# Patient Record
Sex: Male | Born: 1960
Health system: Southern US, Community
[De-identification: ages and names within clinical notes are randomized; demographics above are authoritative.]

## PROBLEM LIST (undated history)

## (undated) DIAGNOSIS — K449 Diaphragmatic hernia without obstruction or gangrene: Secondary | ICD-10-CM

## (undated) DIAGNOSIS — I2510 Atherosclerotic heart disease of native coronary artery without angina pectoris: Secondary | ICD-10-CM

## (undated) DIAGNOSIS — T18128A Food in esophagus causing other injury, initial encounter: Secondary | ICD-10-CM

## (undated) DIAGNOSIS — J189 Pneumonia, unspecified organism: Secondary | ICD-10-CM

## (undated) DIAGNOSIS — Z9889 Other specified postprocedural states: Secondary | ICD-10-CM

## (undated) DIAGNOSIS — I350 Nonrheumatic aortic (valve) stenosis: Secondary | ICD-10-CM

## (undated) DIAGNOSIS — I712 Thoracic aortic aneurysm, without rupture, unspecified: Secondary | ICD-10-CM

## (undated) DIAGNOSIS — K219 Gastro-esophageal reflux disease without esophagitis: Secondary | ICD-10-CM

## (undated) DIAGNOSIS — R112 Nausea with vomiting, unspecified: Secondary | ICD-10-CM

## (undated) HISTORY — PX: MULTIPLE TOOTH EXTRACTIONS: SHX2053

## (undated) HISTORY — PX: CHOLECYSTECTOMY: SHX55

---

## 2016-02-24 DIAGNOSIS — R21 Rash and other nonspecific skin eruption: Secondary | ICD-10-CM | POA: Diagnosis not present

## 2016-02-29 DIAGNOSIS — E669 Obesity, unspecified: Secondary | ICD-10-CM | POA: Diagnosis not present

## 2016-02-29 DIAGNOSIS — Z23 Encounter for immunization: Secondary | ICD-10-CM | POA: Diagnosis not present

## 2016-02-29 DIAGNOSIS — S81801S Unspecified open wound, right lower leg, sequela: Secondary | ICD-10-CM | POA: Diagnosis not present

## 2016-02-29 DIAGNOSIS — I358 Other nonrheumatic aortic valve disorders: Secondary | ICD-10-CM | POA: Diagnosis not present

## 2016-02-29 DIAGNOSIS — I8393 Asymptomatic varicose veins of bilateral lower extremities: Secondary | ICD-10-CM | POA: Diagnosis not present

## 2016-02-29 DIAGNOSIS — Z1389 Encounter for screening for other disorder: Secondary | ICD-10-CM | POA: Diagnosis not present

## 2016-03-03 DIAGNOSIS — I10 Essential (primary) hypertension: Secondary | ICD-10-CM | POA: Diagnosis not present

## 2016-03-10 DIAGNOSIS — R011 Cardiac murmur, unspecified: Secondary | ICD-10-CM | POA: Diagnosis not present

## 2016-03-10 DIAGNOSIS — I517 Cardiomegaly: Secondary | ICD-10-CM | POA: Diagnosis not present

## 2016-03-10 DIAGNOSIS — I082 Rheumatic disorders of both aortic and tricuspid valves: Secondary | ICD-10-CM | POA: Diagnosis not present

## 2016-05-15 DIAGNOSIS — R822 Biliuria: Secondary | ICD-10-CM | POA: Diagnosis not present

## 2016-05-15 DIAGNOSIS — I350 Nonrheumatic aortic (valve) stenosis: Secondary | ICD-10-CM | POA: Diagnosis not present

## 2016-05-15 DIAGNOSIS — R1011 Right upper quadrant pain: Secondary | ICD-10-CM | POA: Diagnosis not present

## 2016-05-15 DIAGNOSIS — Z6834 Body mass index (BMI) 34.0-34.9, adult: Secondary | ICD-10-CM | POA: Diagnosis not present

## 2016-05-22 DIAGNOSIS — K8020 Calculus of gallbladder without cholecystitis without obstruction: Secondary | ICD-10-CM | POA: Diagnosis not present

## 2016-05-22 DIAGNOSIS — K8080 Other cholelithiasis without obstruction: Secondary | ICD-10-CM | POA: Diagnosis not present

## 2016-05-22 DIAGNOSIS — N200 Calculus of kidney: Secondary | ICD-10-CM | POA: Diagnosis not present

## 2016-05-30 DIAGNOSIS — K8020 Calculus of gallbladder without cholecystitis without obstruction: Secondary | ICD-10-CM | POA: Diagnosis not present

## 2016-06-15 DIAGNOSIS — Z87891 Personal history of nicotine dependence: Secondary | ICD-10-CM

## 2016-06-15 DIAGNOSIS — F17210 Nicotine dependence, cigarettes, uncomplicated: Secondary | ICD-10-CM | POA: Diagnosis not present

## 2016-06-15 DIAGNOSIS — I350 Nonrheumatic aortic (valve) stenosis: Secondary | ICD-10-CM | POA: Diagnosis not present

## 2016-06-15 HISTORY — DX: Personal history of nicotine dependence: Z87.891

## 2016-06-16 DIAGNOSIS — Z01818 Encounter for other preprocedural examination: Secondary | ICD-10-CM | POA: Diagnosis not present

## 2016-06-16 DIAGNOSIS — F17210 Nicotine dependence, cigarettes, uncomplicated: Secondary | ICD-10-CM | POA: Diagnosis not present

## 2016-06-16 DIAGNOSIS — R079 Chest pain, unspecified: Secondary | ICD-10-CM | POA: Diagnosis not present

## 2016-06-16 DIAGNOSIS — I350 Nonrheumatic aortic (valve) stenosis: Secondary | ICD-10-CM | POA: Diagnosis not present

## 2016-06-19 DIAGNOSIS — R918 Other nonspecific abnormal finding of lung field: Secondary | ICD-10-CM | POA: Diagnosis not present

## 2016-06-19 DIAGNOSIS — I5020 Unspecified systolic (congestive) heart failure: Secondary | ICD-10-CM | POA: Diagnosis not present

## 2016-06-19 DIAGNOSIS — I350 Nonrheumatic aortic (valve) stenosis: Secondary | ICD-10-CM | POA: Diagnosis not present

## 2016-06-19 DIAGNOSIS — I517 Cardiomegaly: Secondary | ICD-10-CM | POA: Diagnosis not present

## 2016-06-19 DIAGNOSIS — Z720 Tobacco use: Secondary | ICD-10-CM | POA: Diagnosis not present

## 2016-06-19 DIAGNOSIS — K8060 Calculus of gallbladder and bile duct with cholecystitis, unspecified, without obstruction: Secondary | ICD-10-CM | POA: Diagnosis not present

## 2016-06-19 DIAGNOSIS — I2510 Atherosclerotic heart disease of native coronary artery without angina pectoris: Secondary | ICD-10-CM | POA: Diagnosis not present

## 2016-06-19 DIAGNOSIS — F17210 Nicotine dependence, cigarettes, uncomplicated: Secondary | ICD-10-CM | POA: Diagnosis not present

## 2016-06-19 DIAGNOSIS — Z79899 Other long term (current) drug therapy: Secondary | ICD-10-CM | POA: Diagnosis not present

## 2016-06-20 DIAGNOSIS — I2510 Atherosclerotic heart disease of native coronary artery without angina pectoris: Secondary | ICD-10-CM | POA: Diagnosis not present

## 2016-06-20 DIAGNOSIS — K810 Acute cholecystitis: Secondary | ICD-10-CM | POA: Diagnosis not present

## 2016-06-20 DIAGNOSIS — F17210 Nicotine dependence, cigarettes, uncomplicated: Secondary | ICD-10-CM | POA: Diagnosis not present

## 2016-06-20 DIAGNOSIS — I350 Nonrheumatic aortic (valve) stenosis: Secondary | ICD-10-CM | POA: Diagnosis not present

## 2016-06-22 DIAGNOSIS — R1013 Epigastric pain: Secondary | ICD-10-CM | POA: Diagnosis not present

## 2016-06-22 DIAGNOSIS — R1011 Right upper quadrant pain: Secondary | ICD-10-CM | POA: Diagnosis not present

## 2016-06-22 DIAGNOSIS — K8010 Calculus of gallbladder with chronic cholecystitis without obstruction: Secondary | ICD-10-CM | POA: Diagnosis not present

## 2016-06-22 DIAGNOSIS — I350 Nonrheumatic aortic (valve) stenosis: Secondary | ICD-10-CM | POA: Diagnosis not present

## 2016-06-23 DIAGNOSIS — I350 Nonrheumatic aortic (valve) stenosis: Secondary | ICD-10-CM | POA: Diagnosis not present

## 2016-06-23 DIAGNOSIS — K810 Acute cholecystitis: Secondary | ICD-10-CM | POA: Diagnosis not present

## 2016-06-23 DIAGNOSIS — K8010 Calculus of gallbladder with chronic cholecystitis without obstruction: Secondary | ICD-10-CM | POA: Diagnosis not present

## 2016-06-23 DIAGNOSIS — K219 Gastro-esophageal reflux disease without esophagitis: Secondary | ICD-10-CM | POA: Diagnosis not present

## 2016-06-23 DIAGNOSIS — K811 Chronic cholecystitis: Secondary | ICD-10-CM | POA: Diagnosis not present

## 2016-06-23 DIAGNOSIS — F17210 Nicotine dependence, cigarettes, uncomplicated: Secondary | ICD-10-CM | POA: Diagnosis not present

## 2016-06-23 DIAGNOSIS — I2510 Atherosclerotic heart disease of native coronary artery without angina pectoris: Secondary | ICD-10-CM | POA: Diagnosis not present

## 2016-07-19 DIAGNOSIS — Z6833 Body mass index (BMI) 33.0-33.9, adult: Secondary | ICD-10-CM | POA: Diagnosis not present

## 2016-07-19 DIAGNOSIS — F17210 Nicotine dependence, cigarettes, uncomplicated: Secondary | ICD-10-CM | POA: Diagnosis not present

## 2016-07-19 DIAGNOSIS — I2510 Atherosclerotic heart disease of native coronary artery without angina pectoris: Secondary | ICD-10-CM | POA: Diagnosis not present

## 2016-07-19 DIAGNOSIS — I350 Nonrheumatic aortic (valve) stenosis: Secondary | ICD-10-CM | POA: Diagnosis not present

## 2016-07-26 DIAGNOSIS — I350 Nonrheumatic aortic (valve) stenosis: Secondary | ICD-10-CM | POA: Diagnosis not present

## 2016-08-09 DIAGNOSIS — I25118 Atherosclerotic heart disease of native coronary artery with other forms of angina pectoris: Secondary | ICD-10-CM | POA: Diagnosis not present

## 2016-08-09 DIAGNOSIS — I350 Nonrheumatic aortic (valve) stenosis: Secondary | ICD-10-CM | POA: Diagnosis not present

## 2016-08-09 DIAGNOSIS — F17210 Nicotine dependence, cigarettes, uncomplicated: Secondary | ICD-10-CM | POA: Diagnosis not present

## 2016-08-09 DIAGNOSIS — Z434 Encounter for attention to other artificial openings of digestive tract: Secondary | ICD-10-CM | POA: Diagnosis not present

## 2016-08-09 DIAGNOSIS — Z6834 Body mass index (BMI) 34.0-34.9, adult: Secondary | ICD-10-CM | POA: Diagnosis not present

## 2016-08-09 DIAGNOSIS — Z7289 Other problems related to lifestyle: Secondary | ICD-10-CM | POA: Diagnosis not present

## 2016-08-09 DIAGNOSIS — K810 Acute cholecystitis: Secondary | ICD-10-CM | POA: Diagnosis not present

## 2016-08-15 DIAGNOSIS — Z01818 Encounter for other preprocedural examination: Secondary | ICD-10-CM | POA: Diagnosis not present

## 2016-08-15 DIAGNOSIS — I25118 Atherosclerotic heart disease of native coronary artery with other forms of angina pectoris: Secondary | ICD-10-CM | POA: Diagnosis not present

## 2016-08-17 DIAGNOSIS — F17210 Nicotine dependence, cigarettes, uncomplicated: Secondary | ICD-10-CM | POA: Diagnosis not present

## 2016-08-17 DIAGNOSIS — I25118 Atherosclerotic heart disease of native coronary artery with other forms of angina pectoris: Secondary | ICD-10-CM | POA: Diagnosis not present

## 2016-08-17 DIAGNOSIS — K219 Gastro-esophageal reflux disease without esophagitis: Secondary | ICD-10-CM | POA: Diagnosis not present

## 2016-08-17 DIAGNOSIS — I350 Nonrheumatic aortic (valve) stenosis: Secondary | ICD-10-CM | POA: Diagnosis not present

## 2016-08-17 DIAGNOSIS — K810 Acute cholecystitis: Secondary | ICD-10-CM | POA: Diagnosis not present

## 2016-08-17 DIAGNOSIS — I444 Left anterior fascicular block: Secondary | ICD-10-CM | POA: Diagnosis not present

## 2016-08-18 DIAGNOSIS — K219 Gastro-esophageal reflux disease without esophagitis: Secondary | ICD-10-CM | POA: Diagnosis not present

## 2016-08-18 DIAGNOSIS — K810 Acute cholecystitis: Secondary | ICD-10-CM | POA: Diagnosis not present

## 2016-08-18 DIAGNOSIS — I25118 Atherosclerotic heart disease of native coronary artery with other forms of angina pectoris: Secondary | ICD-10-CM | POA: Diagnosis not present

## 2016-08-18 DIAGNOSIS — F17210 Nicotine dependence, cigarettes, uncomplicated: Secondary | ICD-10-CM | POA: Diagnosis not present

## 2016-08-18 DIAGNOSIS — I350 Nonrheumatic aortic (valve) stenosis: Secondary | ICD-10-CM | POA: Diagnosis not present

## 2016-09-05 DIAGNOSIS — K8010 Calculus of gallbladder with chronic cholecystitis without obstruction: Secondary | ICD-10-CM | POA: Diagnosis not present

## 2016-09-05 DIAGNOSIS — K811 Chronic cholecystitis: Secondary | ICD-10-CM | POA: Diagnosis not present

## 2016-09-05 DIAGNOSIS — R1011 Right upper quadrant pain: Secondary | ICD-10-CM | POA: Diagnosis not present

## 2016-09-05 DIAGNOSIS — R1013 Epigastric pain: Secondary | ICD-10-CM | POA: Diagnosis not present

## 2016-09-06 DIAGNOSIS — K819 Cholecystitis, unspecified: Secondary | ICD-10-CM | POA: Diagnosis not present

## 2016-09-06 DIAGNOSIS — F17210 Nicotine dependence, cigarettes, uncomplicated: Secondary | ICD-10-CM | POA: Diagnosis not present

## 2016-09-06 DIAGNOSIS — I2510 Atherosclerotic heart disease of native coronary artery without angina pectoris: Secondary | ICD-10-CM | POA: Diagnosis not present

## 2016-09-06 DIAGNOSIS — I350 Nonrheumatic aortic (valve) stenosis: Secondary | ICD-10-CM | POA: Diagnosis not present

## 2016-09-08 DIAGNOSIS — T85590A Other mechanical complication of bile duct prosthesis, initial encounter: Secondary | ICD-10-CM | POA: Diagnosis not present

## 2016-09-08 DIAGNOSIS — K811 Chronic cholecystitis: Secondary | ICD-10-CM | POA: Diagnosis not present

## 2016-09-11 DIAGNOSIS — F17200 Nicotine dependence, unspecified, uncomplicated: Secondary | ICD-10-CM | POA: Diagnosis not present

## 2016-09-11 DIAGNOSIS — I358 Other nonrheumatic aortic valve disorders: Secondary | ICD-10-CM | POA: Diagnosis not present

## 2016-09-11 DIAGNOSIS — R112 Nausea with vomiting, unspecified: Secondary | ICD-10-CM | POA: Diagnosis not present

## 2016-09-11 DIAGNOSIS — R109 Unspecified abdominal pain: Secondary | ICD-10-CM | POA: Diagnosis not present

## 2016-09-11 DIAGNOSIS — I2510 Atherosclerotic heart disease of native coronary artery without angina pectoris: Secondary | ICD-10-CM | POA: Diagnosis not present

## 2016-09-11 DIAGNOSIS — J9811 Atelectasis: Secondary | ICD-10-CM | POA: Diagnosis not present

## 2016-09-11 DIAGNOSIS — Z7982 Long term (current) use of aspirin: Secondary | ICD-10-CM | POA: Diagnosis not present

## 2016-09-11 DIAGNOSIS — K219 Gastro-esophageal reflux disease without esophagitis: Secondary | ICD-10-CM | POA: Diagnosis not present

## 2016-09-11 DIAGNOSIS — K449 Diaphragmatic hernia without obstruction or gangrene: Secondary | ICD-10-CM | POA: Diagnosis not present

## 2016-09-11 DIAGNOSIS — J90 Pleural effusion, not elsewhere classified: Secondary | ICD-10-CM | POA: Diagnosis not present

## 2016-09-11 DIAGNOSIS — R1011 Right upper quadrant pain: Secondary | ICD-10-CM | POA: Diagnosis not present

## 2016-09-11 DIAGNOSIS — R0602 Shortness of breath: Secondary | ICD-10-CM | POA: Diagnosis not present

## 2016-09-11 DIAGNOSIS — Z955 Presence of coronary angioplasty implant and graft: Secondary | ICD-10-CM | POA: Diagnosis not present

## 2016-09-11 DIAGNOSIS — D649 Anemia, unspecified: Secondary | ICD-10-CM | POA: Diagnosis not present

## 2016-09-11 DIAGNOSIS — I700 Atherosclerosis of aorta: Secondary | ICD-10-CM | POA: Diagnosis not present

## 2016-09-14 DIAGNOSIS — K811 Chronic cholecystitis: Secondary | ICD-10-CM | POA: Diagnosis not present

## 2016-09-14 DIAGNOSIS — I350 Nonrheumatic aortic (valve) stenosis: Secondary | ICD-10-CM | POA: Diagnosis not present

## 2016-09-14 DIAGNOSIS — K429 Umbilical hernia without obstruction or gangrene: Secondary | ICD-10-CM | POA: Diagnosis not present

## 2016-09-14 DIAGNOSIS — R1013 Epigastric pain: Secondary | ICD-10-CM | POA: Diagnosis not present

## 2016-10-02 DIAGNOSIS — K819 Cholecystitis, unspecified: Secondary | ICD-10-CM | POA: Diagnosis not present

## 2016-10-02 DIAGNOSIS — K8050 Calculus of bile duct without cholangitis or cholecystitis without obstruction: Secondary | ICD-10-CM | POA: Diagnosis not present

## 2016-10-02 DIAGNOSIS — K8066 Calculus of gallbladder and bile duct with acute and chronic cholecystitis without obstruction: Secondary | ICD-10-CM | POA: Diagnosis not present

## 2016-10-02 DIAGNOSIS — Z951 Presence of aortocoronary bypass graft: Secondary | ICD-10-CM | POA: Diagnosis not present

## 2016-10-02 DIAGNOSIS — Z955 Presence of coronary angioplasty implant and graft: Secondary | ICD-10-CM | POA: Diagnosis not present

## 2016-10-02 DIAGNOSIS — K810 Acute cholecystitis: Secondary | ICD-10-CM | POA: Diagnosis not present

## 2016-10-02 DIAGNOSIS — K8010 Calculus of gallbladder with chronic cholecystitis without obstruction: Secondary | ICD-10-CM | POA: Diagnosis not present

## 2016-10-02 DIAGNOSIS — Z7902 Long term (current) use of antithrombotics/antiplatelets: Secondary | ICD-10-CM | POA: Diagnosis not present

## 2016-10-02 DIAGNOSIS — I2510 Atherosclerotic heart disease of native coronary artery without angina pectoris: Secondary | ICD-10-CM | POA: Diagnosis not present

## 2016-10-02 DIAGNOSIS — R9431 Abnormal electrocardiogram [ECG] [EKG]: Secondary | ICD-10-CM | POA: Diagnosis not present

## 2016-10-02 DIAGNOSIS — K8060 Calculus of gallbladder and bile duct with cholecystitis, unspecified, without obstruction: Secondary | ICD-10-CM | POA: Diagnosis not present

## 2016-10-02 DIAGNOSIS — I350 Nonrheumatic aortic (valve) stenosis: Secondary | ICD-10-CM | POA: Diagnosis not present

## 2016-10-02 DIAGNOSIS — Z716 Tobacco abuse counseling: Secondary | ICD-10-CM | POA: Diagnosis not present

## 2016-10-02 DIAGNOSIS — K219 Gastro-esophageal reflux disease without esophagitis: Secondary | ICD-10-CM | POA: Diagnosis not present

## 2016-10-02 DIAGNOSIS — F17210 Nicotine dependence, cigarettes, uncomplicated: Secondary | ICD-10-CM | POA: Diagnosis not present

## 2016-10-02 DIAGNOSIS — Z7982 Long term (current) use of aspirin: Secondary | ICD-10-CM | POA: Diagnosis not present

## 2016-10-03 DIAGNOSIS — Z955 Presence of coronary angioplasty implant and graft: Secondary | ICD-10-CM | POA: Diagnosis not present

## 2016-10-03 DIAGNOSIS — I350 Nonrheumatic aortic (valve) stenosis: Secondary | ICD-10-CM | POA: Diagnosis not present

## 2016-10-03 DIAGNOSIS — K819 Cholecystitis, unspecified: Secondary | ICD-10-CM | POA: Diagnosis not present

## 2016-10-03 DIAGNOSIS — F17210 Nicotine dependence, cigarettes, uncomplicated: Secondary | ICD-10-CM | POA: Diagnosis not present

## 2016-10-03 DIAGNOSIS — I2510 Atherosclerotic heart disease of native coronary artery without angina pectoris: Secondary | ICD-10-CM | POA: Diagnosis not present

## 2016-10-03 DIAGNOSIS — K8066 Calculus of gallbladder and bile duct with acute and chronic cholecystitis without obstruction: Secondary | ICD-10-CM | POA: Diagnosis not present

## 2016-10-04 DIAGNOSIS — I350 Nonrheumatic aortic (valve) stenosis: Secondary | ICD-10-CM | POA: Diagnosis not present

## 2016-10-04 DIAGNOSIS — I2510 Atherosclerotic heart disease of native coronary artery without angina pectoris: Secondary | ICD-10-CM | POA: Diagnosis not present

## 2016-10-04 DIAGNOSIS — F17210 Nicotine dependence, cigarettes, uncomplicated: Secondary | ICD-10-CM | POA: Diagnosis not present

## 2016-10-04 DIAGNOSIS — K819 Cholecystitis, unspecified: Secondary | ICD-10-CM | POA: Diagnosis not present

## 2016-10-24 DIAGNOSIS — K810 Acute cholecystitis: Secondary | ICD-10-CM | POA: Diagnosis not present

## 2016-10-27 DIAGNOSIS — K8040 Calculus of bile duct with cholecystitis, unspecified, without obstruction: Secondary | ICD-10-CM | POA: Diagnosis not present

## 2016-11-01 DIAGNOSIS — J90 Pleural effusion, not elsewhere classified: Secondary | ICD-10-CM | POA: Diagnosis not present

## 2016-11-01 DIAGNOSIS — K8050 Calculus of bile duct without cholangitis or cholecystitis without obstruction: Secondary | ICD-10-CM | POA: Diagnosis not present

## 2016-11-01 DIAGNOSIS — K8040 Calculus of bile duct with cholecystitis, unspecified, without obstruction: Secondary | ICD-10-CM | POA: Diagnosis not present

## 2016-11-01 DIAGNOSIS — M4185 Other forms of scoliosis, thoracolumbar region: Secondary | ICD-10-CM | POA: Diagnosis not present

## 2016-11-13 DIAGNOSIS — I350 Nonrheumatic aortic (valve) stenosis: Secondary | ICD-10-CM | POA: Diagnosis not present

## 2016-11-13 DIAGNOSIS — J189 Pneumonia, unspecified organism: Secondary | ICD-10-CM | POA: Diagnosis not present

## 2016-11-13 DIAGNOSIS — Z6832 Body mass index (BMI) 32.0-32.9, adult: Secondary | ICD-10-CM | POA: Diagnosis not present

## 2017-09-26 ENCOUNTER — Telehealth: Payer: Self-pay | Admitting: Cardiology

## 2017-09-26 ENCOUNTER — Other Ambulatory Visit: Payer: Self-pay

## 2017-09-26 MED ORDER — CLOPIDOGREL BISULFATE 75 MG PO TABS: 75.0000 mg | ORAL_TABLET | Freq: Every day | ORAL | 0 refills | Status: DC

## 2017-09-26 MED ORDER — CLOPIDOGREL BISULFATE 75 MG PO TABS: 75.0000 mg | ORAL_TABLET | Freq: Every day | ORAL | 1 refills | Status: DC

## 2017-09-26 NOTE — Telephone Encounter (Signed)
Patient was last seen over a year ago but needs refills. He needs his metoprolol 25mg  and clopidrogel 75mg  filled at [facility] in [place]. He has an appt set up for 2/20

## 2017-09-26 NOTE — Telephone Encounter (Signed)
Med refill sent

## 2017-09-27 ENCOUNTER — Other Ambulatory Visit: Payer: Self-pay

## 2017-09-27 MED ORDER — METOPROLOL TARTRATE 25 MG PO TABS: 25.0000 mg | ORAL_TABLET | Freq: Two times a day (BID) | ORAL | 1 refills | Status: DC

## 2017-10-03 ENCOUNTER — Other Ambulatory Visit: Payer: Self-pay

## 2017-10-03 ENCOUNTER — Ambulatory Visit (INDEPENDENT_AMBULATORY_CARE_PROVIDER_SITE_OTHER): Payer: BLUE CROSS/BLUE SHIELD | Admitting: Cardiology

## 2017-10-03 ENCOUNTER — Encounter: Payer: Self-pay | Admitting: Cardiology

## 2017-10-03 VITALS — BP 124/82 | HR 76 | Ht 70.0 in | Wt 232.0 lb

## 2017-10-03 DIAGNOSIS — I712 Thoracic aortic aneurysm, without rupture, unspecified: Secondary | ICD-10-CM

## 2017-10-03 DIAGNOSIS — I2510 Atherosclerotic heart disease of native coronary artery without angina pectoris: Secondary | ICD-10-CM

## 2017-10-03 DIAGNOSIS — I350 Nonrheumatic aortic (valve) stenosis: Secondary | ICD-10-CM

## 2017-10-03 DIAGNOSIS — K219 Gastro-esophageal reflux disease without esophagitis: Secondary | ICD-10-CM | POA: Insufficient documentation

## 2017-10-03 DIAGNOSIS — F17210 Nicotine dependence, cigarettes, uncomplicated: Secondary | ICD-10-CM | POA: Diagnosis not present

## 2017-10-03 DIAGNOSIS — K819 Cholecystitis, unspecified: Secondary | ICD-10-CM | POA: Insufficient documentation

## 2017-10-03 MED ORDER — OMEPRAZOLE 40 MG PO CPDR: 40.0000 mg | DELAYED_RELEASE_CAPSULE | Freq: Every day | ORAL | 0 refills | Status: DC

## 2017-10-03 NOTE — Patient Instructions (Signed)
Medication Instructions:  Your physician recommends that you continue on your current medications as directed. Please refer to the Current Medication list given to you today.  Labwork: Your physician recommends that you return for lab work in: same day as your echo. Come fasting. BMP, CBC, TSH, LFT, Lipid  Testing/Procedures: You had an EKG today.  Your physician has requested that you have an echocardiogram. Echocardiography is a painless test that uses sound waves to create images of your heart. It provides your doctor with information about the size and shape of your heart and how well your heart's chambers and valves are working. This procedure takes approximately one hour. There are no restrictions for this procedure.  Non-Cardiac CT scanning, (CAT scanning), is a noninvasive, special x-ray that produces cross-sectional images of the body using x-rays and a computer. CT scans help physicians diagnose and treat medical conditions. For some CT exams, a contrast material is used to enhance visibility in the area of the body being studied. CT scans provide greater clarity and reveal more details than regular x-ray exams.  Follow-Up: Your physician recommends that you schedule a follow-up appointment in: 1 month.  Any Other Special Instructions Will Be Listed Below (If Applicable).     If you need a refill on your cardiac medications before your next appointment, please call your pharmacy.

## 2017-10-03 NOTE — Progress Notes (Signed)
Cardiology Office Note:    Date:  10/03/2017   ID:  Tyler RichterDavid Kemp, DOB 03/06/1961, MRN [id number]  PCP:  P[facility]  Cardiologist:  Garwin Brothersajan R Tryston Gilliam, MD   Referring MD: [facility], [facility]    ASSESSMENT:    1. Thoracic aortic aneurysm without rupture (HCC)   2. Coronary artery disease involving native coronary artery of native heart without angina pectoris   3. Cigarette smoker   4. Severe aortic stenosis    PLAN:    In order of problems listed above:  1. Secondary prevention stressed with the patient.  Importance of compliance with diet and medications stressed and he vocalized understanding.  Compliance with follow-up visits first. 2. It is unclear to me as to what his lipids are fine we will check him when he comes back in the next few days fasting. 3. Clinically it appears his aortic stenosis is asymptomatic however I am not sure how much to rely on his history and therefore we will obtain an echocardiogram and get another assessment of the aortic valve. 4. I would also do a CT scan 12 to again redefine aortic pathology of the aneurysm and its issues so we can manage that appropriately. 5. It is unclear to me at this time but is not on statin therapy and he needs it from aneurysm and CAD standpoint. 6. He will be seen in follow-up appointment in a month or earlier if he has any concerns.  I told him to establish with primary care physician and he is agreeable.   Medication Adjustments/Labs and Tests Ordered: Current medicines are reviewed at length with the patient today.  Concerns regarding medicines are outlined above.  Orders Placed This Encounter  Procedures  . Basic metabolic panel  . CBC  . TSH  . Hepatic function panel  . Lipid Profile  . ECHOCARDIOGRAM COMPLETE   Meds ordered this encounter  Medications  . omeprazole (PRILOSEC) 40 MG capsule    Sig: Take 1 capsule (40 mg total) by mouth daily.    Dispense:  30 capsule    Refill:  0       History of Present Illness:    Tyler Kemp is a 57 y.o. male who is being seen today for the evaluation of aortic stenosis.  The patient has significant aortic stenosis and coronary artery disease.  In December 27 he underwent coronary stenting.  Subsequently he is done fine.  He leads a sedentary lifestyle.  No chest pain orthopnea or PND.  No chest pain or syncope or any shortness of breath on exertion.  He denies any chest pain.  No anginal-like symptoms.  At the time of my evaluation, the patient is alert awake oriented and in no distress.  He has not seen a doctor for greater than 14 months.  History reviewed. No pertinent past medical history.  History reviewed. No pertinent surgical history.  Current Medications: Current Meds  Medication Sig  . aspirin 81 MG chewable tablet   . clopidogrel (PLAVIX) 75 MG tablet Take 1 tablet (75 mg total) by mouth daily.  . metoprolol succinate (TOPROL-XL) 25 MG 24 hr tablet   . metoprolol tartrate (LOPRESSOR) 25 MG tablet Take 1 tablet (25 mg total) by mouth 2 (two) times daily.  Marland Kitchen. omeprazole (PRILOSEC) 40 MG capsule Take 1 capsule (40 mg total) by mouth daily.  . [DISCONTINUED] omeprazole (PRILOSEC) 40 MG capsule Take 40 mg by mouth.     Allergies:   Patient has no  known allergies.   Social History   Socioeconomic History  . Marital status: Unknown    Spouse name: None  . Number of children: None  . Years of education: None  . Highest education level: None  Social Needs  . Financial resource strain: None  . Food insecurity - worry: None  . Food insecurity - inability: None  . Transportation needs - medical: None  . Transportation needs - non-medical: None  Occupational History  . None  Tobacco Use  . Smoking status: Current Some Day Smoker  . Smokeless tobacco: Current User  Substance and Sexual Activity  . Alcohol use: None  . Drug use: None  . Sexual activity: None  Other Topics Concern  . None  Social History  Narrative  . None     Family History: The patient's family history is not on file.  ROS:   Please see the history of present illness.    All other systems reviewed and are negative.  EKGs/Labs/Other Studies Reviewed:    The following studies were reviewed today: I discussed my findings with the patient at extensive length.  EKG reveals sinus rhythm with ST changes   Recent Labs: No results found for requested labs within last 8760 hours.  Recent Lipid Panel No results found for: CHOL, TRIG, HDL, CHOLHDL, VLDL, LDLCALC, LDLDIRECT  Physical Exam:    VS:  BP 124/82 (BP Location: Right Arm, Patient Position: Sitting, Cuff Size: Normal)   Pulse 76   Ht 5\' 10"  (1.778 m)   Wt 232 lb (105.2 kg)   SpO2 99%   BMI 33.29 kg/m     Wt Readings from Last 3 Encounters:  10/03/17 232 lb (105.2 kg)     GEN: Patient is in no acute distress HEENT: Normal NECK: No JVD; No carotid bruits LYMPHATICS: No lymphadenopathy CARDIAC: S1 S2 regular, 2/6 systolic murmur at the apex and 3/6 systolic murmur at the aortic area. RESPIRATORY:  Clear to auscultation without rales, wheezing or rhonchi  ABDOMEN: Soft, non-tender, non-distended MUSCULOSKELETAL:  No edema; No deformity  SKIN: Warm and dry NEUROLOGIC:  Alert and oriented x 3 PSYCHIATRIC:  Normal affect    Signed, Garwin Brothers, MD  10/03/2017 3:39 PM    [facility]

## 2017-10-05 ENCOUNTER — Telehealth: Payer: Self-pay

## 2017-10-05 NOTE — Telephone Encounter (Signed)
Informed patient to have his blood work completed 3-4 days before chest CT to ensure proper kidney function. Patient was agreeable.

## 2017-10-30 ENCOUNTER — Ambulatory Visit (HOSPITAL_BASED_OUTPATIENT_CLINIC_OR_DEPARTMENT_OTHER): Admission: RE | Admit: 2017-10-30 | Payer: BLUE CROSS/BLUE SHIELD | Source: Ambulatory Visit

## 2017-10-30 ENCOUNTER — Ambulatory Visit (HOSPITAL_BASED_OUTPATIENT_CLINIC_OR_DEPARTMENT_OTHER): Payer: BLUE CROSS/BLUE SHIELD

## 2017-11-20 DIAGNOSIS — I350 Nonrheumatic aortic (valve) stenosis: Secondary | ICD-10-CM | POA: Diagnosis not present

## 2017-11-20 DIAGNOSIS — I712 Thoracic aortic aneurysm, without rupture: Secondary | ICD-10-CM | POA: Diagnosis not present

## 2017-11-20 DIAGNOSIS — I2510 Atherosclerotic heart disease of native coronary artery without angina pectoris: Secondary | ICD-10-CM | POA: Diagnosis not present

## 2017-11-21 LAB — BASIC METABOLIC PANEL
BUN / CREAT RATIO: 18 (ref 9–20)
BUN: 18 mg/dL (ref 6–24)
CO2: 23 mmol/L (ref 20–29)
CREATININE: 1.01 mg/dL (ref 0.76–1.27)
Calcium: 9.7 mg/dL (ref 8.7–10.2)
Chloride: 99 mmol/L (ref 96–106)
GFR calc Af Amer: 94 mL/min/{1.73_m2} (ref >59)
GFR, EST NON AFRICAN AMERICAN: 82 mL/min/{1.73_m2} (ref >59)
GLUCOSE: 76 mg/dL (ref 65–99)
POTASSIUM: 4.9 mmol/L (ref 3.5–5.2)
SODIUM: 137 mmol/L (ref 134–144)

## 2017-11-21 LAB — CBC
Hematocrit: 41.3 % (ref 37.5–51.0)
Hemoglobin: 13.6 g/dL (ref 13.0–17.7)
MCH: 28.5 pg (ref 26.6–33.0)
MCHC: 32.9 g/dL (ref 31.5–35.7)
MCV: 86 fL (ref 79–97)
PLATELETS: 203 10*3/uL (ref 150–379)
RBC: 4.78 x10E6/uL (ref 4.14–5.80)
RDW: 14.1 % (ref 12.3–15.4)
WBC: 8.3 10*3/uL (ref 3.4–10.8)

## 2017-11-21 LAB — HEPATIC FUNCTION PANEL
ALK PHOS: 66 IU/L (ref 39–117)
ALT: 21 IU/L (ref 0–44)
AST: 25 IU/L (ref 0–40)
Albumin: 4.3 g/dL (ref 3.5–5.5)
BILIRUBIN TOTAL: 0.3 mg/dL (ref 0.0–1.2)
BILIRUBIN, DIRECT: 0.1 mg/dL (ref 0.00–0.40)
TOTAL PROTEIN: 6.9 g/dL (ref 6.0–8.5)

## 2017-11-21 LAB — LIPID PANEL
CHOLESTEROL TOTAL: 223 mg/dL — AB (ref 100–199)
Chol/HDL Ratio: 4.2 ratio (ref 0.0–5.0)
HDL: 53 mg/dL (ref >39)
LDL CALC: 118 mg/dL — AB (ref 0–99)
Triglycerides: 260 mg/dL — ABNORMAL HIGH (ref 0–149)
VLDL Cholesterol Cal: 52 mg/dL — ABNORMAL HIGH (ref 5–40)

## 2017-11-21 LAB — TSH: TSH: 1.89 u[IU]/mL (ref 0.450–4.500)

## 2017-11-22 ENCOUNTER — Ambulatory Visit (HOSPITAL_BASED_OUTPATIENT_CLINIC_OR_DEPARTMENT_OTHER)
Admission: RE | Admit: 2017-11-22 | Discharge: 2017-11-22 | Disposition: A | Discharge status: Home / Self Care | Payer: BLUE CROSS/BLUE SHIELD | Source: Ambulatory Visit | Attending: Cardiology | Admitting: Cardiology

## 2017-11-22 ENCOUNTER — Encounter (HOSPITAL_BASED_OUTPATIENT_CLINIC_OR_DEPARTMENT_OTHER): Payer: Self-pay

## 2017-11-22 DIAGNOSIS — I350 Nonrheumatic aortic (valve) stenosis: Secondary | ICD-10-CM | POA: Diagnosis not present

## 2017-11-22 DIAGNOSIS — I700 Atherosclerosis of aorta: Secondary | ICD-10-CM | POA: Insufficient documentation

## 2017-11-22 DIAGNOSIS — K449 Diaphragmatic hernia without obstruction or gangrene: Secondary | ICD-10-CM | POA: Diagnosis not present

## 2017-11-22 DIAGNOSIS — I2510 Atherosclerotic heart disease of native coronary artery without angina pectoris: Secondary | ICD-10-CM

## 2017-11-22 DIAGNOSIS — I712 Thoracic aortic aneurysm, without rupture, unspecified: Secondary | ICD-10-CM

## 2017-11-22 DIAGNOSIS — Z720 Tobacco use: Secondary | ICD-10-CM | POA: Diagnosis not present

## 2017-11-22 MED ORDER — IOPAMIDOL (ISOVUE-370) INJECTION 76%
100.0000 mL | Freq: Once | INTRAVENOUS | Status: AC | PRN
  Administered 2017-11-22: 100 mL via INTRAVENOUS

## 2017-11-22 NOTE — Progress Notes (Signed)
  Echocardiogram 2D Echocardiogram has been performed.  Dorothey BasemanReel, Enora Trillo M 11/22/2017, 3:57 PM

## 2017-11-23 ENCOUNTER — Other Ambulatory Visit: Payer: Self-pay

## 2017-11-23 DIAGNOSIS — I2510 Atherosclerotic heart disease of native coronary artery without angina pectoris: Secondary | ICD-10-CM

## 2017-11-23 MED ORDER — ATORVASTATIN CALCIUM 10 MG PO TABS: 10.0000 mg | ORAL_TABLET | Freq: Every day | ORAL | 2 refills | Status: DC

## 2017-11-26 ENCOUNTER — Encounter: Payer: Self-pay | Admitting: Cardiology

## 2017-11-26 ENCOUNTER — Ambulatory Visit (INDEPENDENT_AMBULATORY_CARE_PROVIDER_SITE_OTHER): Payer: BLUE CROSS/BLUE SHIELD | Admitting: Cardiology

## 2017-11-26 VITALS — BP 122/76 | HR 93 | Ht 70.0 in | Wt 230.8 lb

## 2017-11-26 DIAGNOSIS — I2510 Atherosclerotic heart disease of native coronary artery without angina pectoris: Secondary | ICD-10-CM | POA: Diagnosis not present

## 2017-11-26 DIAGNOSIS — I350 Nonrheumatic aortic (valve) stenosis: Secondary | ICD-10-CM

## 2017-11-26 DIAGNOSIS — I712 Thoracic aortic aneurysm, without rupture, unspecified: Secondary | ICD-10-CM

## 2017-11-26 DIAGNOSIS — Z01810 Encounter for preprocedural cardiovascular examination: Secondary | ICD-10-CM | POA: Diagnosis not present

## 2017-11-26 DIAGNOSIS — Z87891 Personal history of nicotine dependence: Secondary | ICD-10-CM | POA: Diagnosis not present

## 2017-11-26 NOTE — Patient Instructions (Signed)
Medication Instructions:  Your physician recommends that you continue on your current medications as directed. Please refer to the Current Medication list given to you today.  Labwork: Your physician recommends that you have the following labs drawn: BMP, CBC, pt/inr  Testing/Procedures: You had an EKG today.  Your physician has requested that you have a cardiac catheterization. Cardiac catheterization is used to diagnose and/or treat various heart conditions. Doctors may recommend this procedure for a number of different reasons. The most common reason is to evaluate chest pain. Chest pain can be a symptom of coronary artery disease (CAD), and cardiac catheterization can show whether plaque is narrowing or blocking your heart's arteries. This procedure is also used to evaluate the valves, as well as measure the blood flow and oxygen levels in different parts of your heart. For further information please visit [email or web address] Please follow instruction sheet, as given.    [facility] [address], [address] [place] [address] Dept: [phone] Loc: [phone]  Remonia RichterDavid Lubben  11/26/2017  You are scheduled for a Cardiac Catheterization on Tuesday, April 23 with Dr. Verdis PrimeHenry Smith.  1. Please arrive at the [facility] A[facility]: [address] [place], [place] [address] at 8:30 AM (two hours before your procedure to ensure your preparation). Free valet parking service is available.   Special note: Every effort is made to have your procedure done on time. Please understand that emergencies sometimes delay scheduled procedures.  2. Diet: Do not eat or drink anything after midnight prior to your procedure except sips of water to take medications.  3. Labs: None needed.  4. Medication instructions in preparation for your procedure:  On the morning of your procedure, take  your Aspirin and Plavix and any morning medicines NOT listed above.  You may use sips of water.  5. Plan for one night stay--bring personal belongings. 6. Bring a current list of your medications and current insurance cards. 7. You MUST have a responsible person to drive you home. 8. Someone MUST be with you the first 24 hours after you arrive home or your discharge will be delayed. 9. Please wear clothes that are easy to get on and off and wear slip-on shoes.  Thank you for allowing us to care for you!   --  Invasive Cardiovascular services   Follow-Up: Your physician recommends that you schedule a follow-up appointment in: 4 weeks.  Any Other Special Instructions Will Be Listed Below (If Applicable).     If you need a refill on your cardiac medications before your next appointment, please call your pharmacy.

## 2017-11-26 NOTE — Progress Notes (Signed)
Cardiology Office Note:    Date:  11/26/2017   ID:  Tyler Richteravid Filsinger, DOB 12/21/1960, MRN [id number]  PCP:  [facility]  Cardiologist:  Garwin Brothersajan R Grove Defina, MD   Referring MD: [facility]    ASSESSMENT:    1. Severe aortic stenosis   2. Thoracic aortic aneurysm without rupture (HCC)   3. Coronary artery disease involving native coronary artery of native heart without angina pectoris   4. Ex-smoker    PLAN:    In order of problems listed above:  1. Secondary prevention stressed with the patient.  Importance of compliance with diet and medications stressed and he vocalized understanding. 2. The patient has symptoms which are very significant and also has severe aortic stenosis.  He also has aortic aneurysm and he will need to be evaluated by coronary angiography and aortography.  This will be in preparation for aortic valve replacement.I discussed coronary angiography and left heart catheterization with the patient at extensive length. Procedure, benefits and potential risks were explained. Patient had multiple questions which were answered to the patient's satisfaction. Patient agreed and consented for the procedure. Further recommendations will be made based on the findings of the coronary angiography. In the interim. The patient has any significant symptoms he knows to go to the nearest emergency room. 3. The above were discussed with the patient at extensive length and questions were answered to his satisfaction.  Further recommendations will be made by our interventional colleague after the aforementioned tests are complete.   Medication Adjustments/Labs and Tests Ordered: Current medicines are reviewed at length with the patient today.  Concerns regarding medicines are outlined above.  No orders of the defined types were placed in this encounter.  No orders of the defined types were placed in this encounter.    Chief Complaint  Patient presents with   . Abnormal Echo     History of Present Illness:    Tyler Kemp is a 57 y.o. male.  The patient has been evaluated by me in the past.  He has coronary artery disease post stenting more than a year ago, the patient has aortic aneurysm and also severe aortic stenosis by recent echocardiogram.  He leads a sedentary lifestyle.  He denies any history of chest pain or shortness of breath on exertion or syncope.  Again he tries to not do much.  He however states that if he tries to do any more than usual he has some tightness in the chest.  His daughter-in-law is with him today.  At the time of my evaluation, the patient is alert awake oriented and in no distress.  History reviewed. No pertinent past medical history.  History reviewed. No pertinent surgical history.  Current Medications: Current Meds  Medication Sig  . aspirin 81 MG chewable tablet   . atorvastatin (LIPITOR) 10 MG tablet Take 1 tablet (10 mg total) by mouth daily at 6 PM.  . clopidogrel (PLAVIX) 75 MG tablet Take 1 tablet (75 mg total) by mouth daily.  . metoprolol succinate (TOPROL-XL) 25 MG 24 hr tablet   . metoprolol tartrate (LOPRESSOR) 25 MG tablet Take 1 tablet (25 mg total) by mouth 2 (two) times daily.  Marland Kitchen. omeprazole (PRILOSEC) 40 MG capsule Take 1 capsule (40 mg total) by mouth daily.     Allergies:   Patient has no known allergies.   Social History   Socioeconomic History  . Marital status: Single    Spouse name: Not on file  .  Number of children: Not on file  . Years of education: Not on file  . Highest education level: Not on file  Occupational History  . Not on file  Social Needs  . Financial resource strain: Not on file  . Food insecurity:    Worry: Not on file    Inability: Not on file  . Transportation needs:    Medical: Not on file    Non-medical: Not on file  Tobacco Use  . Smoking status: Current Some Day Smoker  . Smokeless tobacco: Current User  Substance and Sexual Activity  . Alcohol  use: Not on file  . Drug use: Not on file  . Sexual activity: Not on file  Lifestyle  . Physical activity:    Days per week: Not on file    Minutes per session: Not on file  . Stress: Not on file  Relationships  . Social connections:    Talks on phone: Not on file    Gets together: Not on file    Attends religious service: Not on file    Active member of club or organization: Not on file    Attends meetings of clubs or organizations: Not on file    Relationship status: Not on file  Other Topics Concern  . Not on file  Social History Narrative  . Not on file     Family History: The patient's family history is not on file.  ROS:   Please see the history of present illness.    All other systems reviewed and are negative.  EKGs/Labs/Other Studies Reviewed:    The following studies were reviewed today: I discussed the findings of the echo with the patient at extensive length.  EKG reveals sinus rhythm with nonspecific ST-T changes.   Recent Labs: 11/20/2017: ALT 21; BUN 18; Creatinine, Ser 1.01; Hemoglobin 13.6; Platelets 203; Potassium 4.9; Sodium 137; TSH 1.890  Recent Lipid Panel    Component Value Date/Time   CHOL 223 (H) 11/20/2017 1507   TRIG 260 (H) 11/20/2017 1507   HDL 53 11/20/2017 1507   CHOLHDL 4.2 11/20/2017 1507   LDLCALC 118 (H) 11/20/2017 1507    Physical Exam:    VS:  BP 122/76 (BP Location: Left Arm, Patient Position: Sitting, Cuff Size: Normal)   Pulse 93   Ht 5\' 10"  (1.778 m)   Wt 230 lb 12.8 oz (104.7 kg)   SpO2 94%   BMI 33.12 kg/m     Wt Readings from Last 3 Encounters:  11/26/17 230 lb 12.8 oz (104.7 kg)  10/03/17 232 lb (105.2 kg)     GEN: Patient is in no acute distress HEENT: Normal NECK: No JVD; No carotid bruits LYMPHATICS: No lymphadenopathy CARDIAC: Hear sounds regular, 2/6 systolic murmur at the apex. RESPIRATORY:  Clear to auscultation without rales, wheezing or rhonchi  ABDOMEN: Soft, non-tender,  non-distended MUSCULOSKELETAL:  No edema; No deformity  SKIN: Warm and dry NEUROLOGIC:  Alert and oriented x 3 PSYCHIATRIC:  Normal affect   Signed, Garwin Brothers, MD  11/26/2017 4:26 PM    [facility]

## 2017-11-27 LAB — BASIC METABOLIC PANEL
BUN/Creatinine Ratio: 13 (ref 9–20)
BUN: 14 mg/dL (ref 6–24)
CALCIUM: 9.4 mg/dL (ref 8.7–10.2)
CHLORIDE: 102 mmol/L (ref 96–106)
CO2: 24 mmol/L (ref 20–29)
Creatinine, Ser: 1.09 mg/dL (ref 0.76–1.27)
GFR calc non Af Amer: 74 mL/min/{1.73_m2} (ref >59)
GFR, EST AFRICAN AMERICAN: 86 mL/min/{1.73_m2} (ref >59)
Glucose: 87 mg/dL (ref 65–99)
POTASSIUM: 4.9 mmol/L (ref 3.5–5.2)
Sodium: 138 mmol/L (ref 134–144)

## 2017-11-27 LAB — CBC
HEMATOCRIT: 39.4 % (ref 37.5–51.0)
HEMOGLOBIN: 12.9 g/dL — AB (ref 13.0–17.7)
MCH: 27.8 pg (ref 26.6–33.0)
MCHC: 32.7 g/dL (ref 31.5–35.7)
MCV: 85 fL (ref 79–97)
Platelets: 210 10*3/uL (ref 150–379)
RBC: 4.64 x10E6/uL (ref 4.14–5.80)
RDW: 13.8 % (ref 12.3–15.4)
WBC: 8.1 10*3/uL (ref 3.4–10.8)

## 2017-11-27 LAB — PROTIME-INR
INR: 0.9 (ref 0.8–1.2)
PROTHROMBIN TIME: 9.6 s (ref 9.1–12.0)

## 2017-11-29 ENCOUNTER — Telehealth: Payer: Self-pay

## 2017-11-29 NOTE — Telephone Encounter (Signed)
Attempted to reach the patient; have attempted daily since the result was received. Will try again later and if no answer will leave a voicemail.

## 2017-11-29 NOTE — Telephone Encounter (Signed)
-----   Message from Garwin Brothersajan R Revankar, MD sent at 11/27/2017  8:49 AM EDT ----- The results of the study is unremarkable. Please inform patient. I will discuss in detail at next appointment. Cc  primary care/referring physician Garwin Brothersajan R Revankar, MD 11/27/2017 8:49 AM

## 2017-12-03 ENCOUNTER — Telehealth: Payer: Self-pay | Admitting: *Deleted

## 2017-12-03 NOTE — Telephone Encounter (Addendum)
[facility] for: Tuesday April 23,2019 10:30 AM Verify arrival time and place: [facility] Main Entrance A/North Tower at: 8 AM Nothing to eat or drink after midnight.  AM meds can be  taken pre-cath with sip of water including: ASA 81 mg  Plavix 75 mg  Patient has responsible person to drive home post procedure and observe patient for 24 hours  No answer at phone number listed. Voice mail not set up, do not see alternate number.

## 2017-12-04 ENCOUNTER — Ambulatory Visit (HOSPITAL_COMMUNITY)
Admission: RE | Admit: 2017-12-04 | Discharge: 2017-12-04 | Disposition: A | Discharge status: Home / Self Care | Payer: BLUE CROSS/BLUE SHIELD | Source: Ambulatory Visit | Attending: Interventional Cardiology | Admitting: Interventional Cardiology

## 2017-12-04 ENCOUNTER — Encounter (HOSPITAL_COMMUNITY): Payer: Self-pay | Admitting: Interventional Cardiology

## 2017-12-04 ENCOUNTER — Encounter (HOSPITAL_COMMUNITY)
Admission: RE | Disposition: A | Discharge status: Home / Self Care | Payer: Self-pay | Source: Ambulatory Visit | Attending: Interventional Cardiology

## 2017-12-04 DIAGNOSIS — Z955 Presence of coronary angioplasty implant and graft: Secondary | ICD-10-CM | POA: Insufficient documentation

## 2017-12-04 DIAGNOSIS — I712 Thoracic aortic aneurysm, without rupture, unspecified: Secondary | ICD-10-CM

## 2017-12-04 DIAGNOSIS — Z9889 Other specified postprocedural states: Secondary | ICD-10-CM | POA: Diagnosis not present

## 2017-12-04 DIAGNOSIS — I350 Nonrheumatic aortic (valve) stenosis: Secondary | ICD-10-CM

## 2017-12-04 DIAGNOSIS — I2510 Atherosclerotic heart disease of native coronary artery without angina pectoris: Secondary | ICD-10-CM | POA: Diagnosis present

## 2017-12-04 DIAGNOSIS — Z87891 Personal history of nicotine dependence: Secondary | ICD-10-CM

## 2017-12-04 DIAGNOSIS — F17200 Nicotine dependence, unspecified, uncomplicated: Secondary | ICD-10-CM | POA: Insufficient documentation

## 2017-12-04 HISTORY — PX: RIGHT/LEFT HEART CATH AND CORONARY ANGIOGRAPHY: CATH118266

## 2017-12-04 LAB — POCT I-STAT 3, ART BLOOD GAS (G3+)
ACID-BASE DEFICIT: 2 mmol/L (ref 0.0–2.0)
BICARBONATE: 23.2 mmol/L (ref 20.0–28.0)
O2 SAT: 99 %
PO2 ART: 135 mmHg — AB (ref 83.0–108.0)
TCO2: 24 mmol/L (ref 22–32)
pCO2 arterial: 39.3 mmHg (ref 32.0–48.0)
pH, Arterial: 7.379 (ref 7.350–7.450)

## 2017-12-04 LAB — POCT I-STAT 3, VENOUS BLOOD GAS (G3P V)
Acid-base deficit: 2 mmol/L (ref 0.0–2.0)
BICARBONATE: 24.1 mmol/L (ref 20.0–28.0)
O2 SAT: 72 %
PH VEN: 7.344 (ref 7.250–7.430)
TCO2: 25 mmol/L (ref 22–32)
pCO2, Ven: 44.3 mmHg (ref 44.0–60.0)
pO2, Ven: 40 mmHg (ref 32.0–45.0)

## 2017-12-04 SURGERY — RIGHT/LEFT HEART CATH AND CORONARY ANGIOGRAPHY
Anesthesia: LOCAL

## 2017-12-04 MED ORDER — MIDAZOLAM HCL 2 MG/2ML IJ SOLN
INTRAMUSCULAR | Status: AC
  Filled 2017-12-04: qty 2

## 2017-12-04 MED ORDER — SODIUM CHLORIDE 0.9 % WEIGHT BASED INFUSION
3.0000 mL/kg/h | INTRAVENOUS | Status: AC
  Administered 2017-12-04: 3 mL/kg/h via INTRAVENOUS

## 2017-12-04 MED ORDER — IOHEXOL 350 MG/ML SOLN
INTRAVENOUS | Status: DC | PRN
  Administered 2017-12-04: 140 mL via INTRA_ARTERIAL

## 2017-12-04 MED ORDER — MIDAZOLAM HCL 2 MG/2ML IJ SOLN
INTRAMUSCULAR | Status: DC | PRN
  Administered 2017-12-04: 1 mg via INTRAVENOUS

## 2017-12-04 MED ORDER — ASPIRIN 81 MG PO CHEW: 81.0000 mg | CHEWABLE_TABLET | ORAL | Status: DC

## 2017-12-04 MED ORDER — OXYCODONE HCL 5 MG PO TABS: 5.0000 mg | ORAL_TABLET | ORAL | Status: DC | PRN

## 2017-12-04 MED ORDER — ACETAMINOPHEN 325 MG PO TABS: 650.0000 mg | ORAL_TABLET | ORAL | Status: DC | PRN

## 2017-12-04 MED ORDER — SODIUM CHLORIDE 0.9 % IV SOLN: INTRAVENOUS | Status: DC

## 2017-12-04 MED ORDER — SODIUM CHLORIDE 0.9 % IV SOLN: 250.0000 mL | INTRAVENOUS | Status: DC | PRN

## 2017-12-04 MED ORDER — HEPARIN (PORCINE) IN NACL 1000-0.9 UT/500ML-% IV SOLN
INTRAVENOUS | Status: AC
  Filled 2017-12-04: qty 1000

## 2017-12-04 MED ORDER — FENTANYL CITRATE (PF) 100 MCG/2ML IJ SOLN
INTRAMUSCULAR | Status: DC | PRN
  Administered 2017-12-04: 25 ug via INTRAVENOUS
  Administered 2017-12-04: 50 ug via INTRAVENOUS

## 2017-12-04 MED ORDER — HEPARIN SODIUM (PORCINE) 1000 UNIT/ML IJ SOLN
INTRAMUSCULAR | Status: DC | PRN
  Administered 2017-12-04: 5000 [IU] via INTRAVENOUS

## 2017-12-04 MED ORDER — ONDANSETRON HCL 4 MG/2ML IJ SOLN: 4.0000 mg | Freq: Four times a day (QID) | INTRAMUSCULAR | Status: DC | PRN

## 2017-12-04 MED ORDER — SODIUM CHLORIDE 0.9% FLUSH: 3.0000 mL | INTRAVENOUS | Status: DC | PRN

## 2017-12-04 MED ORDER — HEPARIN (PORCINE) IN NACL 2-0.9 UNIT/ML-% IJ SOLN
INTRAMUSCULAR | Status: DC | PRN
  Administered 2017-12-04: 10 mL via INTRA_ARTERIAL

## 2017-12-04 MED ORDER — FENTANYL CITRATE (PF) 100 MCG/2ML IJ SOLN
INTRAMUSCULAR | Status: AC
  Filled 2017-12-04: qty 2

## 2017-12-04 MED ORDER — SODIUM CHLORIDE 0.9% FLUSH: 3.0000 mL | Freq: Two times a day (BID) | INTRAVENOUS | Status: DC

## 2017-12-04 MED ORDER — LIDOCAINE HCL (PF) 1 % IJ SOLN
INTRAMUSCULAR | Status: AC
  Filled 2017-12-04: qty 30

## 2017-12-04 MED ORDER — HEPARIN (PORCINE) IN NACL 2-0.9 UNITS/ML
INTRAMUSCULAR | Status: AC | PRN
  Administered 2017-12-04 (×2): 500 mL via INTRA_ARTERIAL

## 2017-12-04 MED ORDER — HEPARIN SODIUM (PORCINE) 1000 UNIT/ML IJ SOLN
INTRAMUSCULAR | Status: AC
  Filled 2017-12-04: qty 1

## 2017-12-04 MED ORDER — VERAPAMIL HCL 2.5 MG/ML IV SOLN
INTRAVENOUS | Status: AC
  Filled 2017-12-04: qty 2

## 2017-12-04 MED ORDER — LIDOCAINE HCL (PF) 1 % IJ SOLN
INTRAMUSCULAR | Status: DC | PRN
  Administered 2017-12-04: 5 mL

## 2017-12-04 MED ORDER — SODIUM CHLORIDE 0.9 % WEIGHT BASED INFUSION: 1.0000 mL/kg/h | INTRAVENOUS | Status: DC

## 2017-12-04 SURGICAL SUPPLY — 25 items
BAND ZEPHYR COMPRESS 30 LONG (HEMOSTASIS) ×2 IMPLANT
CATH BALLN WEDGE 5F 110CM (CATHETERS) ×2 IMPLANT
CATH INFINITI 5FR AL1 (CATHETERS) ×2 IMPLANT
CATH INFINITI 5FR JL5 (CATHETERS) ×2 IMPLANT
CATH INFINITI JR4 5F (CATHETERS) ×2 IMPLANT
CATH LAUNCHER 5F NOTO (CATHETERS) ×1 IMPLANT
CATH LAUNCHER 5F RADR (CATHETERS) ×1 IMPLANT
CATH LAUNCHER 6FR JR4 (CATHETERS) ×2 IMPLANT
CATH OPTITORQUE TIG 4.0 5F (CATHETERS) ×2 IMPLANT
CATHETER LAUNCHER 5F NOTO (CATHETERS) ×2
CATHETER LAUNCHER 5F RADR (CATHETERS) ×2
COVER PRB 48X5XTLSCP FOLD TPE (BAG) ×1 IMPLANT
COVER PROBE 5X48 (BAG) ×1
GLIDESHEATH SLEND A-KIT 6F 22G (SHEATH) ×2 IMPLANT
GUIDEWIRE .025 260CM (WIRE) ×2 IMPLANT
GUIDEWIRE INQWIRE 1.5J.035X260 (WIRE) ×2 IMPLANT
INQWIRE 1.5J .035X260CM (WIRE) ×4
KIT HEART LEFT (KITS) ×2 IMPLANT
NEEDLE PERC 21GX4CM (NEEDLE) ×2 IMPLANT
PACK CARDIAC CATHETERIZATION (CUSTOM PROCEDURE TRAY) ×2 IMPLANT
SHEATH GLIDE SLENDER 4/5FR (SHEATH) ×2 IMPLANT
SHEATH RAIN 4/5FR (SHEATH) ×2 IMPLANT
TRANSDUCER W/STOPCOCK (MISCELLANEOUS) ×2 IMPLANT
TUBING CIL FLEX 10 FLL-RA (TUBING) ×2 IMPLANT
WIRE EMERALD ST .035X260CM (WIRE) ×2 IMPLANT

## 2017-12-04 NOTE — Research (Signed)
CADFEM Informed Consent   Subject Name: Tyler Kemp  Subject met inclusion and exclusion criteria.  The informed consent form, study requirements and expectations were reviewed with the subject and questions and concerns were addressed prior to the signing of the consent form.  The subject verbalized understanding of the trail requirements.  The subject agreed to participate in the CADFEM trial and signed the informed consent.  The informed consent was obtained prior to performance of any protocol-specific procedures for the subject.  A copy of the signed informed consent was given to the subject and a copy was placed in the subject's medical record.  [name] 12/04/2017, 09:17 AM

## 2017-12-04 NOTE — Discharge Instructions (Signed)
Drink plenty of fluids over next 48 hours and keep right wrist heart level for 24 hours ° °Radial Site Care °Refer to this sheet in the next few weeks. These instructions provide you with information about caring for yourself after your procedure. Your health care provider may also give you more specific instructions. Your treatment has been planned according to current medical practices, but problems sometimes occur. Call your health care provider if you have any problems or questions after your procedure. °What can I expect after the procedure? °After your procedure, it is typical to have the following: °· Bruising at the radial site that usually fades within 1-2 weeks. °· Blood collecting in the tissue (hematoma) that may be painful to the touch. It should usually decrease in size and tenderness within 1-2 weeks. ° °Follow these instructions at home: °· Take medicines only as directed by your health care provider. °· You may shower 24-48 hours after the procedure or as directed by your health care provider. Remove the bandage (dressing) and gently wash the site with plain soap and water. Pat the area dry with a clean towel. Do not rub the site, because this may cause bleeding. °· Do not take baths, swim, or use a hot tub until your health care provider approves. °· Check your insertion site every day for redness, swelling, or drainage. °· Do not apply powder or lotion to the site. °· Do not flex or bend the affected arm for 24 hours or as directed by your health care provider. °· Do not push or pull heavy objects with the affected arm for 24 hours or as directed by your health care provider. °· Do not lift over 10 lb (4.5 kg) for 5 days after your procedure or as directed by your health care provider. °· Ask your health care provider when it is okay to: °? Return to work or school. °? Resume usual physical activities or sports. °? Resume sexual activity. °· Do not drive home if you are discharged the same day as  the procedure. Have someone else drive you. °· You may drive 24 hours after the procedure unless otherwise instructed by your health care provider. °· Do not operate machinery or power tools for 24 hours after the procedure. °· If your procedure was done as an outpatient procedure, which means that you went home the same day as your procedure, a responsible adult should be with you for the first 24 hours after you arrive home. °· Keep all follow-up visits as directed by your health care provider. This is important. °Contact a health care provider if: °· You have a fever. °· You have chills. °· You have increased bleeding from the radial site. Hold pressure on the site. °Get help right away if: °· You have unusual pain at the radial site. °· You have redness, warmth, or swelling at the radial site. °· You have drainage (other than a small amount of blood on the dressing) from the radial site. °· The radial site is bleeding, and the bleeding does not stop after 30 minutes of holding steady pressure on the site. °· Your arm or hand becomes pale, cool, tingly, or numb. °This information is not intended to replace advice given to you by your health care provider. Make sure you discuss any questions you have with your health care provider. °Document Released: 09/02/2010 Document Revised: 01/06/2016 Document Reviewed: 02/16/2014 °Elsevier Interactive Patient Education © 2018 [employer] Inc[employer] ° °

## 2017-12-05 MED FILL — Heparin Sod (Porcine)-NaCl IV Soln 1000 Unit/500ML-0.9%: INTRAVENOUS | Qty: 1000 | Status: AC

## 2017-12-05 NOTE — H&P (Signed)
Cath Lab Visit (complete for each Cath Lab visit)  Clinical Evaluation Leading to the Procedure:   ACS: No.  Non-ACS:    Anginal Classification: CCS III  Anti-ischemic medical therapy: Minimal Therapy (1 class of medications)  Non-Invasive Test Results: No non-invasive testing performed  Prior CABG: No previous CABG     The patient was recently seen by Dr. Normajean Baxteravankar.  He is highly symptomatic.  He has had no significant change in symptoms since the examination on 11/26/17.  Exam is compatible with aortic stenosis revealing a 3/6 crescendo decrescendo systolic murmur.  Pulses are 2+ and symmetric in the upper and lower extremities.  The procedure and risks were reviewed with the patient in detail.  We discussion of stroke, death, myocardial infarction, kidney injury, limb ischemia, bleeding, transfusion, were all discussed in detail and accepted by the patient.

## 2017-12-10 ENCOUNTER — Telehealth: Payer: Self-pay | Admitting: Cardiology

## 2017-12-10 DIAGNOSIS — Z8679 Personal history of other diseases of the circulatory system: Secondary | ICD-10-CM | POA: Diagnosis not present

## 2017-12-10 DIAGNOSIS — K449 Diaphragmatic hernia without obstruction or gangrene: Secondary | ICD-10-CM | POA: Diagnosis not present

## 2017-12-10 DIAGNOSIS — R011 Cardiac murmur, unspecified: Secondary | ICD-10-CM | POA: Diagnosis not present

## 2017-12-10 DIAGNOSIS — T18108A Unspecified foreign body in esophagus causing other injury, initial encounter: Secondary | ICD-10-CM | POA: Diagnosis not present

## 2017-12-10 DIAGNOSIS — I2510 Atherosclerotic heart disease of native coronary artery without angina pectoris: Secondary | ICD-10-CM | POA: Diagnosis not present

## 2017-12-10 DIAGNOSIS — Z9049 Acquired absence of other specified parts of digestive tract: Secondary | ICD-10-CM | POA: Diagnosis not present

## 2017-12-10 DIAGNOSIS — Z823 Family history of stroke: Secondary | ICD-10-CM | POA: Diagnosis not present

## 2017-12-10 DIAGNOSIS — Z6836 Body mass index (BMI) 36.0-36.9, adult: Secondary | ICD-10-CM | POA: Diagnosis not present

## 2017-12-10 DIAGNOSIS — Z7902 Long term (current) use of antithrombotics/antiplatelets: Secondary | ICD-10-CM | POA: Diagnosis not present

## 2017-12-10 DIAGNOSIS — I719 Aortic aneurysm of unspecified site, without rupture: Secondary | ICD-10-CM | POA: Diagnosis not present

## 2017-12-10 DIAGNOSIS — R1110 Vomiting, unspecified: Secondary | ICD-10-CM | POA: Diagnosis not present

## 2017-12-10 DIAGNOSIS — I4949 Other premature depolarization: Secondary | ICD-10-CM | POA: Diagnosis not present

## 2017-12-10 DIAGNOSIS — Z955 Presence of coronary angioplasty implant and graft: Secondary | ICD-10-CM | POA: Diagnosis not present

## 2017-12-10 DIAGNOSIS — I350 Nonrheumatic aortic (valve) stenosis: Secondary | ICD-10-CM | POA: Diagnosis not present

## 2017-12-10 DIAGNOSIS — Z87891 Personal history of nicotine dependence: Secondary | ICD-10-CM | POA: Diagnosis not present

## 2017-12-10 NOTE — Telephone Encounter (Signed)
Unable to leave voicemail will try again later

## 2017-12-10 NOTE — Telephone Encounter (Signed)
Patient's daughter in law called and has several questions post ath that patient had.  She is stating that she has not heard from anyone since cath and Dr Tomie China had mentioned that he may have to stay in the hospital for future surgery. Please call [place].

## 2017-12-10 NOTE — Telephone Encounter (Signed)
Informed [place] of the appointment date and time with Dr. Laneta Simmers. Requested that she call the office to confirm.

## 2017-12-11 ENCOUNTER — Emergency Department (HOSPITAL_COMMUNITY): Payer: BLUE CROSS/BLUE SHIELD | Admitting: Anesthesiology

## 2017-12-11 ENCOUNTER — Ambulatory Visit (HOSPITAL_COMMUNITY)
Admission: EM | Admit: 2017-12-11 | Discharge: 2017-12-11 | Disposition: A | Discharge status: Home / Self Care | Payer: BLUE CROSS/BLUE SHIELD | Attending: Emergency Medicine | Admitting: Emergency Medicine

## 2017-12-11 ENCOUNTER — Encounter (HOSPITAL_COMMUNITY)
Admission: EM | Disposition: A | Discharge status: Home / Self Care | Payer: Self-pay | Source: Home / Self Care | Attending: Emergency Medicine

## 2017-12-11 ENCOUNTER — Encounter (HOSPITAL_COMMUNITY): Payer: Self-pay | Admitting: Emergency Medicine

## 2017-12-11 ENCOUNTER — Other Ambulatory Visit: Payer: Self-pay

## 2017-12-11 DIAGNOSIS — K449 Diaphragmatic hernia without obstruction or gangrene: Secondary | ICD-10-CM | POA: Insufficient documentation

## 2017-12-11 DIAGNOSIS — Z79899 Other long term (current) drug therapy: Secondary | ICD-10-CM | POA: Insufficient documentation

## 2017-12-11 DIAGNOSIS — K228 Other specified diseases of esophagus: Secondary | ICD-10-CM | POA: Diagnosis not present

## 2017-12-11 DIAGNOSIS — K222 Esophageal obstruction: Secondary | ICD-10-CM | POA: Insufficient documentation

## 2017-12-11 DIAGNOSIS — I350 Nonrheumatic aortic (valve) stenosis: Secondary | ICD-10-CM | POA: Diagnosis not present

## 2017-12-11 DIAGNOSIS — W44F3XA Food entering into or through a natural orifice, initial encounter: Secondary | ICD-10-CM | POA: Diagnosis present

## 2017-12-11 DIAGNOSIS — I2510 Atherosclerotic heart disease of native coronary artery without angina pectoris: Secondary | ICD-10-CM | POA: Diagnosis not present

## 2017-12-11 DIAGNOSIS — Z7982 Long term (current) use of aspirin: Secondary | ICD-10-CM | POA: Diagnosis not present

## 2017-12-11 DIAGNOSIS — X58XXXA Exposure to other specified factors, initial encounter: Secondary | ICD-10-CM | POA: Insufficient documentation

## 2017-12-11 DIAGNOSIS — K219 Gastro-esophageal reflux disease without esophagitis: Secondary | ICD-10-CM | POA: Insufficient documentation

## 2017-12-11 DIAGNOSIS — Z7902 Long term (current) use of antithrombotics/antiplatelets: Secondary | ICD-10-CM | POA: Diagnosis not present

## 2017-12-11 DIAGNOSIS — F17210 Nicotine dependence, cigarettes, uncomplicated: Secondary | ICD-10-CM | POA: Diagnosis not present

## 2017-12-11 DIAGNOSIS — T18128A Food in esophagus causing other injury, initial encounter: Secondary | ICD-10-CM | POA: Insufficient documentation

## 2017-12-11 HISTORY — DX: Atherosclerotic heart disease of native coronary artery without angina pectoris: I25.10

## 2017-12-11 HISTORY — PX: FOREIGN BODY REMOVAL: SHX962

## 2017-12-11 HISTORY — DX: Nonrheumatic aortic (valve) stenosis: I35.0

## 2017-12-11 HISTORY — DX: Food entering into or through a natural orifice, initial encounter: W44.F3XA

## 2017-12-11 HISTORY — PX: ESOPHAGOGASTRODUODENOSCOPY (EGD) WITH PROPOFOL: SHX5813

## 2017-12-11 HISTORY — DX: Thoracic aortic aneurysm, without rupture, unspecified: I71.20

## 2017-12-11 HISTORY — DX: Gastro-esophageal reflux disease without esophagitis: K21.9

## 2017-12-11 HISTORY — DX: Diaphragmatic hernia without obstruction or gangrene: K44.9

## 2017-12-11 HISTORY — DX: Food in esophagus causing other injury, initial encounter: T18.128A

## 2017-12-11 HISTORY — DX: Thoracic aortic aneurysm, without rupture: I71.2

## 2017-12-11 SURGERY — ESOPHAGOGASTRODUODENOSCOPY (EGD) WITH PROPOFOL
Anesthesia: General

## 2017-12-11 MED ORDER — PHENYLEPHRINE 40 MCG/ML (10ML) SYRINGE FOR IV PUSH (FOR BLOOD PRESSURE SUPPORT)
PREFILLED_SYRINGE | INTRAVENOUS | Status: DC | PRN
  Administered 2017-12-11 (×2): 40 ug via INTRAVENOUS

## 2017-12-11 MED ORDER — FENTANYL CITRATE (PF) 250 MCG/5ML IJ SOLN
INTRAMUSCULAR | Status: DC | PRN
  Administered 2017-12-11 (×2): 50 ug via INTRAVENOUS

## 2017-12-11 MED ORDER — PHENYLEPHRINE HCL 10 MG/ML IJ SOLN
INTRAMUSCULAR | Status: DC | PRN
  Administered 2017-12-11: 50 ug/min via INTRAVENOUS

## 2017-12-11 MED ORDER — FAMOTIDINE IN NACL 20-0.9 MG/50ML-% IV SOLN
20.0000 mg | INTRAVENOUS | Status: AC
  Administered 2017-12-11: 20 mg via INTRAVENOUS
  Filled 2017-12-11: qty 50

## 2017-12-11 MED ORDER — PROPOFOL 10 MG/ML IV BOLUS
INTRAVENOUS | Status: DC | PRN
  Administered 2017-12-11: 20 mg via INTRAVENOUS
  Administered 2017-12-11: 100 mg via INTRAVENOUS
  Administered 2017-12-11: 20 mg via INTRAVENOUS
  Administered 2017-12-11: 50 mg via INTRAVENOUS

## 2017-12-11 MED ORDER — METOCLOPRAMIDE HCL 5 MG/ML IJ SOLN
10.0000 mg | INTRAMUSCULAR | Status: AC
  Administered 2017-12-11: 10 mg via INTRAVENOUS
  Filled 2017-12-11: qty 2

## 2017-12-11 MED ORDER — SUCCINYLCHOLINE CHLORIDE 200 MG/10ML IV SOSY
PREFILLED_SYRINGE | INTRAVENOUS | Status: DC | PRN
  Administered 2017-12-11: 120 mg via INTRAVENOUS

## 2017-12-11 MED ORDER — LIDOCAINE 2% (20 MG/ML) 5 ML SYRINGE
INTRAMUSCULAR | Status: DC | PRN
  Administered 2017-12-11: 100 mg via INTRAVENOUS

## 2017-12-11 MED ORDER — ONDANSETRON HCL 4 MG/2ML IJ SOLN
INTRAMUSCULAR | Status: DC | PRN
Start: 2017-12-11 — End: 2017-12-11
  Administered 2017-12-11: 4 mg via INTRAVENOUS

## 2017-12-11 MED ORDER — LACTATED RINGERS IV SOLN
INTRAVENOUS | Status: AC | PRN
  Administered 2017-12-11: 1000 mL via INTRAVENOUS

## 2017-12-11 MED ORDER — ONDANSETRON HCL 4 MG/2ML IJ SOLN
4.0000 mg | Freq: Once | INTRAMUSCULAR | Status: AC
  Administered 2017-12-11: 4 mg via INTRAVENOUS
  Filled 2017-12-11: qty 2

## 2017-12-11 SURGICAL SUPPLY — 14 items

## 2017-12-11 NOTE — Discharge Instructions (Addendum)
° °  I removed the food - it looks like the esophagus is narrowed some (stricture). Could not dilate because of the Plavix blood thinner. I believe lack of upper dentures contributing also.  I will contact you with biopsy results - taken to look for a condition called eosinophilic esophagitis - and plans.  For now please follow a Dysphagia 3 diet (attached)  I appreciate the opportunity to care for you. Iva Boop, MD, FACG  YOU HAD AN ENDOSCOPIC PROCEDURE TODAY: Refer to the procedure report and other information in the discharge instructions given to you for any specific questions about what was found during the examination. If this information does not answer your questions, please call Dr. Marvell Fuller office at [phone] to clarify.   YOU SHOULD EXPECT: Some feelings of bloating in the abdomen. Passage of more gas than usual. Walking can help get rid of the air that was put into your GI tract during the procedure and reduce the bloating. If you had a lower endoscopy (such as a colonoscopy or flexible sigmoidoscopy) you may notice spotting of blood in your stool or on the toilet paper. Some abdominal soreness may be present for a day or two, also.  DIET:  Try liquids then use Dysphagia 3 diet   ACTIVITY: Your care partner should take you home directly after the procedure. You should plan to take it easy, moving slowly for the rest of the day. You can resume normal activity the day after the procedure however YOU SHOULD NOT DRIVE, use power tools, machinery or perform tasks that involve climbing or major physical exertion for 24 hours (because of the sedation medicines used during the test).   SYMPTOMS TO REPORT IMMEDIATELY: A gastroenterologist can be reached at any hour. Please call [phone]  for any of the following symptoms:   Following upper endoscopy (EGD, EUS, ERCP, esophageal dilation) Vomiting of blood or coffee ground material  New, significant abdominal pain  New,  significant chest pain or pain under the shoulder blades  Painful or persistently difficult swallowing  New shortness of breath  Black, tarry-looking or red, bloody stools  FOLLOW UP:  If any biopsies were taken you will be contacted by phone or by letter within the next 1-3 weeks. Call [phone]  if you have not heard about the biopsies in 3 weeks.  Please also call with any specific questions about appointments or follow up tests.

## 2017-12-11 NOTE — ED Provider Notes (Signed)
[facility] EMERGENCY DEPARTMENT Provider Note   CSN: [id number] Arrival date & time: 12/11/17  4098     History   Chief Complaint Chief Complaint  Patient presents with  . food impaction    HPI Tyler Kemp is a 57 y.o. male.  57 year old male with a history of coronary artery disease, hiatal hernia, AAA, reflux presents to the emergency department in transfer from [facility].  Patient presenting for management of suspected food impaction which began 3 days ago after eating barbecue on Saturday.  He has been unable to tolerate solid foods without vomiting since this time.  He has been able to tolerate liquids intermittently.  No issues tolerating secretions.  When he eats solids, he states that it feels as though it is stuck near his xiphoid process.  Last episode of vomiting was at home, for presenting to the [facility] ED.  He has had associated increased belching.  No fevers.  He was given fluids in the emergency department prior to transfer.  No other medications taken prior to arrival.  Accepting GI - Dr. Milbert Coulter     Past Medical History:  Diagnosis Date  . AAA (abdominal aortic aneurysm) (HCC)   . Coronary artery disease   . GERD (gastroesophageal reflux disease)   . Hiatal hernia   . Severe aortic stenosis     Patient Active Problem List   Diagnosis Date Noted  . Ex-smoker 11/26/2017  . Coronary artery disease 10/03/2017  . GERD (gastroesophageal reflux disease) 10/03/2017  . Aortic aneurysm without rupture (HCC) 09/06/2016  . Severe aortic stenosis 06/15/2016    Past Surgical History:  Procedure Laterality Date  . CHOLECYSTECTOMY    . RIGHT/LEFT HEART CATH AND CORONARY ANGIOGRAPHY N/A 12/04/2017   Procedure: RIGHT/LEFT HEART CATH AND CORONARY ANGIOGRAPHY;  Surgeon: Lyn Records, MD;  Location: [facility] INVASIVE CV LAB;  Service: Cardiovascular;  Laterality: N/A;        Home Medications    Prior to Admission medications     Medication Sig Start Date End Date Taking? Authorizing Provider  aspirin 81 MG chewable tablet  06/22/17   [provider]  atorvastatin (LIPITOR) 10 MG tablet Take 1 tablet (10 mg total) by mouth daily at 6 PM. 11/23/17 02/21/18  Revankar, Aundra Dubin, MD  clopidogrel (PLAVIX) 75 MG tablet Take 1 tablet (75 mg total) by mouth daily. 09/26/17   Revankar, Aundra Dubin, MD  metoprolol succinate (TOPROL-XL) 25 MG 24 hr tablet  07/20/17   [provider]  metoprolol tartrate (LOPRESSOR) 25 MG tablet Take 1 tablet (25 mg total) by mouth 2 (two) times daily. 09/27/17 12/26/17  Revankar, Aundra Dubin, MD  omeprazole (PRILOSEC) 40 MG capsule Take 1 capsule (40 mg total) by mouth daily. 10/03/17   Revankar, Aundra Dubin, MD    Family History No family history on file.  Social History Social History   Tobacco Use  . Smoking status: Current Some Day Smoker  . Smokeless tobacco: Current User  Substance Use Topics  . Alcohol use: Yes    Comment: occasional  . Drug use: Never     Allergies   Patient has no known allergies.   Review of Systems Review of Systems Ten systems reviewed and are negative for acute change, except as noted in the HPI.    Physical Exam Updated Vital Signs BP 122/88 (BP Location: Right Arm)   Pulse 73   Temp 97.8 F (36.6 C) (Oral)   Resp 18   Ht   (1.753 m)   Wt 104.3 kg (230 lb)   SpO2 100%   BMI 33.97 kg/m   Physical Exam  Constitutional: He is oriented to person, place, and time. He appears well-developed and well-nourished. No distress.  Nontoxic appearing and in NAD  HENT:  Head: Normocephalic and atraumatic.  Tolerating secretions  Eyes: Conjunctivae and EOM are normal. No scleral icterus.  Neck: Normal range of motion.  Cardiovascular: Normal rate, regular rhythm and intact distal pulses.  Pulmonary/Chest: Effort normal. No respiratory distress.  Respirations even and unlabored  Musculoskeletal: Normal range of motion.  Neurological: He is  alert and oriented to person, place, and time. He exhibits normal muscle tone. Coordination normal.  GCS 15. Speech is goal oriented.  Skin: Skin is warm and dry. No rash noted. He is not diaphoretic. No erythema. No pallor.  Psychiatric: He has a normal mood and affect. His behavior is normal.  Nursing note and vitals reviewed.    ED Treatments / Results  Labs (all labs ordered are listed, but only abnormal results are displayed) Labs Reviewed - No data to display  EKG None  Radiology No results found.   CTA Chest W/Contrast - 12/11/2017 Result Impression  1. Negative for acute pulmonary embolus. 2. Interim finding of a 2.6 cm low-density lesion with surrounding peripheral increased density within the distal esophagus, suspicious for foreign body or possible impacted food bolus. The surrounding esophagus is slightly thickened consistent with esophagitis. There is a moderate hiatal hernia. 3. 6 mm right lower lobe pulmonary nodule. Non-contrast chest CT at 6-12 months is recommended. If the nodule is stable at time of repeat CT, then future CT at 18-24 months (from today's scan) is considered optional for low-risk patients, but is recommended for high-risk patients. This recommendation follows the consensus statement: Guidelines for Management of Incidental Pulmonary Nodules Detected on CT Images: From the Fleischner Society 2017; Radiology 2017; 284:228-243. 4. Stable 4.8 cm ascending aortic aneurysm. Ascending thoracic aortic aneurysm. Recommend semi-annual imaging followup by CTA or MRA and referral to cardiothoracic surgery if not already obtained. This recommendation follows 2010 ACCF/AHA/AATS/ACR/ASA/SCA/SCAI/SIR/STS/SVM Guidelines for the Diagnosis and Management of Patients With Thoracic Aortic Disease. Circulation. 2010; 121: Z610-R604     Procedures Procedures (including critical care time)  Medications Ordered in ED Medications  metoCLOPramide (REGLAN)  injection 10 mg (has no administration in time range)  famotidine (PEPCID) IVPB 20 mg premix (has no administration in time range)     Initial Impression / Assessment and Plan / ED Course  I have reviewed the triage vital signs and the nursing notes.  Pertinent labs & imaging results that were available during my care of the patient were reviewed by me and considered in my medical decision making (see chart for details).     4:08 AM Patient arriving in transfer from outside hospital.  Concern for distal food impaction in the esophagus.  Patient tolerating secretions at this time.  Vitals reassuring.  Patient made n.p.o. in anticipation of likely endoscopy under general anesthesia.  Will contact GI to notify of patient arrival.  4:13 AM Dr. Myrtie Neither aware of patient arrival.  Plan to mobilize general anesthesia team this AM, however anticipates this to take several hours.  Patient will be seen by morning GI team for management.  5:24 AM Patient experiencing nausea and dry heaves; mostly phlegm. Zofran ordered.  6:16 AM Patient signed out to Frederik Pear, PA-C at change of shift pending GI intervention.   Vitals:  12/11/17 0430 12/11/17 0530 12/11/17 0545 12/11/17 0600  BP: 122/75 (!) 141/85 (!) 147/95 132/89  Pulse: 63 62 67 67  Resp:  Temp:      TempSrc:      SpO2: 99% 98% 99% 98%  Weight:      Height:        Final Clinical Impressions(s) / ED Diagnoses   Final diagnoses:  Food impaction of esophagus, initial encounter    ED Discharge Orders    None       Antony Madura, PA-C 12/11/17 4098    Ward, Layla Maw, DO 12/11/17 [phone]

## 2017-12-11 NOTE — Anesthesia Procedure Notes (Signed)
Procedure Name: Intubation Date/Time: 12/11/2017 9:31 AM Performed by: Wilburn Cornelia, CRNA Pre-anesthesia Checklist: Patient identified, Emergency Drugs available, Suction available, Patient being monitored and Timeout performed Patient Re-evaluated:Patient Re-evaluated prior to induction Oxygen Delivery Method: Circle system utilized Preoxygenation: Pre-oxygenation with 100% oxygen Induction Type: IV induction, Rapid sequence and Cricoid Pressure applied Laryngoscope Size: Mac and 4 Grade View: Grade I Tube type: Oral Tube size: 7.5 mm Number of attempts: 1 Airway Equipment and Method: Stylet Placement Confirmation: ETT inserted through vocal cords under direct vision,  positive ETCO2,  breath sounds checked- equal and bilateral and CO2 detector Secured at: 23 cm Tube secured with: Tape Dental Injury: Teeth and Oropharynx as per pre-operative assessment

## 2017-12-11 NOTE — Anesthesia Preprocedure Evaluation (Addendum)
Anesthesia Evaluation  Patient identified by MRN, date of birth, ID band Patient awake    Reviewed: Allergy & Precautions, NPO status , Patient's Chart, lab work & pertinent test results  Airway Mallampati: II  TM Distance: >3 FB Neck ROM: Full    Dental no notable dental hx. (+) Edentulous Upper, Poor Dentition   Pulmonary neg pulmonary ROS, Current Smoker,    Pulmonary exam normal breath sounds clear to auscultation       Cardiovascular + CAD  negative cardio ROS Normal cardiovascular exam Rhythm:Regular Rate:Normal + Systolic murmurs    Neuro/Psych negative neurological ROS  negative psych ROS   GI/Hepatic negative GI ROS, Neg liver ROS, GERD  ,  Endo/Other  negative endocrine ROS  Renal/GU negative Renal ROS  negative genitourinary   Musculoskeletal negative musculoskeletal ROS (+)   Abdominal   Peds negative pediatric ROS (+)  Hematology negative hematology ROS (+)   Anesthesia Other Findings   Reproductive/Obstetrics negative OB ROS                            Anesthesia Physical Anesthesia Plan  ASA: III  Anesthesia Plan: General   Post-op Pain Management:    Induction: Intravenous, Rapid sequence and Cricoid pressure planned  PONV Risk Score and Plan: 1 and Ondansetron and Treatment may vary due to age or medical condition  Airway Management Planned: Oral ETT  Additional Equipment:   Intra-op Plan:   Post-operative Plan: Extubation in OR  Informed Consent: I have reviewed the patients History and Physical, chart, labs and discussed the procedure including the risks, benefits and alternatives for the proposed anesthesia with the patient or authorized representative who has indicated his/her understanding and acceptance.   Dental advisory given  Plan Discussed with: CRNA  Anesthesia Plan Comments:         Anesthesia Quick Evaluation

## 2017-12-11 NOTE — ED Notes (Signed)
Discussed plan of care with patient. Advised we are waiting for GI MD

## 2017-12-11 NOTE — H&P (Addendum)
[facility] History and Physical   Primary Care Physician:  [facility]   Reason for Procedure:   Remove food impaction  Plan:    EGD and food impaction removal  The risks and benefits as well as alternatives of endoscopic procedure(s) have been discussed and reviewed. All questions answered. The patient agrees to proceed.  HPI: Tyler Kemp is a 57 y.o. male with multiple medical problems who has had a food impaction of BBQ x 3 days or more. Went to hospital in [place] and due to sxs of some chest pain CT angio done - no changes c/w earlier this month except suspected esophageal food impaction.  He has vomited up some food here but could not swallow water when tested again.  Otherwise no dyspnea, chest pain at this time.  He is here with his son  Is on Plavix  Past Medical History:  Diagnosis Date  . Coronary artery disease   . Food impaction of esophagus 12/11/2017  . GERD (gastroesophageal reflux disease)   . Hiatal hernia   . Severe aortic stenosis   . Thoracic aortic aneurysm [facility])     Past Surgical History:  Procedure Laterality Date  . CHOLECYSTECTOMY    . RIGHT/LEFT HEART CATH AND CORONARY ANGIOGRAPHY N/A 12/04/2017   Procedure: RIGHT/LEFT HEART CATH AND CORONARY ANGIOGRAPHY;  Surgeon: Lyn Records, MD;  Location: [facility] INV[facility]IVE CV LAB;  Service: Cardiovascular;  Laterality: N/A;    Prior to Admission medications   Medication Sig Start Date End Date Taking? Authorizing Provider  aspirin 81 MG chewable tablet Chew 81 mg by mouth daily.  06/22/17  Yes [provider]  atorvastatin (LIPITOR) 10 MG tablet Take 1 tablet (10 mg total) by mouth daily at 6 PM. 11/23/17 02/21/18 Yes Revankar, Aundra Dubin, MD  clopidogrel (PLAVIX) 75 MG tablet Take 1 tablet (75 mg total) by mouth daily. 09/26/17  Yes Revankar, Aundra Dubin, MD  metoprolol tartrate (LOPRESSOR) 25 MG tablet Take 1 tablet (25 mg total) by mouth 2 (two) times daily. 09/27/17 12/26/17  Yes Revankar, Aundra Dubin, MD  omeprazole (PRILOSEC) 40 MG capsule Take 1 capsule (40 mg total) by mouth daily. 10/03/17  Yes Revankar, Aundra Dubin, MD    Current Facility-Administered Medications  Medication Dose Route Frequency Provider Last Rate Last Dose  . lactated ringers infusion    Continuous PRN Iva Boop, MD   1,000 mL at 12/11/17 0981    Allergies as of 12/11/2017  . (No Known Allergies)    History reviewed. No pertinent family history.  Social History   Socioeconomic History  . Marital status: Single    Spouse name: Not on file  . Number of children: Not on file  . Years of education: Not on file  . Highest education level: Not on file  Occupational History  . Not on file  Social Needs  . Financial resource strain: Not on file  . Food insecurity:    Worry: Not on file    Inability: Not on file  . Transportation needs:    Medical: Not on file    Non-medical: Not on file  Tobacco Use  . Smoking status: Current Some Day Smoker  . Smokeless tobacco: Current User  Substance and Sexual Activity  . Alcohol use: Yes    Comment: occasional  . Drug use: Never  . Sexual activity: Not on file  Lifestyle  . Physical activity:    Days per week: Not on file    Minutes  per session: Not on file  . Stress: Not on file  Relationships  . Social connections:    Talks on phone: Not on file    Gets together: Not on file    Attends religious service: Not on file    Active member of club or organization: Not on file    Attends meetings of clubs or organizations: Not on file    Relationship status: Not on file  . Intimate partner violence:    Fear of current or ex partner: Not on file    Emotionally abused: Not on file    Physically abused: Not on file    Forced sexual activity: Not on file  Other Topics Concern  . Not on file  Social History Narrative  . Not on file    Review of Systems: Positive for what is in HPI All other review of systems negative except as  mentioned in the HPI.  Physical Exam: Vital signs in last 24 hours: Temp:  [97.8 F (36.6 C)] 97.8 F (36.6 C) (04/30 0902) Pulse Rate:  [57-73] 64 (04/30 0830) Resp:  [12-20] 12 (04/30 0902) BP: (114-147)/(70-95) 135/85 (04/30 0902) SpO2:  [98 %-100 %] 100 % (04/30 0902) Weight:  [230 lb (104.3 kg)] 230 lb (104.3 kg) (04/30 0902)   General:   Alert,  Well-developed, well-nourished, pleasant and cooperative in NAD Lungs:  Clear throughout to auscultation.   Heart:  Regular rate and rhythm; + 3/6 SEM murmur Abdomen:  Soft, nontender and nondistended. Normal bowel sounds.   Neuro/Psych:  Alert and cooperative. Normal mood and affect. A and O x 3    Sena Slate, MD, [facility] [phone] (pager) 12/11/2017 9:05 AM@

## 2017-12-11 NOTE — ED Triage Notes (Addendum)
Patient sent from [facility] for further evaluation of food impaction. Pt states since eating jBBQ Saturday he has been unable to get food to pass into stomach. Pt states liquids feel as if they seem slowly through. Pt has known hiatal hernia.  Excessive belching noted.

## 2017-12-11 NOTE — Transfer of Care (Signed)
Immediate Anesthesia Transfer of Care Note  Patient: Tyler Kemp  Procedure(s) Performed: ESOPHAGOGASTRODUODENOSCOPY (EGD) WITH PROPOFOL (N/A ) FOREIGN BODY REMOVAL (N/A )  Patient Location: Endoscopy Unit  Anesthesia Type:General  Level of Consciousness: awake, alert  and oriented  Airway & Oxygen Therapy: Patient Spontanous Breathing and Patient connected to nasal cannula oxygen  Post-op Assessment: Report given to RN and Post -op Vital signs reviewed and stable  Post vital signs: Reviewed and stable  Last Vitals:  Vitals Value Taken Time  BP 123/76 12/11/2017 10:04 AM  Temp    Pulse 66 12/11/2017 10:05 AM  Resp 16 12/11/2017 10:05 AM  SpO2 100 % 12/11/2017 10:05 AM  Vitals shown include unvalidated device data.  Last Pain:  Vitals:   12/11/17 1004  TempSrc:   PainSc: 0-No pain         Complications: No apparent anesthesia complications

## 2017-12-11 NOTE — Anesthesia Postprocedure Evaluation (Signed)
Anesthesia Post Note  Patient: Tyler Kemp  Procedure(s) Performed: ESOPHAGOGASTRODUODENOSCOPY (EGD) WITH PROPOFOL (N/A ) FOREIGN BODY REMOVAL (N/A )     Patient location during evaluation: PACU Anesthesia Type: General Level of consciousness: awake and alert Pain management: pain level controlled Vital Signs Assessment: post-procedure vital signs reviewed and stable Respiratory status: spontaneous breathing, nonlabored ventilation and respiratory function stable Cardiovascular status: blood pressure returned to baseline and stable Postop Assessment: no apparent nausea or vomiting Anesthetic complications: no    Last Vitals:  Vitals:   12/11/17 1020 12/11/17 1027  BP:  (!) 150/74  Pulse: 65 64  Resp: 17 15  Temp:    SpO2: 100% 99%    Last Pain:  Vitals:   12/11/17 1027  TempSrc:   PainSc: 0-No pain                 Lowella Curb

## 2017-12-11 NOTE — ED Notes (Signed)
Pt ambulatory to [facility]. Reports he threw up and little bit ago, and thinks he might have passed some of the impacted BBQ. He does feel some esophageal relief. Is a x 4. Family member at bedside.

## 2017-12-11 NOTE — ED Notes (Signed)
Pt going to endo, report given, will not be returned after procedure.

## 2017-12-11 NOTE — ED Provider Notes (Signed)
57 year old male received at sign out from [facility] pending evaluation and likely EGD with GI. Per her history:  "57 year old male with a history of coronary artery disease, hiatal hernia, AAA, reflux presents to the emergency department in transfer from [facility].  Patient presenting for management of suspected food impaction which began 3 days ago after eating barbecue on Saturday.  He has been unable to tolerate solid foods without vomiting since this time.  He has been able to tolerate liquids intermittently.  No issues tolerating secretions.  When he eats solids, he states that it feels as though it is stuck near his xiphoid process.  Last episode of vomiting was at home, for presenting to the [facility] ED.  He has had associated increased belching.  No fevers.  He was given fluids in the emergency department prior to transfer.  No other medications taken prior to arrival.  Accepting GI - Dr. Myrtie Neither, [place]"  Physical Exam  BP (!) 150/74   Pulse 64   Temp 97.7 F (36.5 C) (Oral)   Resp 15   Ht  (1.753 m)   Wt 104.3 kg (230 lb)   SpO2 99%   BMI 33.97 kg/m   Physical Exam  NAD.   ED Course/Procedures     Procedures  MDM   57 year old male received signout from [facility] pending GI evaluation.  Patient was seen by PA [place] and will have EGD with Dr. Leone Payor today.  Please see their notes for further working and interventions.        Frederik Pear A, PA-C 12/11/17 1033    Ward, Layla Maw, DO 12/11/17 2309

## 2017-12-11 NOTE — Op Note (Addendum)
[facility] Patient Name: Tyler Kemp Procedure Date : 12/11/2017 MRN: [id number] Attending MD: Iva Boop , MD Date of Birth: 11/30/1960 CSN: [id number] Age: 57 Admit Type: Outpatient Procedure:                Upper GI endoscopy Indications:              Foreign body in the esophagus Providers:                Iva Boop, MD, Norman Clay, RN, Arlee Muslim                            Tech., Technician, Glo Herring, CRNA Referring MD:              Medicines:                General Anesthesia Complications:            No immediate complications. Estimated Blood Loss:     Estimated blood loss was minimal. Procedure:                Pre-Anesthesia Assessment:                           - Prior to the procedure, a History and Physical                            was performed, and patient medications and                            allergies were reviewed. The patient's tolerance of                            previous anesthesia was also reviewed. The risks                            and benefits of the procedure and the sedation                            options and risks were discussed with the patient.                            All questions were answered, and informed consent                            was obtained. Prior Anticoagulants: The patient                            last took Plavix (clopidogrel) 1 day prior to the                            procedure. ASA Grade Assessment: III - A patient                            with severe systemic disease. After reviewing the  risks and benefits, the patient was deemed in                            satisfactory condition to undergo the procedure.                           After obtaining informed consent, the endoscope was                            passed under direct vision. Throughout the                            procedure, the patient's blood pressure, pulse, and         oxygen saturations were monitored continuously. The                            EG-[id number] ([id number]) scope was introduced through the                            mouth, and advanced to the second part of duodenum.                            The upper GI endoscopy was accomplished without                            difficulty. The patient tolerated the procedure                            well. Scope In: Scope Out: Findings:      Food was found in the lower third of the esophagus. Removal of food was       accomplished. Some advanmced and most suctined with banding cap       Estimated blood loss: none.      One benign-appearing, intrinsic mild stenosis was found in the distal       esophagus. The stenosis was traversed.      Biopsies were taken with a cold forceps in the entire esophagus for       histology. Verification of patient identification for the specimen was       done. Estimated blood loss was minimal.      The Z-line was irregular and was found at the gastroesophageal junction.      A 1-2 cm sliding hiatal hernia was present.      The exam was otherwise without abnormality.      The cardia and gastric fundus were normal on retroflexion. Impression:               - Food in the lower third of the esophagus. Removal                            was successful.                           - Benign-appearing esophageal stenosis.                           -  Z-line irregular, at the gastroesophageal                            junction.                           - 1-2 cm sliding hiatal hernia.                           - The examination was otherwise normal.                           - Biopsies were taken with a cold forceps for                            histology in the entire esophagus. Recommendation:           - Patient has a contact number available for                            emergencies. The signs and symptoms of potential                            delayed complications  were discussed with the                            patient. Return to normal activities tomorrow.                            Written discharge instructions were provided to the                            patient.                           - Dysphagia 3.                           - Continue present medications.                           - Resume Plavix (clopidogrel) at prior dose today.                           - Await pathology results.                           - Once path reviewed will decide next step(s)                           Seems to have a benign eccentric distal esophageal                            stricture                           Son reports he had dysphagia last year but much  better after cholecystectomy (? if that is when reg                            PPI started)                           On omeprazole - may be better to be on pantoprazole                            with clopidogrel(will query cardiology)                           May need a dilation but upper dentures would help -                            he needs aortic valve and thoracic aortic aneurysm                            surgery - will need TEE for that so will at least                            do a ba swallow once path reviewed and may need                            pre-surgery dilation also Procedure Code(s):        --- Professional ---                           [phone], Esophagogastroduodenoscopy, flexible,                            transoral; with removal of foreign body(s)                           43239, Esophagogastroduodenoscopy, flexible,                            transoral; with biopsy, single or multiple Diagnosis Code(s):        --- Professional ---                           X91.478G, Food in esophagus causing other injury,                            initial encounter                           K22.2, Esophageal obstruction                           K22.8, Other  specified diseases of esophagus                           K44.9, Diaphragmatic hernia without obstruction or  gangrene                           T18.108A, Unspecified foreign body in esophagus                            causing other injury, initial encounter CPT copyright 2017 [employer]. All rights reserved. The codes documented in this report are preliminary and upon coder review may  be revised to meet current compliance requirements. Iva Boop, MD 12/11/2017 10:16:41 AM This report has been signed electronically. Number of Addenda: 0

## 2017-12-12 ENCOUNTER — Encounter (HOSPITAL_COMMUNITY): Payer: Self-pay | Admitting: Internal Medicine

## 2017-12-13 ENCOUNTER — Other Ambulatory Visit: Payer: Self-pay

## 2017-12-13 DIAGNOSIS — R1310 Dysphagia, unspecified: Secondary | ICD-10-CM

## 2017-12-13 NOTE — Progress Notes (Signed)
Esophageal biopsies ok - no abnormalities  He needs:  1) Barium swallow with tablet re: dysphagia 2) stop omeprazole 40 mg and start pantoprazole 40 mg qd before breakfast # 90 3 RF - let him know his cardiologist prefers that he take this instead of omeprazole due to possible interaction with clopidogrel (Plavix)

## 2017-12-14 ENCOUNTER — Other Ambulatory Visit: Payer: Self-pay

## 2017-12-14 MED ORDER — PANTOPRAZOLE SODIUM 40 MG PO TBEC: 40.0000 mg | DELAYED_RELEASE_TABLET | Freq: Every day | ORAL | 3 refills | Status: DC

## 2017-12-18 ENCOUNTER — Ambulatory Visit (HOSPITAL_COMMUNITY): Admission: RE | Admit: 2017-12-18 | Payer: BLUE CROSS/BLUE SHIELD | Source: Ambulatory Visit

## 2017-12-19 ENCOUNTER — Encounter: Payer: BLUE CROSS/BLUE SHIELD | Admitting: Surgery

## 2017-12-31 ENCOUNTER — Ambulatory Visit: Payer: BLUE CROSS/BLUE SHIELD | Admitting: Cardiology

## 2018-01-03 ENCOUNTER — Other Ambulatory Visit: Payer: Self-pay

## 2018-01-03 ENCOUNTER — Encounter: Payer: Self-pay | Admitting: Surgery

## 2018-01-03 ENCOUNTER — Ambulatory Visit (INDEPENDENT_AMBULATORY_CARE_PROVIDER_SITE_OTHER): Payer: BLUE CROSS/BLUE SHIELD | Admitting: Surgery

## 2018-01-03 ENCOUNTER — Ambulatory Visit: Payer: BLUE CROSS/BLUE SHIELD | Admitting: Cardiology

## 2018-01-03 VITALS — BP 126/76 | HR 87 | Resp 20 | Ht 70.0 in | Wt 230.0 lb

## 2018-01-03 DIAGNOSIS — I350 Nonrheumatic aortic (valve) stenosis: Secondary | ICD-10-CM

## 2018-01-03 DIAGNOSIS — I2510 Atherosclerotic heart disease of native coronary artery without angina pectoris: Secondary | ICD-10-CM

## 2018-01-03 DIAGNOSIS — I712 Thoracic aortic aneurysm, without rupture, unspecified: Secondary | ICD-10-CM

## 2018-01-04 ENCOUNTER — Encounter: Payer: Self-pay | Admitting: Surgery

## 2018-01-04 NOTE — Progress Notes (Signed)
Patient ID: Tyler Kemp, male   DOB: 12-05-1960, 57 y.o.   MRN: [id number]  [facility]  MULTIDISCIPLINARY HEART VALVE CLINIC  CARDIOTHORACIC SURGERY CONSULTATION REPORT  Referring Provider is Lyn Records, MD PCP is Practice, [facility]  Chief Complaint  Patient presents with  . Aortic Stenosis    Surgical eval, Cardiac Cath 12/04/17, CTA Chest 11/23/17, ECHO 11/22/17  . Coronary Artery Disease  . Thoracic Aortic Aneurysm    HPI:  The patient is a 57 year old gentleman with a history of coronary artery disease status post PCI and stenting in the past at [facility], aortic stenosis, and ascending aortic aneurysm who has been followed in the past by Dr. Tomie China.  He had a recent follow-up echocardiogram on 11/22/2017 which showed progression of aortic stenosis to severe with a mean gradient of 51 mmHg and a peak gradient of 80 mmHg.  The dimensionless index was 0.19.  Left ventricular ejection fraction was 55 to 60%.  His aortic root was noted to be dilated at 46 mm.  Cardiac catheterization on 12/04/2017 showed severe diffuse coronary disease with 50% ostial and 40% distal left main stenoses.  The LAD had an eccentric 75% stenosis distal to the previously placed proximal to mid LAD stent.  There is high-grade obstruction in the second marginal branch.  There is a high-grade stenosis in the second posterior lateral branch of the right coronary.  Right heart pressures were normal.  He underwent a CT angiogram of the chest which showed the aortic root and ascending aorta to be 4.7 cm.  The patient is here with his daughter-in-law today.  He is a somewhat difficult historian but reports some shortness of breath and chest tightness with exertion.  He has started to have some dizzy spells.  He continues to work doing Youth worker.  He is a widower and lives alone in [place].  He has 2 children both of whom work full-time.  He reports quitting smoking about 4 months  ago.  He reports drinking alcohol every weekend which may include a pint or more of liquor.  He had an episode of esophageal food impaction on 12/11/2017 which occurred after eating barbecue.  He waited several days before seeking medical attention.  He underwent upper endoscopy by Dr. Leone Payor and was found to have some food in the distal esophagus which was removed.  There was a mild stricture in the distal esophagus and a hiatal hernia.  The patient reports that when he was evaluated in the past at [facility] he was told that he would likely require aortic valve replacement in the future and he was noted to have bad teeth at that time and told that he needed to have multiple teeth extracted.  He had this done and only has a few remaining teeth in the lower jaw.  Past Medical History:  Diagnosis Date  . Coronary artery disease   . Food impaction of esophagus 12/11/2017  . GERD (gastroesophageal reflux disease)   . Hiatal hernia   . Severe aortic stenosis   . Thoracic aortic aneurysm [facility])     Past Surgical History:  Procedure Laterality Date  . CHOLECYSTECTOMY    . ESOPHAGOGASTRODUODENOSCOPY (EGD) WITH PROPOFOL N/A 12/11/2017   Procedure: ESOPHAGOGASTRODUODENOSCOPY (EGD) WITH PROPOFOL;  Surgeon: Iva Boop, MD;  Location: [facility] ENDOSCOPY;  Service: Endoscopy;  Laterality: N/A;  . FOREIGN BODY REMOVAL N/A 12/11/2017   Procedure: FOREIGN BODY REMOVAL;  Surgeon: Iva Boop,  MD;  Location: [facility] ENDOSCOPY;  Service: Endoscopy;  Laterality: N/A;  . RIGHT/LEFT HEART CATH AND CORONARY ANGIOGRAPHY N/A 12/04/2017   Procedure: RIGHT/LEFT HEART CATH AND CORONARY ANGIOGRAPHY;  Surgeon: Lyn Records, MD;  Location: [facility] INVASIVE CV LAB;  Service: Cardiovascular;  Laterality: N/A;    History reviewed. No pertinent family history.  Social History   Socioeconomic History  . Marital status: Single    Spouse name: Not on file  . Number of children: Not on file  . Years of education: Not on  file  . Highest education level: Not on file  Occupational History  . Not on file  Social Needs  . Financial resource strain: Not on file  . Food insecurity:    Worry: Not on file    Inability: Not on file  . Transportation needs:    Medical: Not on file    Non-medical: Not on file  Tobacco Use  . Smoking status: Current Some Day Smoker  . Smokeless tobacco: Current User  Substance and Sexual Activity  . Alcohol use: Yes    Comment: occasional  . Drug use: Never  . Sexual activity: Not on file  Lifestyle  . Physical activity:    Days per week: Not on file    Minutes per session: Not on file  . Stress: Not on file  Relationships  . Social connections:    Talks on phone: Not on file    Gets together: Not on file    Attends religious service: Not on file    Active member of club or organization: Not on file    Attends meetings of clubs or organizations: Not on file    Relationship status: Not on file  . Intimate partner violence:    Fear of current or ex partner: Not on file    Emotionally abused: Not on file    Physically abused: Not on file    Forced sexual activity: Not on file  Other Topics Concern  . Not on file  Social History Narrative  . Not on file    Current Outpatient Medications  Medication Sig Dispense Refill  . aspirin 81 MG chewable tablet Chew 81 mg by mouth daily.   3  . atorvastatin (LIPITOR) 10 MG tablet Take 1 tablet (10 mg total) by mouth daily at 6 PM. 90 tablet 2  . clopidogrel (PLAVIX) 75 MG tablet Take 1 tablet (75 mg total) by mouth daily. 30 tablet 0  . metoprolol tartrate (LOPRESSOR) 25 MG tablet Take 1 tablet (25 mg total) by mouth 2 (two) times daily. 180 tablet 1  . pantoprazole (PROTONIX) 40 MG tablet Take 1 tablet (40 mg total) by mouth daily. 90 tablet 3   No current facility-administered medications for this visit.     No Known Allergies    Review of Systems:   General:  normal appetite, normal energy, no weight gain, no  weight loss, no fever  Cardiac:  + chest pain with exertion, no chest pain at rest, +SOB with  exertion, no resting SOB, no PND, no orthopnea, no palpitations, no arrhythmia, no atrial fibrillation, + LE edema, + dizzy spells, no syncope  Respiratory:  exertional shortness of breath, no home oxygen, no productive cough, no dry cough, no bronchitis, no wheezing, no hemoptysis, no asthma, no pain with inspiration or cough, no sleep apnea, no CPAP at night  GI:   some difficulty swallowing, + reflux, no frequent heartburn, + hiatal hernia, no abdominal pain, + constipation,  no diarrhea, no hematochezia, no hematemesis, no melena  GU:   no dysuria,  + frequency, no urinary tract infection, no hematuria, no enlarged prostate, no kidney stones, no kidney disease  Vascular:  no pain suggestive of claudication, no pain in feet, no leg cramps, + varicose veins, no DVT, hx of  non-healing  right leg ulcers  Neuro:   no stroke, no TIA's, no seizures, no headaches, no temporary blindness one eye,  no slurred speech, no peripheral neuropathy, no chronic pain, no instability of gait, no memory/cognitive dysfunction  Musculoskeletal: no arthritis, no joint swelling, + myalgias, no difficulty walking, no mobility   Skin:   no rash, no itching, no skin infections, no pressure sores or ulcerations  Psych:   no anxiety, no depression, no nervousness, no unusual recent stress  Eyes:   + blurry vision, no floaters, no recent vision changes, does not wear glasses or contacts  ENT:   no hearing loss, no loose or painful teeth, no dentures, last saw dentist end of 2018  Hematologic:  + easy bruising, no abnormal bleeding, no clotting disorder, no frequent epistaxis  Endocrine:  no diabetes, does not check CBG's at home     Physical Exam:   BP 126/76   Pulse 87   Resp 20   Ht 5\' 10"  (1.778 m)   Wt 230 lb (104.3 kg)   SpO2 98% Comment: RA  BMI 33.00 kg/m   General:  Well-appearing  HEENT:  Unremarkable, NCAT,  PERLA, EOMI, oropharynx clear. Few remaining teeth in mandible anteriorly that are in fair condition.  Neck:   no JVD, no bruits, no adenopathy or thyromegaly  Chest:   clear to auscultation, symmetrical breath sounds, no wheezes, no rhonchi   CV:   RRR, grade III/VI crescendo/decrescendo murmur heard best at RSB,  no diastolic murmur  Abdomen:  soft, non-tender, no masses or organomegaly  Extremities:  warm, well-perfused, pulses palpable in feet, moderate RLE edema and large varicose veins. Trace edema in LLE and no significant varicose veins.  Rectal/GU  Deferred  Neuro:   Grossly non-focal and symmetrical throughout  Skin:   Clean and dry, no rashes, no breakdown   Diagnostic Tests:  Result status: Final result                         [facility]*                       [address]                        [place], [place] [address]                            [phone]  ------------------------------------------------------------------- Transthoracic Echocardiography  Patient:    Fue, Cervenka MR #:       [id number] Study Date: 11/22/2017 Gender:     M Age:        56 Height:     177.8 cm Weight:     105.2 kg BSA:        2.31 m^2 Pt. Status: Room:   ATTENDING    Belva Crome, MD  ORDERING     Belva Crome, MD  REFERRING    Belva Crome, MD  PERFORMING   [facility]  SONOGRAPHER  Jimmy Reel, RDCS  cc:  -------------------------------------------------------------------  LV EF: 55% -   60%  ------------------------------------------------------------------- Indications:      Aortic stenosis 424.1.  ------------------------------------------------------------------- History:   PMH:   Coronary artery disease.  Risk factors:  Current tobacco use.  ------------------------------------------------------------------- Study Conclusions  - Left ventricle: The cavity size was normal. Wall thickness was   increased in a  pattern of mild LVH. Systolic function was normal.   The estimated ejection fraction was in the range of 55% to 60%.   Wall motion was normal; there were no regional wall motion   abnormalities. Features are consistent with a pseudonormal left   ventricular filling pattern, with concomitant abnormal relaxation   and increased filling pressure (grade 2 diastolic dysfunction). - Aortic valve: There was trivial regurgitation. Valve area (VTI):   0.82 cm^2. Valve area (Vmax): 0.8 cm^2. Valve area (Vmean): 0.78   cm^2. - Mitral valve: Calcified annulus. Valve area by pressure   half-time: 2.24 cm^2. Valve area by continuity equation (using   LVOT flow): 3.22 cm^2. - Left atrium: The atrium was moderately dilated.  Impressions:  - Normal LVEF.   Mild LVH.   Aortic root aneurysm - 46 mm   Severe AS = mean 51 mmHg, AVA 0.8 cm2. Pseudonormal transmitral   pattern.   Mild MR.  ------------------------------------------------------------------- Study data:  No prior study was available for comparison.  Study status:  Routine.  Procedure:  The patient reported no pain pre or post test. Transthoracic echocardiography. Image quality was adequate.          Transthoracic echocardiography.  M-mode, complete 2D, spectral Doppler, and color Doppler.  Birthdate: Patient birthdate: 05/11/1961.  Age:  Patient is 57 yr old.  Sex: Gender: male.    BMI: 33.3 kg/m^2.  Blood pressure:     124/82 Patient status:  Outpatient.  Study date:  Study date: 11/22/2017. Study time: 02:48 PM.  Location:  Echo laboratory.  -------------------------------------------------------------------  ------------------------------------------------------------------- Left ventricle:  The cavity size was normal. Wall thickness was increased in a pattern of mild LVH. Systolic function was normal. The estimated ejection fraction was in the range of 55% to 60%. Wall motion was normal; there were no regional wall  motion abnormalities. Features are consistent with a pseudonormal left ventricular filling pattern, with concomitant abnormal relaxation and increased filling pressure (grade 2 diastolic dysfunction).  ------------------------------------------------------------------- Aortic valve:   Trileaflet; normal thickness leaflets. Mobility was not restricted.  Doppler:  Transvalvular velocity was within the normal range. There was no stenosis. There was trivial regurgitation.    VTI ratio of LVOT to aortic valve: 0.2. Valve area (VTI): 0.82 cm^2. Indexed valve area (VTI): 0.35 cm^2/m^2. Peak velocity ratio of LVOT to aortic valve: 0.19. Valve area (Vmax): 0.8 cm^2. Indexed valve area (Vmax): 0.35 cm^2/m^2. Mean velocity ratio of LVOT to aortic valve: 0.19. Valve area (Vmean): 0.78 cm^2. Indexed valve area (Vmean): 0.34 cm^2/m^2.    Mean gradient (S): 51 mm Hg. Peak gradient (S): 80 mm Hg.  ------------------------------------------------------------------- Aorta:  There was an aneurysm. Aortic root: The aortic root was moderately dilated.  ------------------------------------------------------------------- Mitral valve:   Calcified annulus. Mobility was not restricted. Doppler:  Transvalvular velocity was within the normal range. There was no evidence for stenosis. There was trivial regurgitation. Valve area by pressure half-time: 2.24 cm^2. Indexed valve area by pressure half-time: 0.97 cm^2/m^2. Valve area by continuity equation (using LVOT flow): 3.22 cm^2. Indexed valve area by continuity equation (using LVOT flow): 1.39 cm^2/m^2.    Mean gradient (D): 2 mm Hg. Peak gradient (  D): 4 mm Hg.  ------------------------------------------------------------------- Left atrium:  The atrium was moderately dilated.  ------------------------------------------------------------------- Right ventricle:  The cavity size was normal. Wall thickness was normal. Systolic function was  normal.  ------------------------------------------------------------------- Pulmonic valve:    Doppler:  Transvalvular velocity was within the normal range. There was no evidence for stenosis.  ------------------------------------------------------------------- Tricuspid valve:   Structurally normal valve.    Doppler: Transvalvular velocity was within the normal range. There was no regurgitation.  ------------------------------------------------------------------- Pulmonary artery:   The main pulmonary artery was normal-sized. Systolic pressure was within the normal range.  ------------------------------------------------------------------- Right atrium:  The atrium was normal in size.  ------------------------------------------------------------------- Pericardium:  There was no pericardial effusion.  ------------------------------------------------------------------- Systemic veins: Inferior vena cava: The vessel was normal in size.  ------------------------------------------------------------------- Measurements   Left ventricle                            Value          Reference  LV ID, ED, PLAX chordal                   44.9  mm       43 - 52  LV ID, ES, PLAX chordal                   29.3  mm       23 - 38  LV fx shortening, PLAX chordal            35    %        >=29  LV PW thickness, ED                       18.5  mm       ---------  IVS/LV PW ratio, ED                       0.94           <=1.3  Stroke volume, 2D                         98    ml       ---------  Stroke volume/bsa, 2D                     42    ml/m^2   ---------  LV e&', lateral                            7.62  cm/s     ---------  LV E/e&', lateral                          13.52          ---------  LV e&', medial                             6.53  cm/s     ---------  LV E/e&', medial                           15.77          ---------  LV e&', average  7.08  cm/s      ---------  LV E/e&', average                          14.56          ---------  Longitudinal strain, TDI                  14    %        ---------    Ventricular septum                        Value          Reference  IVS thickness, ED                         17.3  mm       ---------    LVOT                                      Value          Reference  LVOT ID, S                                23    mm       ---------  LVOT area                                 4.15  cm^2     ---------  LVOT peak velocity, S                     85.7  cm/s     ---------  LVOT mean velocity, S                     62.3  cm/s     ---------  LVOT VTI, S                               23.6  cm       ---------    Aortic valve                              Value          Reference  Aortic valve peak velocity, S             447   cm/s     ---------  Aortic valve mean velocity, S             332   cm/s     ---------  Aortic valve VTI, S                       119   cm       ---------  Aortic mean gradient, S                   51    mm Hg    ---------  Aortic peak gradient, S  80    mm Hg    ---------  VTI ratio, LVOT/AV                        0.2            ---------  Aortic valve area, VTI                    0.82  cm^2     ---------  Aortic valve area/bsa, VTI                0.35  cm^2/m^2 ---------  Velocity ratio, peak, LVOT/AV             0.19           ---------  Aortic valve area, peak velocity          0.8   cm^2     ---------  Aortic valve area/bsa, peak               0.35  cm^2/m^2 ---------  velocity  Velocity ratio, mean, LVOT/AV             0.19           ---------  Aortic valve area, mean velocity          0.78  cm^2     ---------  Aortic valve area/bsa, mean               0.34  cm^2/m^2 ---------  velocity    Aorta                                     Value          Reference  Aortic root ID, ED                        43    mm       ---------  Ascending aorta ID, A-P, S                 46    mm       ---------    Left atrium                               Value          Reference  LA ID, A-P, ES                            41    mm       ---------  LA ID/bsa, A-P                            1.77  cm/m^2   <=2.2  LA volume, S                              102   ml       ---------  LA volume/bsa, S                          44.1  ml/m^2   ---------  LA volume, ES, 1-p A4C  74.1  ml       ---------  LA volume/bsa, ES, 1-p A4C                32    ml/m^2   ---------  LA volume, ES, 1-p A2C                    135   ml       ---------  LA volume/bsa, ES, 1-p A2C                58.3  ml/m^2   ---------    Mitral valve                              Value          Reference  Mitral E-wave peak velocity               103   cm/s     ---------  Mitral A-wave peak velocity               103   cm/s     ---------  Mitral mean velocity, D                   64.1  cm/s     ---------  Mitral deceleration time           (H)    264   ms       150 - 230  Mitral pressure half-time                 98    ms       ---------  Mitral mean gradient, D                   2     mm Hg    ---------  Mitral peak gradient, D                   4     mm Hg    ---------  Mitral E/A ratio, peak                    1              ---------  Mitral valve area, PHT, DP                2.24  cm^2     ---------  Mitral valve area/bsa, PHT, DP            0.97  cm^2/m^2 ---------  Mitral valve area, LVOT continuity        3.22  cm^2     ---------  Mitral valve area/bsa, LVOT               1.39  cm^2/m^2 ---------  continuity  Mitral annulus VTI, D                     30.4  cm       ---------    Systemic veins                            Value          Reference  Estimated CVP  3     mm Hg    ---------    Right ventricle                           Value          Reference  TAPSE                                     21.4  mm       ---------  RV s&', lateral, S                          6.53  cm/s     ---------  Legend: (L)  and  (H)  mark values outside specified reference range.  ------------------------------------------------------------------- Prepared and Electronically Authenticated by  Gypsy Balsam, MD 2019-04-11T17:01:59    Physicians   Panel Physicians Referring Physician Case Authorizing Physician  Lyn Records, MD (Primary)    Procedures   RIGHT/LEFT HEART CATH AND CORONARY ANGIOGRAPHY  Conclusion    Technically difficult procedure performed from the right radial and antecubital sites for left and right heart catheterization.  Severe aortic stenosis previously documented by echocardiography.  The valve was crossed twice using an Amplatz and 0.035 straight wire, however after removal of the wire the catheter was propelled back into the ascending aorta due to hydraulic forces.  Therefore, unable to measure LVEDP or gradient.  Normal right heart pressures with mean capillary wedge 8 mmHg.  Severe diffuse coronary disease with 50% ostial and 40% distal left main, eccentric 75% stenosis distal to the previously placed proximal to mid LAD stent, high-grade obstruction in the second obtuse marginal, and high-grade obstruction in the second left ventricular branch.   Recent CT scan demonstrates ascending aortic aneurysm measuring 4.7 cm.  RECOMMENDATIONS:   Referred to [facility] for aortic valve replacement, aortic root replacement, and coronary bypass grafting.  Indications   Aortic stenosis, severe [I35.0 (ICD-10-CM)]  CAD in native artery [I25.10 (ICD-10-CM)]  Procedural Details/Technique   Technical Details The right radial area was sterilely prepped and draped. Intravenous sedation with Versed and fentanyl was administered. 1% Xylocaine was infiltrated to achieve local analgesia. Using real-time vascular ultrasound, a double wall stick with an angiocath was utilized to obtain intra-arterial access. A VUS image was saved for the  permanent record.The modified Seldinger technique was used to place a 52F " Slender" sheath in the right radial artery. Weight based heparin was administered. Coronary angiography was done using 5 F catheters. Right coronary angiography was performed with a 25F JR4 guide catheter. Multiple other 5 French catheters were used before upgrading to 6 [place] without success. Right coronary was difficult to intubate related to angulation in the innominate aorta bifurcation and aortic stenosis jet which tended to displace the catheter due to hydraulic forces. Left ventricular hemodymic recordings and angiography was not done because on 2 occasions after crossing the valve, the hydraulic jet would propel the Amplatz catheter back into the ascending aorta. Left coronary angiography was not.  Right heart catheterization was performed by exchanging a previously placed antecubital IV angio-cath for a 5 French Slender sheath. 1% Xylocaine was used to locally nesthetize the area around the IV site. The IV catheter was wired using an .018 guidewire. The modified Seldinger technique was used to place the 5 [place] sheath. Double glove technique was  used to enhance sterility. After sheath insertion, right heart cath was performed using a 5 French balloon tipped catheter and fluoroscopic guidance. Pressures were recorded in each chamber and in the pulmonary capillary wedge position.. The main pulmonary artery O2 saturation was sampled.   Hemostasis was achieved using a pneumatic band.  During this procedure the patient is administered a total of Versed 1 mg and Fentanyl 75 mg to achieve and maintain moderate conscious sedation. The patient's heart rate, blood pressure, and oxygen saturation are monitored continuously during the procedure. The period of conscious sedation is 77 minutes, of which I was present face-to-face 100% of this time.   Estimated blood loss <50 mL.  During this procedure the patient was administered the  following to achieve and maintain moderate conscious sedation: Versed 1 mg, Fentanyl 75 mcg, while the patient's heart rate, blood pressure, and oxygen saturation were continuously monitored. The period of conscious sedation was 77 minutes, of which I was present face-to-face 100% of this time.  Coronary Findings   Diagnostic  Dominance: Right  Left Main  Ost LM lesion 50% stenosed  Ost LM lesion is 50% stenosed.  Ost LM to Dist LM lesion 40% stenosed  Ost LM to Dist LM lesion is 40% stenosed.  Left Anterior Descending  Ost LAD to Prox LAD lesion 10% stenosed  Ost LAD to Prox LAD lesion is 10% stenosed. The lesion was previously treated.  Prox LAD lesion 75% stenosed  Prox LAD lesion is 75% stenosed.  Left Circumflex  Ost Cx lesion 60% stenosed  Ost Cx lesion is 60% stenosed.  First Obtuse Marginal Branch  Ost 1st Mrg to 1st Mrg lesion 60% stenosed  Ost 1st Mrg to 1st Mrg lesion is 60% stenosed.  Second Obtuse Marginal Branch  Ost 2nd Mrg lesion 40% stenosed  Ost 2nd Mrg lesion is 40% stenosed.  Third Obtuse Marginal Branch  Ost 3rd Mrg to 3rd Mrg lesion 85% stenosed  Ost 3rd Mrg to 3rd Mrg lesion is 85% stenosed.  Right Coronary Artery  Mid RCA to Dist RCA lesion 45% stenosed  Mid RCA to Dist RCA lesion is 45% stenosed.  First Right Posterolateral  Vessel is small in size.  Second Right Posterolateral  2nd RPLB lesion 95% stenosed  2nd RPLB lesion is 95% stenosed.  Intervention   No interventions have been documented.  Right Heart   Right Heart Pressures Hemodynamic findings consistent with pulmonary hypertension.  Coronary Diagrams   Diagnostic Diagram       Implants    No implant documentation for this case.  MERGE Images   Show images for CARDIAC CATHETERIZATION   Link to Procedure Log   Procedure Log    Hemo Data    Most Recent Value  Fick Cardiac Output 6.15 L/min  Fick Cardiac Output Index 2.81 (L/min)/BSA  RA A Wave 7 mmHg  RA V Wave 7 mmHg  RA  Mean 4 mmHg  RV Systolic Pressure 27 mmHg  RV Diastolic Pressure 0 mmHg  RV EDP 5 mmHg  PA Systolic Pressure 28 mmHg  PA Diastolic Pressure 8 mmHg  PA Mean 16 mmHg  PW A Wave 11 mmHg  PW V Wave 10 mmHg  PW Mean 8 mmHg  AO Systolic Pressure 130 mmHg  AO Diastolic Pressure 73 mmHg  AO Mean 97 mmHg  QP/QS 1  TPVR Index 5.7 HRUI  TSVR Index 34.55 HRUI  PVR SVR Ratio 0.09  TPVR/TSVR Ratio 0.16    CLINICAL DATA:  Follow-up  thoracic aortic aneurysm  EXAM: CT ANGIOGRAPHY CHEST WITH CONTRAST  TECHNIQUE: Multidetector CT imaging of the chest was performed using the standard protocol during bolus administration of intravenous contrast. Multiplanar CT image reconstructions and MIPs were obtained to evaluate the vascular anatomy.  CONTRAST:  ISOVUE-370 IOPAMIDOL (ISOVUE-370) INJECTION 76%  COMPARISON:  None.  FINDINGS: Cardiovascular: Ascending thoracic aorta is aneurysmal measuring up to 4.7 cm. Diffuse aortic valve calcifications. Moderate to severe coronary artery calcifications within the left anterior descending, left circumflex and right coronary arteries. Left main coronary artery calcifications also noted. No evidence of aortic dissection.  Mediastinum/Nodes: Small hiatal hernia. No mediastinal, hilar, or axillary adenopathy.  Lungs/Pleura: Lungs are clear. No focal airspace opacities or suspicious nodules. No effusions.  Upper Abdomen: Prior cholecystectomy.  No acute findings.  Musculoskeletal: Chest wall soft tissues are unremarkable. No acute bony abnormality. Degenerative changes diffusely throughout the thoracic spine.  Review of the MIP images confirms the above findings.  IMPRESSION: 4.7 cm ascending thoracic aortic aneurysm. Recommend semi-annual imaging followup by CTA or MRA and referral to cardiothoracic surgery if not already obtained. This recommendation follows 2010 ACCF/AHA/AATS/ACR/ASA/SCA/SCAI/SIR/STS/SVM Guidelines for  the Diagnosis and Management of Patients With Thoracic Aortic Disease. Circulation. 2010; 121: Z610-R604  Small hiatal hernia.  Extensive coronary artery disease. Aortic valve calcifications diffusely. Aortic atherosclerosis.   Electronically Signed   By: Charlett Nose M.D.   On: 11/23/2017 08:00   Impression:  This 57 year old gentleman has stage D, severe, symptomatic aortic stenosis with New York Heart Association class II symptoms of exertional shortness of breath consistent with chronic diastolic congestive heart failure.  He also has exertional chest discomfort and is starting to have some episodes of dizziness.  I have personally reviewed his echocardiogram, cardiac catheterization, and CTA of the chest.  His echocardiogram shows a trileaflet aortic valve with severe calcification and a mean gradient of 51 mmHg consistent with severe aortic stenosis.  His cardiac catheterization shows moderate proximal and distal LAD stenosis as well as high-grade stenoses in the LAD, obtuse marginal, and posterior lateral branch of the right coronary artery.  The CTA of the chest shows dilation of the aortic root and ascending aorta to 4.7 cm.  The aorta does not return to a more normal size until after the innominate artery.  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  I think the best option for treating this patient is coronary bypass graft surgery as well as Bentall procedure to replace the aortic valve and aortic root with replacement of the ascending aorta and proximal aortic arch to a point between the innominate artery and left common carotid artery.  He would require a bypass from the ascending aortic graft up to the innominate artery.  This would require right axillary artery cannulation for circulatory arrest.  I reviewed the echo and cath findings with him as well as the CTA images.  I discussed the alternatives of mechanical and bioprosthetic valves.  Both he and his  daughter-in-law feel that a bioprosthetic valve would be the best option for him given his living situation and alcohol use.  I think that is a reasonable option for him and will have less risk over the long-term.  He understands that there is an increased risk of structural valve deterioration with a bioprosthetic valve and accepts that risk.  He has bad varicose veins in the right lower extremity so vein harvesting there would not be an option.  He does not have any varicose veins in the  left lower extremity and hopefully that vein will be suitable along with a left internal mammary graft. I discussed the operative procedure with the patient and his daughter-in-law including alternatives, benefits and risks; including but not limited to bleeding, blood transfusion, infection, stroke, myocardial infarction, graft failure, heart block requiring a permanent pacemaker, organ dysfunction, and death.  [name] understands and agrees to proceed.     Plan:  The patient will think about the timing of surgery and discussed with his family and will call back to schedule coronary bypass graft surgery, Bentall procedure with replacement of his ascending aorta and proximal aortic arch, aorto innominate bypass, right axillary artery cannulation.  I will plan to use a bioprosthetic valve.  He will require bilateral radial arterial lines for surgery.  He has been on Plavix for his previous stents and that would need to be discontinued at least 7 days prior to surgery.   I spent 80 minutes performing this consultation and > 50% of this time was spent face to face counseling and coordinating the care of this patient's severe symptomatic aortic stenosis and multivessel coronary disease and ascending aortic aneurysm.   Alleen Borne, MD 01/03/2018

## 2018-01-18 ENCOUNTER — Ambulatory Visit (INDEPENDENT_AMBULATORY_CARE_PROVIDER_SITE_OTHER): Payer: BLUE CROSS/BLUE SHIELD | Admitting: Cardiology

## 2018-01-18 ENCOUNTER — Encounter: Payer: Self-pay | Admitting: Cardiology

## 2018-01-18 VITALS — BP 126/68 | HR 80 | Ht 70.0 in | Wt 229.1 lb

## 2018-01-18 DIAGNOSIS — I350 Nonrheumatic aortic (valve) stenosis: Secondary | ICD-10-CM

## 2018-01-18 DIAGNOSIS — I712 Thoracic aortic aneurysm, without rupture, unspecified: Secondary | ICD-10-CM

## 2018-01-18 DIAGNOSIS — I2510 Atherosclerotic heart disease of native coronary artery without angina pectoris: Secondary | ICD-10-CM

## 2018-01-18 DIAGNOSIS — Z87891 Personal history of nicotine dependence: Secondary | ICD-10-CM

## 2018-01-18 NOTE — Progress Notes (Signed)
Cardiology Office Note:    Date:  01/18/2018   ID:  Tyler Kemp, DOB 04-22-1961, MRN [id number]  PCP:  Practice, [facility]  Cardiologist:  Garwin Brothers, MD   Referring MD: Practice, Du[facility] Limerick*    ASSESSMENT:    1. Coronary artery disease involving native coronary artery of native heart without angina pectoris   2. Severe aortic stenosis   3. Thoracic aortic aneurysm without rupture (HCC)   4. Ex-smoker    PLAN:    In order of problems listed above:  1. Secondary prevention stressed with the patient.  Importance of compliance with diet and medications stressed and he vocalized understanding.  I congratulated him for quitting smoking.  He tells me quit since the last time he saw me and is very happy about the way he feels. 2. I discussed with him that surgery would be a very strong option for him especially in view of his age and overall general good health and the fact that he is very symptomatic with activity.  This would be essential for improving his quality of life and reducing his morbidity mortality from the above significant issues that he has he and his daughter-in-law vocalized understanding and multiple questions were answered to their satisfaction.  He will have blood work in the next few days including lipid check done at the local lab and have them faxed to [place].  I told him to give [place] a call if they do not hear within a week of doing the lab work and they understood 3. He will be seen in follow-up appointment after the surgery.   Medication Adjustments/Labs and Tests Ordered: Current medicines are reviewed at length with the patient today.  Concerns regarding medicines are outlined above.  Orders Placed This Encounter  Procedures  . Basic metabolic panel  . CBC  . TSH  . Hepatic function panel  . Lipid Profile   No orders of the defined types were placed in this encounter.    No chief complaint on file.    History of Present Illness:     Tyler Kemp is a 57 y.o. male.  Patient has severe aortic stenosis, thoracic aortic aneurysm and coronary artery disease.  He was evaluated by her surgery colleague and thought to be appropriate for surgical intervention.  The patient is here for follow-up and had multiple questions.  He leads a sedentary lifestyle and with his lifestyle he is fairly asymptomatic.  He mentions to me when he tries to have any activity done including activities of daily living he gets short of breath and is very significant from clearly his aortic stenosis.  His daughter-in-law is very supportive and accompanies him for all his visits.  At the time of my evaluation, the patient is alert awake oriented and in no distress.  Past Medical History:  Diagnosis Date  . Coronary artery disease   . Food impaction of esophagus 12/11/2017  . GERD (gastroesophageal reflux disease)   . Hiatal hernia   . Severe aortic stenosis   . Thoracic aortic aneurysm [facility])     Past Surgical History:  Procedure Laterality Date  . CHOLECYSTECTOMY    . ESOPHAGOGASTRODUODENOSCOPY (EGD) WITH PROPOFOL N/A 12/11/2017   Procedure: ESOPHAGOGASTRODUODENOSCOPY (EGD) WITH PROPOFOL;  Surgeon: Iva Boop, MD;  Location: [facility] ENDOSCOPY;  Service: Endoscopy;  Laterality: N/A;  . FOREIGN BODY REMOVAL N/A 12/11/2017   Procedure: FOREIGN BODY REMOVAL;  Surgeon: Iva Boop, MD;  Location: [facility] ENDOSCOPY;  Service: Endoscopy;  Laterality: N/A;  . RIGHT/LEFT HEART CATH AND CORONARY ANGIOGRAPHY N/A 12/04/2017   Procedure: RIGHT/LEFT HEART CATH AND CORONARY ANGIOGRAPHY;  Surgeon: Lyn RecordsSmith, Henry W, MD;  Location: [facility] INVASIVE CV LAB;  Service: Cardiovascular;  Laterality: N/A;    Current Medications: Current Meds  Medication Sig  . aspirin 81 MG chewable tablet Chew 81 mg by mouth daily.   Marland Kitchen. atorvastatin (LIPITOR) 10 MG tablet Take 1 tablet (10 mg total) by mouth daily at 6 PM.  . clopidogrel (PLAVIX) 75 MG tablet Take 1 tablet (75 mg total) by mouth  daily.  . pantoprazole (PROTONIX) 40 MG tablet Take 1 tablet (40 mg total) by mouth daily.     Allergies:   Patient has no known allergies.   Social History   Socioeconomic History  . Marital status: Single    Spouse name: Not on file  . Number of children: Not on file  . Years of education: Not on file  . Highest education level: Not on file  Occupational History  . Not on file  Social Needs  . Financial resource strain: Not on file  . Food insecurity:    Worry: Not on file    Inability: Not on file  . Transportation needs:    Medical: Not on file    Non-medical: Not on file  Tobacco Use  . Smoking status: Current Some Day Smoker  . Smokeless tobacco: Current User  Substance and Sexual Activity  . Alcohol use: Yes    Comment: occasional  . Drug use: Never  . Sexual activity: Not on file  Lifestyle  . Physical activity:    Days per week: Not on file    Minutes per session: Not on file  . Stress: Not on file  Relationships  . Social connections:    Talks on phone: Not on file    Gets together: Not on file    Attends religious service: Not on file    Active member of club or organization: Not on file    Attends meetings of clubs or organizations: Not on file    Relationship status: Not on file  Other Topics Concern  . Not on file  Social History Narrative  . Not on file     Family History: The patient's family history is not on file.  ROS:   Please see the history of present illness.    All other systems reviewed and are negative.  EKGs/Labs/Other Studies Reviewed:    The following studies were reviewed today: I discussed my findings with the patient at extensive length including the coronary angiography evaluation in the surgical notes.   Recent Labs: 11/20/2017: ALT 21; TSH 1.890 11/26/2017: BUN 14; Creatinine, Ser 1.09; Hemoglobin 12.9; Platelets 210; Potassium 4.9; Sodium 138  Recent Lipid Panel    Component Value Date/Time   CHOL 223 (H)  11/20/2017 1507   TRIG 260 (H) 11/20/2017 1507   HDL 53 11/20/2017 1507   CHOLHDL 4.2 11/20/2017 1507   LDLCALC 118 (H) 11/20/2017 1507    Physical Exam:    VS:  BP 126/68 (BP Location: Left Arm, Patient Position: Sitting, Cuff Size: Normal)   Pulse 80   Ht 5\' 10"  (1.778 m)   Wt 229 lb 1.9 oz (103.9 kg)   SpO2 98%   BMI 32.88 kg/m     Wt Readings from Last 3 Encounters:  01/18/18 229 lb 1.9 oz (103.9 kg)  01/03/18 230 lb (104.3 kg)  12/11/17 230 lb (104.3 kg)  GEN: Patient is in no acute distress HEENT: Normal NECK: No JVD; No carotid bruits LYMPHATICS: No lymphadenopathy CARDIAC: Hear sounds regular, 2/6 systolic murmur at the apex. RESPIRATORY:  Clear to auscultation without rales, wheezing or rhonchi  ABDOMEN: Soft, non-tender, non-distended MUSCULOSKELETAL:  No edema; No deformity  SKIN: Warm and dry NEUROLOGIC:  Alert and oriented x 3 PSYCHIATRIC:  Normal affect   Signed, Garwin Brothers, MD  01/18/2018 11:44 AM     [facility]

## 2018-01-18 NOTE — Patient Instructions (Signed)
Medication Instructions:  Your physician recommends that you continue on your current medications as directed. Please refer to the Current Medication list given to you today.  Labwork: Your physician recommends that you return for lab work at the [facility] in [place]. You need to be fasting. BMP, CBC, TSH, LFT, lipids. Our fax # is [phone].  Testing/Procedures: None  Follow-Up: Your physician recommends that you schedule a follow-up appointment after your surgery.  Any Other Special Instructions Will Be Listed Below (If Applicable).     If you need a refill on your cardiac medications before your next appointment, please call your pharmacy.

## 2018-02-01 ENCOUNTER — Other Ambulatory Visit: Payer: Self-pay | Admitting: *Deleted

## 2018-02-01 DIAGNOSIS — I350 Nonrheumatic aortic (valve) stenosis: Secondary | ICD-10-CM

## 2018-02-01 DIAGNOSIS — I712 Thoracic aortic aneurysm, without rupture, unspecified: Secondary | ICD-10-CM

## 2018-02-01 DIAGNOSIS — I2510 Atherosclerotic heart disease of native coronary artery without angina pectoris: Secondary | ICD-10-CM

## 2018-02-05 NOTE — Anesthesia Preprocedure Evaluation (Addendum)
Anesthesia Evaluation  Patient identified by MRN, date of birth, ID band Patient awake    Reviewed: Allergy & Precautions, NPO status , Patient's Chart, lab work & pertinent test results  History of Anesthesia Complications (+) PONV and history of anesthetic complications  Airway        Dental   Pulmonary pneumonia, Current Smoker, former smoker,           Cardiovascular + CAD and + Peripheral Vascular Disease       Neuro/Psych negative neurological ROS  negative psych ROS   GI/Hepatic Neg liver ROS, hiatal hernia, GERD  ,  Endo/Other  negative endocrine ROS  Renal/GU negative Renal ROS  negative genitourinary   Musculoskeletal negative musculoskeletal ROS (+)   Abdominal   Peds negative pediatric ROS (+)  Hematology negative hematology ROS (+)   Anesthesia Other Findings   Reproductive/Obstetrics negative OB ROS                            Anesthesia Physical Anesthesia Plan  ASA: IV  Anesthesia Plan: General   Post-op Pain Management:    Induction: Intravenous  PONV Risk Score and Plan: Treatment may vary due to age or medical condition  Airway Management Planned: Oral ETT  Additional Equipment: Arterial line, CVP, PA Cath, TEE, 3D TEE and Ultrasound Guidance Line Placement  Intra-op Plan:   Post-operative Plan: Post-operative intubation/ventilation  Informed Consent: I have reviewed the patients History and Physical, chart, labs and discussed the procedure including the risks, benefits and alternatives for the proposed anesthesia with the patient or authorized representative who has indicated his/her understanding and acceptance.   Dental advisory given  Plan Discussed with: CRNA  Anesthesia Plan Comments: (Per Ryan RN at [facility], Dr. Laneta SimmersBartle requests right axillary cannulation and needs bilateral radial arterial lines. Shonna ChockAllison Zelenak, PA-C )       Anesthesia Quick  Evaluation                                  Anesthesia Evaluation  Patient identified by MRN, date of birth, ID band Patient awake    Reviewed: Allergy & Precautions, NPO status , Patient's Chart, lab work & pertinent test results  Airway Mallampati: II  TM Distance: >3 FB Neck ROM: Full    Dental no notable dental hx. (+) Edentulous Upper, Poor Dentition   Pulmonary neg pulmonary ROS, Current Smoker,    Pulmonary exam normal breath sounds clear to auscultation       Cardiovascular + CAD  negative cardio ROS Normal cardiovascular exam Rhythm:Regular Rate:Normal + Systolic murmurs    Neuro/Psych negative neurological ROS  negative psych ROS   GI/Hepatic negative GI ROS, Neg liver ROS, GERD  ,  Endo/Other  negative endocrine ROS  Renal/GU negative Renal ROS  negative genitourinary   Musculoskeletal negative musculoskeletal ROS (+)   Abdominal   Peds negative pediatric ROS (+)  Hematology negative hematology ROS (+)   Anesthesia Other Findings   Reproductive/Obstetrics negative OB ROS                            Anesthesia Physical Anesthesia Plan  ASA: III  Anesthesia Plan: General   Post-op Pain Management:    Induction: Intravenous, Rapid sequence and Cricoid pressure planned  PONV Risk Score and Plan: 1 and Ondansetron and Treatment may vary  due to age or medical condition  Airway Management Planned: Oral ETT  Additional Equipment:   Intra-op Plan:   Post-operative Plan: Extubation in OR  Informed Consent: I have reviewed the patients History and Physical, chart, labs and discussed the procedure including the risks, benefits and alternatives for the proposed anesthesia with the patient or authorized representative who has indicated his/her understanding and acceptance.   Dental advisory given  Plan Discussed with: CRNA  Anesthesia Plan Comments:         Anesthesia Quick Evaluation

## 2018-02-05 NOTE — Progress Notes (Addendum)
Anesthesia Chart Review:  Case:  [id number] Date/Time:  02/18/18 0715   Procedures:      CORONARY ARTERY BYPASS GRAFTING (CABG) (N/A Chest)     BENTALL PROCEDURE (N/A Chest) - RIGHT AXILLARY CANNULATION  CIRC ARREST  BILATERAL RADIAL ARTERIAL LINES     REPLACEMENT ASCENDING AORTA (N/A )     TRANSESOPHAGEAL ECHOCARDIOGRAM (TEE) (N/A )   Anesthesia type:  General   Pre-op diagnosis:      CAD     AS     TAA   [facility]:  [facility] RO[facility] / [facility] OR   Surgeon:  Alleen BorneBartle, Bryan K, MD      DISCUSSION: Patient is a 57 year old male scheduled for the above procedure.   History includes CAD (s/p PCI, [place]), severe AS, ascending TAA, esophageal food impaction (s/p EGD with removal of food barbecue) 12/11/17, hiatal hernia (1-2 cm sliding by 12/11/17 EGD), GERD, former smoker (quit 09/14/17). According to notes in Epic, he reported drinking alcohol every weekend which may include a pint or more of liquor. [place] lives alone in [place]. OSA screening score was 6.  According to PAT RN notes, patient reported decreased hearing and left face may be slightly swollen. Anesthesia APP was not consulted, but RN did notify [facility] RN Ryan. CBC WNL. I spoke with Alycia Rossettiyan 02/15/18. She has reviewed with Dr. Laneta SimmersBartle and is following up with patient to re-evaluate symptoms. Plans to proceed as scheduled will depend on any how he is doing. Last Plavix 02/10/18.    Per [facility]lycia [facility]tiyan [facility] at [facility], Dr. Laneta SimmersBartle "wants right axillary cannulation and needs bilateral radial arterial lines."    VS: BP (!) 145/81   Pulse 70   Temp 36.5 C (Oral)   Resp 20   Ht 5' 7.5" (1.715 m)   Wt 228 lb 1 oz (103.4 kg)   SpO2 100%   BMI 35.19 kg/m   PROVIDERS: Practice, [facility] listed as [facility] practice Revankar, Glean Hessajan, MD is cardiologist. Last visit 01/18/18. Stan HeadGessner, Carl, MD is GI  LABS: Preoperative labs reviewed and appear acceptable for surgery. (all labs ordered are listed, but only abnormal results are displayed)  Labs Reviewed   BLOOD GAS, ARTERIAL - Abnormal; Notable for the following components:      Result Value   pO2, Arterial 126 (*)    All other components within normal limits  COMPREHENSIVE METABOLIC PANEL - Abnormal; Notable for the following components:   CO2 20 (*)    All other components within normal limits  SURGICAL PCR SCREEN  APTT  CBC  HEMOGLOBIN A1C  PROTIME-INR  URINALYSIS, ROUTINE W REFLEX MICROSCOPIC  TYPE AND SCREEN  ABO/RH    OTHER:  PFTs 02/13/18: FVC 3.89 (87%), FEV1 2.99 (88%), DLCO unc 25.22 (85%).  IMAGES: CXR 02/13/18:  IMPRESSION: Cardiomegaly without evidence of active cardiopulmonary disease.  CTA chest 11/22/17: IMPRESSION: - 4.7 cm ascending thoracic aortic aneurysm. Recommend semi-annual imaging followup by CTA or MRA and referral to cardiothoracic surgery if not already obtained. This recommendation follows 2010 ACCF/AHA/AATS/ACR/ASA/SCA/SCAI/SIR/STS/SVM Guidelines for the Diagnosis and Management of Patients With Thoracic Aortic Disease. Circulation. 2010; 121: [id number] - Small hiatal hernia. - Extensive coronary artery disease. Aortic valve calcifications diffusely. Aortic atherosclerosis.   EKG: 02/06/18 [facility]): NSR, voltage criteria for LVH.    CV: Cardiac cath 12/04/17:  Technically difficult procedure performed from the right radial and antecubital sites for left and right heart catheterization.  Severe aortic stenosis previously documented by echocardiography.  The valve was crossed twice using an Amplatz and 0.035 straight wire, however after removal of the wire the catheter was propelled back into the ascending aorta due to hydraulic forces.  Therefore, unable to measure LVEDP or gradient.  Normal right heart pressures with mean capillary wedge 8 mmHg.  Severe diffuse coronary disease with 50% ostial and 40% distal left main, eccentric 75% stenosis distal to the previously placed proximal to mid LAD stent, high-grade obstruction in the second  obtuse marginal, and high-grade obstruction in the second left ventricular branch.   Recent CT scan demonstrates ascending aortic aneurysm measuring 4.7 cm. RECOMMENDATIONS:  Referred to [facility] for aortic valve replacement, aortic root replacement, and coronary bypass grafting.  Echo 11/22/17: Study Conclusions - Left ventricle: The cavity size was normal. Wall thickness was   increased in a pattern of mild LVH. Systolic function was normal.   The estimated ejection fraction was in the range of 55% to 60%.   Wall motion was normal; there were no regional wall motion   abnormalities. Features are consistent with a pseudonormal left   ventricular filling pattern, with concomitant abnormal relaxation   and increased filling pressure (grade 2 diastolic dysfunction). - Aortic valve: There was trivial regurgitation. Valve area (VTI):   0.82 cm^2. Valve area (Vmax): 0.8 cm^2. Valve area (Vmean): 0.78   cm^2. - Mitral valve: Calcified annulus. Valve area by pressure   half-time: 2.24 cm^2. Valve area by continuity equation (using   LVOT flow): 3.22 cm^2. - Left atrium: The atrium was moderately dilated. Impressions: - Normal LVEF.   Mild LVH.   Aortic root aneurysm - 46 mm   Severe AS = mean 51 mmHg, AVA 0.8 cm2. Pseudonormal transmitral   pattern.   Mild MR.  Carotid U/S 02/13/18:  Final Interpretation: - Right Carotid: Velocities in the right ICA are consistent with a 1-39% stenosis. - Left Carotid: Velocities in the left ICA are consistent with a 1-39% stenosis. Vertebrals: Bilateral vertebral arteries demonstrate antegrade flow.   Past Medical History:  Diagnosis Date  . Coronary artery disease   . Food impaction of esophagus 12/11/2017  . GERD (gastroesophageal reflux disease)   . Hiatal hernia   . Pneumonia   . PONV (postoperative nausea and vomiting)   . Severe aortic stenosis   . Thoracic aortic aneurysm [facility])     Past Surgical History:  Procedure Laterality Date  .  CHOLECYSTECTOMY    . ESOPHAGOGASTRODUODENOSCOPY (EGD) WITH PROPOFOL N/A 12/11/2017   Procedure: ESOPHAGOGASTRODUODENOSCOPY (EGD) WITH PROPOFOL;  Surgeon: Iva Boop, MD;  Location: [facility] ENDOSCOPY;  Service: Endoscopy;  Laterality: N/A;  . FOREIGN BODY REMOVAL N/A 12/11/2017   Procedure: FOREIGN BODY REMOVAL;  Surgeon: Iva Boop, MD;  Location[facility] ENDOSCOPY;  Service: Endoscopy;  Laterality: N/A;  . MULTIPLE TOOTH EXTRACTIONS    . RIGHT/LEFT HEART CATH AND CORONARY ANGIOGRAPHY N/A 12/04/2017   Procedure: RIGHT/LEFT HEART CATH AND CORONARY ANGIOGRAPHY;  Surgeon: Lyn Records, MD;  [facility]: [facility] INVASIVE CV LAB;  Service: Cardiovascular;  Laterality: N/A;    MEDICATIONS: . aspirin 81 MG chewable tablet  . atorvastatin (LIPITOR) 10 MG tablet  . clopidogrel (PLAVIX) 75 MG tablet  . diphenhydrAMINE (BENADRYL) 25 MG tablet  . metoprolol tartrate (LOPRESSOR) 25 MG tablet  . naproxen sodium (ALEVE) 220 MG tablet  . pantoprazole (PROTONIX) 40 MG tablet   No current facility-administered medications for this encounter.     Velna Ochs [facility] Short Stay Center/Anesthesiology Phone [phone] 02/15/2018 9:34  AM        

## 2018-02-06 DIAGNOSIS — Z87891 Personal history of nicotine dependence: Secondary | ICD-10-CM | POA: Diagnosis not present

## 2018-02-06 DIAGNOSIS — I350 Nonrheumatic aortic (valve) stenosis: Secondary | ICD-10-CM | POA: Diagnosis not present

## 2018-02-06 DIAGNOSIS — R42 Dizziness and giddiness: Secondary | ICD-10-CM | POA: Diagnosis not present

## 2018-02-06 DIAGNOSIS — Z7982 Long term (current) use of aspirin: Secondary | ICD-10-CM | POA: Diagnosis not present

## 2018-02-06 DIAGNOSIS — R112 Nausea with vomiting, unspecified: Secondary | ICD-10-CM | POA: Diagnosis not present

## 2018-02-06 DIAGNOSIS — Z7902 Long term (current) use of antithrombotics/antiplatelets: Secondary | ICD-10-CM | POA: Diagnosis not present

## 2018-02-06 DIAGNOSIS — I499 Cardiac arrhythmia, unspecified: Secondary | ICD-10-CM | POA: Diagnosis not present

## 2018-02-06 DIAGNOSIS — Z955 Presence of coronary angioplasty implant and graft: Secondary | ICD-10-CM | POA: Diagnosis not present

## 2018-02-06 DIAGNOSIS — I2510 Atherosclerotic heart disease of native coronary artery without angina pectoris: Secondary | ICD-10-CM | POA: Diagnosis not present

## 2018-02-06 DIAGNOSIS — Z79899 Other long term (current) drug therapy: Secondary | ICD-10-CM | POA: Diagnosis not present

## 2018-02-06 DIAGNOSIS — R55 Syncope and collapse: Secondary | ICD-10-CM | POA: Diagnosis not present

## 2018-02-06 DIAGNOSIS — K219 Gastro-esophageal reflux disease without esophagitis: Secondary | ICD-10-CM | POA: Diagnosis not present

## 2018-02-06 DIAGNOSIS — Z951 Presence of aortocoronary bypass graft: Secondary | ICD-10-CM | POA: Diagnosis not present

## 2018-02-12 NOTE — Pre-Procedure Instructions (Signed)
Remonia RichterDavid Accardi  02/12/2018      [id number] - [place] - [address] AT [facility] [address] [place] [address] Phone: [phone] Fax: [phone]    Your procedure is scheduled on July 8  [facility] Ad[facility] at 0530 A.M.  Call this number if you have problems the morning of surgery:  [phone]   Remember:  Do not eat or drink after midnight.     Take these medicines the morning of surgery with A SIP OF WATER  metoprolol tartrate (LOPRESSOR) pantoprazole (PROTONIX)  7 days prior to surgery STOP taking any Aspirin(unless otherwise instructed by your surgeon), Aleve, Naproxen, Ibuprofen, Motrin, Advil, Goody's, BC's, all herbal medications, fish oil, and all vitamins  Follow your doctors instructions regarding your Aspirin.  If no instructions were given by your doctor, then you will need to call the prescribing office office to get instructions.    Follow Physicians instruction about Plavix     Do not wear jewelry  Do not wear lotions, powders, or cologne, or deodorant.  Men may shave face and neck.  Do not bring valuables to the hospital.  [facility] is not responsible for any belongings or valuables.  Contacts, dentures or bridgework may not be worn into surgery.  Leave your suitcase in the car.  After surgery it may be brought to your room.  For patients admitted to the hospital, discharge time will be determined by your treatment team.  Patients discharged the day of surgery will not be allowed to drive home.    Special instructions:   [place]- Preparing For Surgery  Before surgery, you can play an important role. Because skin is not sterile, your skin needs to be as free of germs as possible. You can reduce the number of germs on your skin by washing with CHG (chlorahexidine gluconate) Soap before surgery.  CHG is an antiseptic cleaner which kills germs and bonds with the skin  to continue killing germs even after washing.    Oral Hygiene is also important to reduce your risk of infection.  Remember - BRUSH YOUR TEETH THE MORNING OF SURGERY WITH YOUR REGULAR TOOTHPASTE  Please do not use if you have an allergy to CHG or antibacterial soaps. If your skin becomes reddened/irritated stop using the CHG.  Do not shave (including legs and underarms) for at least 48 hours prior to first CHG shower. It is OK to shave your face.  Please follow these instructions carefully.   1. Shower the NIGHT BEFORE SURGERY and the MORNING OF SURGERY with CHG.   2. If you chose to wash your hair, wash your hair first as usual with your normal shampoo.  3. After you shampoo, rinse your hair and body thoroughly to remove the shampoo.  4. Use CHG as you would any other liquid soap. You can apply CHG directly to the skin and wash gently with a scrungie or a clean washcloth.   5. Apply the CHG Soap to your body ONLY FROM THE NECK DOWN.  Do not use on open wounds or open sores. Avoid contact with your eyes, ears, mouth and genitals (private parts). Wash Face and genitals (private parts)  with your normal soap.  6. Wash thoroughly, paying special attention to the area where your surgery will be performed.  7. Thoroughly rinse your body with warm water from the neck down.  8. DO NOT shower/wash with your normal soap after using and rinsing off the CHG Soap.  9. Pat yourself dry with a CLEAN TOWEL.  10. Wear CLEAN PAJAMAS to bed the night before surgery, wear comfortable clothes the morning of surgery  11. Place CLEAN SHEETS on your bed the night of your first shower and DO NOT SLEEP WITH PETS.    Day of Surgery:  Do not apply any deodorants/lotions.  Please wear clean clothes to the hospital/surgery center.   Remember to brush your teeth WITH YOUR REGULAR TOOTHPASTE.    Please read over the following fact sheets that you were given.

## 2018-02-13 ENCOUNTER — Ambulatory Visit (HOSPITAL_COMMUNITY)
Admission: RE | Admit: 2018-02-13 | Discharge: 2018-02-13 | Disposition: A | Discharge status: Home / Self Care | Payer: BLUE CROSS/BLUE SHIELD | Source: Ambulatory Visit | Attending: Surgery | Admitting: Surgery

## 2018-02-13 ENCOUNTER — Other Ambulatory Visit: Payer: Self-pay

## 2018-02-13 ENCOUNTER — Encounter (HOSPITAL_COMMUNITY)
Admission: RE | Admit: 2018-02-13 | Discharge: 2018-02-13 | Disposition: A | Discharge status: Home / Self Care | Payer: BLUE CROSS/BLUE SHIELD | Source: Ambulatory Visit | Attending: Surgery | Admitting: Surgery

## 2018-02-13 ENCOUNTER — Encounter (HOSPITAL_COMMUNITY): Payer: Self-pay

## 2018-02-13 ENCOUNTER — Ambulatory Visit (HOSPITAL_BASED_OUTPATIENT_CLINIC_OR_DEPARTMENT_OTHER)
Admission: RE | Admit: 2018-02-13 | Discharge: 2018-02-13 | Disposition: A | Discharge status: Home / Self Care | Payer: BLUE CROSS/BLUE SHIELD | Source: Ambulatory Visit | Attending: Surgery | Admitting: Surgery

## 2018-02-13 DIAGNOSIS — I2510 Atherosclerotic heart disease of native coronary artery without angina pectoris: Secondary | ICD-10-CM

## 2018-02-13 DIAGNOSIS — Z79899 Other long term (current) drug therapy: Secondary | ICD-10-CM | POA: Insufficient documentation

## 2018-02-13 DIAGNOSIS — I712 Thoracic aortic aneurysm, without rupture, unspecified: Secondary | ICD-10-CM

## 2018-02-13 DIAGNOSIS — I350 Nonrheumatic aortic (valve) stenosis: Secondary | ICD-10-CM | POA: Insufficient documentation

## 2018-02-13 DIAGNOSIS — J449 Chronic obstructive pulmonary disease, unspecified: Secondary | ICD-10-CM | POA: Diagnosis not present

## 2018-02-13 DIAGNOSIS — Z7982 Long term (current) use of aspirin: Secondary | ICD-10-CM | POA: Diagnosis not present

## 2018-02-13 DIAGNOSIS — K219 Gastro-esophageal reflux disease without esophagitis: Secondary | ICD-10-CM | POA: Diagnosis not present

## 2018-02-13 DIAGNOSIS — Z951 Presence of aortocoronary bypass graft: Secondary | ICD-10-CM | POA: Diagnosis not present

## 2018-02-13 DIAGNOSIS — Z9049 Acquired absence of other specified parts of digestive tract: Secondary | ICD-10-CM | POA: Diagnosis not present

## 2018-02-13 DIAGNOSIS — Z87891 Personal history of nicotine dependence: Secondary | ICD-10-CM | POA: Insufficient documentation

## 2018-02-13 DIAGNOSIS — I517 Cardiomegaly: Secondary | ICD-10-CM | POA: Insufficient documentation

## 2018-02-13 DIAGNOSIS — Z7902 Long term (current) use of antithrombotics/antiplatelets: Secondary | ICD-10-CM | POA: Insufficient documentation

## 2018-02-13 DIAGNOSIS — Z01818 Encounter for other preprocedural examination: Secondary | ICD-10-CM | POA: Diagnosis not present

## 2018-02-13 HISTORY — DX: Nausea with vomiting, unspecified: R11.2

## 2018-02-13 HISTORY — DX: Pneumonia, unspecified organism: J18.9

## 2018-02-13 HISTORY — DX: Other specified postprocedural states: R11.2

## 2018-02-13 HISTORY — DX: Other specified postprocedural states: Z98.890

## 2018-02-13 LAB — CBC
HEMATOCRIT: 41.1 % (ref 39.0–52.0)
HEMOGLOBIN: 13.1 g/dL (ref 13.0–17.0)
MCH: 27.5 pg (ref 26.0–34.0)
MCHC: 31.9 g/dL (ref 30.0–36.0)
MCV: 86.2 fL (ref 78.0–100.0)
Platelets: 168 10*3/uL (ref 150–400)
RBC: 4.77 MIL/uL (ref 4.22–5.81)
RDW: 13.2 % (ref 11.5–15.5)
WBC: 7.5 10*3/uL (ref 4.0–10.5)

## 2018-02-13 LAB — PULMONARY FUNCTION TEST
DL/VA % pred: 98 %
DL/VA: 4.45 ml/min/mmHg/L
DLCO unc % pred: 85 %
DLCO unc: 25.22 ml/min/mmHg
FEF 25-75 Post: 3.02 L/sec
FEF 25-75 Pre: 2.63 L/sec
FEF2575-%CHANGE-POST: 15 %
FEF2575-%PRED-PRE: 92 %
FEF2575-%Pred-Post: 106 %
FEV1-%Change-Post: 5 %
FEV1-%PRED-POST: 92 %
FEV1-%PRED-PRE: 88 %
FEV1-PRE: 2.99 L
FEV1-Post: 3.14 L
FEV1FVC-%CHANGE-POST: 5 %
FEV1FVC-%Pred-Pre: 101 %
FEV6-%Change-Post: 0 %
FEV6-%PRED-PRE: 90 %
FEV6-%Pred-Post: 90 %
FEV6-PRE: 3.88 L
FEV6-Post: 3.85 L
FEV6FVC-%Change-Post: 0 %
FEV6FVC-%PRED-POST: 104 %
FEV6FVC-%PRED-PRE: 103 %
FVC-%CHANGE-POST: 0 %
FVC-%PRED-PRE: 87 %
FVC-%Pred-Post: 86 %
FVC-POST: 3.87 L
FVC-PRE: 3.89 L
POST FEV6/FVC RATIO: 100 %
PRE FEV1/FVC RATIO: 77 %
PRE FEV6/FVC RATIO: 100 %
Post FEV1/FVC ratio: 81 %
RV % pred: 122 %
RV: 2.58 L
TLC % PRED: 96 %
TLC: 6.39 L

## 2018-02-13 LAB — COMPREHENSIVE METABOLIC PANEL
ALBUMIN: 3.9 g/dL (ref 3.5–5.0)
ALK PHOS: 56 U/L (ref 38–126)
ALT: 19 U/L (ref 0–44)
AST: 20 U/L (ref 15–41)
Anion gap: 10 (ref 5–15)
BILIRUBIN TOTAL: 0.8 mg/dL (ref 0.3–1.2)
BUN: 11 mg/dL (ref 6–20)
CALCIUM: 9 mg/dL (ref 8.9–10.3)
CO2: 20 mmol/L — AB (ref 22–32)
CREATININE: 0.76 mg/dL (ref 0.61–1.24)
Chloride: 106 mmol/L (ref 98–111)
GFR calc Af Amer: >60 mL/min (ref >60)
GFR calc non Af Amer: >60 mL/min (ref >60)
GLUCOSE: 91 mg/dL (ref 70–99)
Potassium: 4 mmol/L (ref 3.5–5.1)
SODIUM: 136 mmol/L (ref 135–145)
TOTAL PROTEIN: 6.6 g/dL (ref 6.5–8.1)

## 2018-02-13 LAB — URINALYSIS, ROUTINE W REFLEX MICROSCOPIC
BILIRUBIN URINE: NEGATIVE
Glucose, UA: NEGATIVE mg/dL
Hgb urine dipstick: NEGATIVE
Ketones, ur: NEGATIVE mg/dL
Leukocytes, UA: NEGATIVE
NITRITE: NEGATIVE
PROTEIN: NEGATIVE mg/dL
Specific Gravity, Urine: 1.014 (ref 1.005–1.030)
pH: 6 (ref 5.0–8.0)

## 2018-02-13 LAB — APTT: aPTT: 24 seconds (ref 24–36)

## 2018-02-13 LAB — BLOOD GAS, ARTERIAL
Acid-Base Excess: 0.1 mmol/L (ref 0.0–2.0)
Bicarbonate: 24 mmol/L (ref 20.0–28.0)
Drawn by: 265211
FIO2: 21
O2 SAT: 98.9 %
PATIENT TEMPERATURE: 98.6
PCO2 ART: 37.1 mmHg (ref 32.0–48.0)
PO2 ART: 126 mmHg — AB (ref 83.0–108.0)
pH, Arterial: 7.426 (ref 7.350–7.450)

## 2018-02-13 LAB — ABO/RH: ABO/RH(D): O NEG

## 2018-02-13 LAB — HEMOGLOBIN A1C
Hgb A1c MFr Bld: 5.2 % (ref 4.8–5.6)
Mean Plasma Glucose: 102.54 mg/dL

## 2018-02-13 LAB — PROTIME-INR
INR: 0.97
Prothrombin Time: 12.8 seconds (ref 11.4–15.2)

## 2018-02-13 LAB — SURGICAL PCR SCREEN
MRSA, PCR: NEGATIVE
Staphylococcus aureus: NEGATIVE

## 2018-02-13 MED ORDER — ALBUTEROL SULFATE (2.5 MG/3ML) 0.083% IN NEBU
2.5000 mg | INHALATION_SOLUTION | Freq: Once | RESPIRATORY_TRACT | Status: AC
  Administered 2018-02-13: 2.5 mg via RESPIRATORY_TRACT

## 2018-02-13 NOTE — Progress Notes (Signed)
   02/13/18 1150  OBSTRUCTIVE SLEEP APNEA  Have you ever been diagnosed with sleep apnea through a sleep study? No  Do you snore loudly (loud enough to be heard through closed doors)?  1  Do you often feel tired, fatigued, or sleepy during the daytime (such as falling asleep during driving or talking to someone)? 0  Has anyone observed you stop breathing during your sleep? 0  Do you have, or are you being treated for high blood pressure? 1  BMI more than 35 kg/m2? 1  Age > 50 (1-yes) 1  Neck circumference greater than:Male 16 inches or larger, Male 17inches or larger? 1  Male Gender (Yes=1) 1  Obstructive Sleep Apnea Score 6

## 2018-02-13 NOTE — Progress Notes (Signed)
Pt accompanied by daughter in-law to PAT visit. Pt denies any acute cardiopulmonary issues.  Pt under the care of Dr. Tomie Chinaevankar, Cardiology. Pt denies having a stress test. Pt stated that last dose of Plavix was Sunday as instructed. Pt chart forwarded to anesthesia for review.

## 2018-02-13 NOTE — Progress Notes (Signed)
Left voice message with Alycia Rossettiyan, RN, to make MD aware that pt C/O difficulty hearing (not his baseline). Pt stated " it may be wax." Daughter in-law agreed that left side of pt face appeared slightly swollen. Pt denies pain, ear drainage and is afebrile (97.7 oral temp ). Pt made aware to notify surgeon if symptoms become worse such a fever, pain, ear drainage or increased swelling. Pt verbalized understanding of all pre-op instructions.

## 2018-02-13 NOTE — Pre-Procedure Instructions (Signed)
Tyler Kemp  02/13/2018     [id number] - [place], [place] - [address] AT [facility] [address] [place] [address] Phone: [phone] Fax: [phone]   Your procedure is scheduled on Monday, February 18, 2018  Report to [facility] Admitting at 5:30 A.M.  Call this number if you have problems the morning of surgery:  [phone]   Remember: Follow Physician's instruction regarding Plavix  Do not eat or drink after midnight Sunday, February 17, 2018  Take these medicines the morning of surgery with A SIP OF WATER  metoprolol tartrate (LOPRESSOR), pantoprazole (PROTONIX)  Stop taking vitamins, fish oil and herbal medications. Do not take any NSAIDs ie: Ibuprofen, Advil, Naproxen (Aleve), Motrin, BC and Goody Powder; stop now.  Do not wear jewelry  Do not wear lotions, powders, or cologne, or deodorant.  Men may shave face and neck.  Do not bring valuables to the hospital.  [facility] is not responsible for any belongings or valuables.  Contacts, dentures or bridgework may not be worn into surgery.  Leave your suitcase in the car.  After surgery it may be brought to your room   Special instructions:   [place]- Preparing For Surgery  Before surgery, you can play an important role. Because skin is not sterile, your skin needs to be as free of germs as possible. You can reduce the number of germs on your skin by washing with CHG (chlorahexidine gluconate) Soap before surgery.  CHG is an antiseptic cleaner which kills germs and bonds with the skin to continue killing germs even after washing.    Oral Hygiene is also important to reduce your risk of infection.  Remember - BRUSH YOUR TEETH THE MORNING OF SURGERY WITH YOUR REGULAR TOOTHPASTE  Please do not use if you have an allergy to CHG or antibacterial soaps. If your skin becomes reddened/irritated stop using the CHG.  Do not shave (including legs and underarms) for at least  48 hours prior to first CHG shower. It is OK to shave your face.  Please follow these instructions carefully.   1. Shower the NIGHT BEFORE SURGERY and the MORNING OF SURGERY with CHG.   2. If you chose to wash your hair, wash your hair first as usual with your normal shampoo.  3. After you shampoo, rinse your hair and body thoroughly to remove the shampoo.  4. Use CHG as you would any other liquid soap. You can apply CHG directly to the skin and wash gently with a scrungie or a clean washcloth.   5. Apply the CHG Soap to your body ONLY FROM THE NECK DOWN.  Do not use on open wounds or open sores. Avoid contact with your eyes, ears, mouth and genitals (private parts). Wash Face and genitals (private parts)  with your normal soap.  6. Wash thoroughly, paying special attention to the area where your surgery will be performed.  7. Thoroughly rinse your body with warm water from the neck down.  8. DO NOT shower/wash with your normal soap after using and rinsing off the CHG Soap.  9. Pat yourself dry with a CLEAN TOWEL.  10. Wear CLEAN PAJAMAS to bed the night before surgery, wear comfortable clothes the morning of surgery  11. Place CLEAN SHEETS on your bed the night of your first shower and DO NOT SLEEP WITH PETS.    Day of Surgery:  Do  not apply any deodorants/lotions.  Please wear clean clothes to the hospital/surgery center.   Remember to brush your teeth WITH YOUR REGULAR TOOTHPASTE.    Please read over the following fact sheets that you were given.

## 2018-02-15 MED ORDER — SODIUM CHLORIDE 0.9 % IV SOLN
INTRAVENOUS | Status: DC
  Filled 2018-02-15: qty 1

## 2018-02-15 MED ORDER — DOPAMINE-DEXTROSE 3.2-5 MG/ML-% IV SOLN
0.0000 ug/kg/min | INTRAVENOUS | Status: DC
  Filled 2018-02-15: qty 250

## 2018-02-15 MED ORDER — SODIUM CHLORIDE 0.9 % IV SOLN
INTRAVENOUS | Status: DC
  Filled 2018-02-15: qty 30

## 2018-02-15 MED ORDER — PLASMA-LYTE 148 IV SOLN
INTRAVENOUS | Status: DC
  Filled 2018-02-15: qty 2.5

## 2018-02-15 MED ORDER — SODIUM CHLORIDE 0.9 % IV SOLN
1.5000 g | INTRAVENOUS | Status: DC
  Filled 2018-02-15: qty 1.5

## 2018-02-15 MED ORDER — TRANEXAMIC ACID (OHS) PUMP PRIME SOLUTION
2.0000 mg/kg | INTRAVENOUS | Status: DC
  Filled 2018-02-15: qty 2.07

## 2018-02-15 MED ORDER — KENNESTONE BLOOD CARDIOPLEGIA VIAL
13.0000 mL | Freq: Once | Status: DC
  Filled 2018-02-15: qty 13

## 2018-02-15 MED ORDER — MILRINONE LACTATE IN DEXTROSE 20-5 MG/100ML-% IV SOLN
0.1250 ug/kg/min | INTRAVENOUS | Status: DC
  Filled 2018-02-15: qty 100

## 2018-02-15 MED ORDER — SODIUM CHLORIDE 0.9 % IV SOLN
750.0000 mg | INTRAVENOUS | Status: DC
  Filled 2018-02-15: qty 750

## 2018-02-15 MED ORDER — MAGNESIUM SULFATE 50 % IJ SOLN
40.0000 meq | INTRAMUSCULAR | Status: DC
  Filled 2018-02-15: qty 9.85

## 2018-02-15 MED ORDER — POTASSIUM CHLORIDE 2 MEQ/ML IV SOLN
80.0000 meq | INTRAVENOUS | Status: DC
  Filled 2018-02-15: qty 40

## 2018-02-15 MED ORDER — TRANEXAMIC ACID 1000 MG/10ML IV SOLN
1.5000 mg/kg/h | INTRAVENOUS | Status: DC
  Filled 2018-02-15: qty 25

## 2018-02-15 MED ORDER — NITROGLYCERIN IN D5W 200-5 MCG/ML-% IV SOLN
2.0000 ug/min | INTRAVENOUS | Status: DC
  Filled 2018-02-15: qty 250

## 2018-02-15 MED ORDER — KENNESTONE BLOOD CARDIOPLEGIA (KBC) MANNITOL SYRINGE (20%, 32ML)
32.0000 mL | Freq: Once | INTRAVENOUS | Status: DC
  Filled 2018-02-15: qty 32

## 2018-02-15 MED ORDER — DEXMEDETOMIDINE HCL IN NACL 400 MCG/100ML IV SOLN
0.1000 ug/kg/h | INTRAVENOUS | Status: DC
  Filled 2018-02-15: qty 100

## 2018-02-15 MED ORDER — EPINEPHRINE PF 1 MG/ML IJ SOLN
0.0000 ug/min | INTRAVENOUS | Status: DC
  Filled 2018-02-15: qty 4

## 2018-02-15 MED ORDER — TRANEXAMIC ACID (OHS) BOLUS VIA INFUSION
15.0000 mg/kg | INTRAVENOUS | Status: DC
  Filled 2018-02-15: qty 1551

## 2018-02-15 MED ORDER — VANCOMYCIN HCL 10 G IV SOLR
1500.0000 mg | INTRAVENOUS | Status: AC
  Administered 2018-02-18: 1500 mg via INTRAVENOUS
  Filled 2018-02-15: qty 1500

## 2018-02-15 MED ORDER — PHENYLEPHRINE HCL 10 MG/ML IJ SOLN
30.0000 ug/min | INTRAMUSCULAR | Status: DC
  Filled 2018-02-15: qty 2

## 2018-02-17 NOTE — H&P (Signed)
[address]       [place] [address]             [phone]      Cardiothoracic Surgery Admission History and Physical   Referring Provider is Lyn Records, MD  PCP is Practice, [facility]      Chief Complaint  Patient presents with  . Aortic Stenosis      . Coronary Artery Disease  . Thoracic Aortic Aneurysm   HPI:  The patient is a 57 year old gentleman with a history of coronary artery disease status post PCI and stenting in the past at [facility], aortic stenosis, and ascending aortic aneurysm who has been followed in the past by Dr. Tomie China. He had a recent follow-up echocardiogram on 11/22/2017 which showed progression of aortic stenosis to severe with a mean gradient of 51 mmHg and a peak gradient of 80 mmHg. The dimensionless index was 0.19. Left ventricular ejection fraction was 55 to 60%. His aortic root was noted to be dilated at 46 mm. Cardiac catheterization on 12/04/2017 showed severe diffuse coronary disease with 50% ostial and 40% distal left main stenoses. The LAD had an eccentric 75% stenosis distal to the previously placed proximal to mid LAD stent. There is high-grade obstruction in the second marginal branch. There is a high-grade stenosis in the second posterior lateral branch of the right coronary. Right heart pressures were normal. He underwent a CT angiogram of the chest which showed the aortic root and ascending aorta to be 4.7 cm.  The patient is here with his daughter-in-law today. He is a somewhat difficult historian but reports some shortness of breath and chest tightness with exertion. He has started to have some dizzy spells. He continues to work doing Youth worker. He is a widower and lives alone in [place]. He has 2 children both of whom work full-time. He reports quitting smoking about 4 months ago. He reports drinking alcohol every weekend which may include a pint or more of liquor.   He had an episode of  esophageal food impaction on 12/11/2017 which occurred after eating barbecue. He waited several days before seeking medical attention. He underwent upper endoscopy by Dr. Leone Payor and was found to have some food in the distal esophagus which was removed. There was a mild stricture in the distal esophagus and a hiatal hernia.  The patient reports that when he was evaluated in the past at [facility] he was told that he would likely require aortic valve replacement in the future and he was noted to have bad teeth at that time and told that he needed to have multiple teeth extracted. He had this done and only has a few remaining teeth in the lower jaw.       Past Medical History:  Diagnosis Date  . Coronary artery disease   . Food impaction of esophagus 12/11/2017  . GERD (gastroesophageal reflux disease)   . Hiatal hernia   . Severe aortic stenosis   . Thoracic aortic aneurysm [facility])         Past Surgical History:  Procedure Laterality Date  . CHOLECYSTECTOMY    . ESOPHAGOGASTRODUODENOSCOPY (EGD) WITH PROPOFOL N/A 12/11/2017   Procedure: ESOPHAGOGASTRODUODENOSCOPY (EGD) WITH PROPOFOL; Surgeon: Iva Boop, MD; Location: [facility] ENDOSCOPY; Service: Endoscopy; Laterality: N/A;  . FOREIGN BODY REMOVAL N/A 12/11/2017   Procedure: FOREIGN BODY REMOVAL; Surgeon: Iva Boop, MD; Location: [facility] ENDOSCOPY; Service: Endoscopy; Laterality: N/A;  .  RIGHT/LEFT HEART CATH AND CORONARY ANGIOGRAPHY N/A 12/04/2017   Procedure: RIGHT/LEFT HEART CATH AND CORONARY ANGIOGRAPHY; Surgeon: Lyn Records, MD; Location: [facility] INVASIVE CV LAB; Service: Cardiovascular; Laterality: N/A;   History reviewed. No pertinent family history.  Social History        Socioeconomic History  . Marital status: Single    Spouse name: Not on file  . Number of children: Not on file  . Years of education: Not on file  . Highest education level: Not on file  Occupational History  . Not on file  Social Needs  . Financial  resource strain: Not on file  . Food insecurity:    Worry: Not on file    Inability: Not on file  . Transportation needs:    Medical: Not on file    Non-medical: Not on file  Tobacco Use  . Smoking status: Current Some Day Smoker  . Smokeless tobacco: Current User  Substance and Sexual Activity  . Alcohol use: Yes    Comment: occasional  . Drug use: Never  . Sexual activity: Not on file  Lifestyle  . Physical activity:    Days per week: Not on file    Minutes per session: Not on file  . Stress: Not on file  Relationships  . Social connections:    Talks on phone: Not on file    Gets together: Not on file    Attends religious service: Not on file    Active member of club or organization: Not on file    Attends meetings of clubs or organizations: Not on file    Relationship status: Not on file  . Intimate partner violence:    Fear of current or ex partner: Not on file    Emotionally abused: Not on file    Physically abused: Not on file    Forced sexual activity: Not on file  Other Topics Concern  . Not on file  Social History Narrative  . Not on file         Current Outpatient Medications  Medication Sig Dispense Refill  . aspirin 81 MG chewable tablet Chew 81 mg by mouth daily.   3  . atorvastatin (LIPITOR) 10 MG tablet Take 1 tablet (10 mg total) by mouth daily at 6 PM. 90 tablet 2  . clopidogrel (PLAVIX) 75 MG tablet Take 1 tablet (75 mg total) by mouth daily. 30 tablet 0  . metoprolol tartrate (LOPRESSOR) 25 MG tablet Take 1 tablet (25 mg total) by mouth 2 (two) times daily. 180 tablet 1  . pantoprazole (PROTONIX) 40 MG tablet Take 1 tablet (40 mg total) by mouth daily. 90 tablet 3   No current facility-administered medications for this visit.    No Known Allergies   Review of Systems:   General: normal appetite, normal energy, no weight gain, no weight loss, no fever  Cardiac: + chest pain with exertion, no chest pain at rest, +SOB with exertion, no resting  SOB, no PND, no orthopnea, no palpitations, no arrhythmia, no atrial fibrillation, + LE edema, + dizzy spells, no syncope  Respiratory: exertional shortness of breath, no home oxygen, no productive cough, no dry cough, no bronchitis, no wheezing, no hemoptysis, no asthma, no pain with inspiration or cough, no sleep apnea, no CPAP at night  GI: some difficulty swallowing, + reflux, no frequent heartburn, + hiatal hernia, no abdominal pain, + constipation, no diarrhea, no hematochezia, no hematemesis, no melena  GU: no dysuria, + frequency, no urinary  tract infection, no hematuria, no enlarged prostate, no kidney stones, no kidney disease  Vascular: no pain suggestive of claudication, no pain in feet, no leg cramps, + varicose veins, no DVT, hx of non-healing right leg ulcers  Neuro: no stroke, no TIA's, no seizures, no headaches, no temporary blindness one eye, no slurred speech, no peripheral neuropathy, no chronic pain, no instability of gait, no memory/cognitive dysfunction  Musculoskeletal: no arthritis, no joint swelling, + myalgias, no difficulty walking, no mobility  Skin: no rash, no itching, no skin infections, no pressure sores or ulcerations  Psych: no anxiety, no depression, no nervousness, no unusual recent stress  Eyes: + blurry vision, no floaters, no recent vision changes, does not wear glasses or contacts  ENT: no hearing loss, no loose or painful teeth, no dentures, last saw dentist end of 2018  Hematologic: + easy bruising, no abnormal bleeding, no clotting disorder, no frequent epistaxis  Endocrine: no diabetes, does not check CBG's at home   Physical Exam:   BP 126/76  Pulse 87  Resp 20  Ht 5\' 10"  (1.778 m)  Wt 230 lb (104.3 kg)  SpO2 98% Comment: RA  BMI 33.00 kg/m  General: Well-appearing  HEENT: Unremarkable, NCAT, PERLA, EOMI, oropharynx clear. Few remaining teeth in mandible anteriorly that are in fair condition.  Neck: no JVD, no bruits, no adenopathy or  thyromegaly  Chest: clear to auscultation, symmetrical breath sounds, no wheezes, no rhonchi  CV: RRR, grade III/VI crescendo/decrescendo murmur heard best at RSB, no diastolic murmur  Abdomen: soft, non-tender, no masses or organomegaly  Extremities: warm, well-perfused, pulses palpable in feet, moderate RLE edema and large varicose veins. Trace edema in LLE and no significant varicose veins.  Rectal/GU Deferred  Neuro: Grossly non-focal and symmetrical throughout  Skin: Clean and dry, no rashes, no breakdown    Diagnostic Tests:   Result status: Final result  [facility]*  [address]  [place], [place] [address]  [phone]  -------------------------------------------------------------------  Transthoracic Echocardiography  Patient: Tyler Kemp, Tyler Kemp  MR #: [id number]  Study Date: 11/22/2017  Gender: M  Age: 35  Height: 177.8 cm  Weight: 105.2 kg  BSA: 2.31 m^2  Pt. Status:  Room:  ATTENDING Belva Crome, MD  ORDERING Belva Crome, MD  REFERRING Belva Crome, MD  PERFORMING [facility]  SONOGRAPHER Jimmy Reel, RDCS  cc:  -------------------------------------------------------------------  LV EF: 55% - 60%  -------------------------------------------------------------------  Indications: Aortic stenosis 424.1.  -------------------------------------------------------------------  History: PMH: Coronary artery disease. Risk factors: Current  tobacco use.  -------------------------------------------------------------------  Study Conclusions  - Left ventricle: The cavity size was normal. Wall thickness was  increased in a pattern of mild LVH. Systolic function was normal.  The estimated ejection fraction was in the range of 55% to 60%.  Wall motion was normal; there were no regional wall motion  abnormalities. Features are consistent with a pseudonormal left  ventricular filling pattern, with concomitant abnormal relaxation  and  increased filling pressure (grade 2 diastolic dysfunction).  - Aortic valve: There was trivial regurgitation. Valve area (VTI):  0.82 cm^2. Valve area (Vmax): 0.8 cm^2. Valve area (Vmean): 0.78  cm^2.  - Mitral valve: Calcified annulus. Valve area by pressure  half-time: 2.24 cm^2. Valve area by continuity equation (using  LVOT flow): 3.22 cm^2.  - Left atrium: The atrium was moderately dilated.  Impressions:  - Normal LVEF.  Mild LVH.  Aortic root aneurysm - 46 mm  Severe AS = mean 51 mmHg,  AVA 0.8 cm2. Pseudonormal transmitral  pattern.  Mild MR.  -------------------------------------------------------------------  Study data: No prior study was available for comparison. Study  status: Routine. Procedure: The patient reported no pain pre or  post test. Transthoracic echocardiography. Image quality was  adequate. Transthoracic echocardiography. M-mode,  complete 2D, spectral Doppler, and color Doppler. Birthdate:  Patient birthdate: 06/03/1961. Age: Patient is 57 yr old. Sex:  Gender: male. BMI: 33.3 kg/m^2. Blood pressure: 124/82  Patient status: Outpatient. Study date: Study date: 11/22/2017.  Study time: 02:48 PM. Location: Echo laboratory.  -------------------------------------------------------------------  -------------------------------------------------------------------  Left ventricle: The cavity size was normal. Wall thickness was  increased in a pattern of mild LVH. Systolic function was normal.  The estimated ejection fraction was in the range of 55% to 60%.  Wall motion was normal; there were no regional wall motion  abnormalities. Features are consistent with a pseudonormal left  ventricular filling pattern, with concomitant abnormal relaxation  and increased filling pressure (grade 2 diastolic dysfunction).  -------------------------------------------------------------------  Aortic valve: Trileaflet; normal thickness leaflets. Mobility was  not restricted.  Doppler: Transvalvular velocity was within the  normal range. There was no stenosis. There was trivial  regurgitation. VTI ratio of LVOT to aortic valve: 0.2. Valve  area (VTI): 0.82 cm^2. Indexed valve area (VTI): 0.35 cm^2/m^2.  Peak velocity ratio of LVOT to aortic valve: 0.19. Valve area  (Vmax): 0.8 cm^2. Indexed valve area (Vmax): 0.35 cm^2/m^2. Mean  velocity ratio of LVOT to aortic valve: 0.19. Valve area (Vmean):  0.78 cm^2. Indexed valve area (Vmean): 0.34 cm^2/m^2. Mean  gradient (S): 51 mm Hg. Peak gradient (S): 80 mm Hg.  -------------------------------------------------------------------  Aorta: There was an aneurysm. Aortic root: The aortic root was  moderately dilated.  -------------------------------------------------------------------  Mitral valve: Calcified annulus. Mobility was not restricted.  Doppler: Transvalvular velocity was within the normal range. There  was no evidence for stenosis. There was trivial regurgitation.  Valve area by pressure half-time: 2.24 cm^2. Indexed valve area by  pressure half-time: 0.97 cm^2/m^2. Valve area by continuity  equation (using LVOT flow): 3.22 cm^2. Indexed valve area by  continuity equation (using LVOT flow): 1.39 cm^2/m^2. Mean  gradient (D): 2 mm Hg. Peak gradient (D): 4 mm Hg.  -------------------------------------------------------------------  Left atrium: The atrium was moderately dilated.  -------------------------------------------------------------------  Right ventricle: The cavity size was normal. Wall thickness was  normal. Systolic function was normal.  -------------------------------------------------------------------  Pulmonic valve: Doppler: Transvalvular velocity was within the  normal range. There was no evidence for stenosis.  -------------------------------------------------------------------  Tricuspid valve: Structurally normal valve. Doppler:  Transvalvular velocity was within the normal range. There  was no  regurgitation.  -------------------------------------------------------------------  Pulmonary artery: The main pulmonary artery was normal-sized.  Systolic pressure was within the normal range.  -------------------------------------------------------------------  Right atrium: The atrium was normal in size.  -------------------------------------------------------------------  Pericardium: There was no pericardial effusion.  -------------------------------------------------------------------  Systemic veins:  Inferior vena cava: The vessel was normal in size.  -------------------------------------------------------------------  Measurements  Left ventricle Value Reference  LV ID, ED, PLAX chordal 44.9 mm 43 - 52  LV ID, ES, PLAX chordal 29.3 mm 23 - 38  LV fx shortening, PLAX chordal 35 % >=29  LV PW thickness, ED 18.5 mm ---------  IVS/LV PW ratio, ED 0.94 <=1.3  Stroke volume, 2D 98 ml ---------  Stroke volume/bsa, 2D 42 ml/m^2 ---------  LV e&', lateral 7.62 cm/s ---------  LV E/e&', lateral 13.52 ---------  LV e&', medial 6.53 cm/s ---------  LV E/e&', medial  15.77 ---------  LV e&', average 7.08 cm/s ---------  LV E/e&', average 14.56 ---------  Longitudinal strain, TDI 14 % ---------  Ventricular septum Value Reference  IVS thickness, ED 17.3 mm ---------  LVOT Value Reference  LVOT ID, S 23 mm ---------  LVOT area 4.15 cm^2 ---------  LVOT peak velocity, S 85.7 cm/s ---------  LVOT mean velocity, S 62.3 cm/s ---------  LVOT VTI, S 23.6 cm ---------  Aortic valve Value Reference  Aortic valve peak velocity, S 447 cm/s ---------  Aortic valve mean velocity, S 332 cm/s ---------  Aortic valve VTI, S 119 cm ---------  Aortic mean gradient, S 51 mm Hg ---------  Aortic peak gradient, S 80 mm Hg ---------  VTI ratio, LVOT/AV 0.2 ---------  Aortic valve area, VTI 0.82 cm^2 ---------  Aortic valve area/bsa, VTI 0.35 cm^2/m^2 ---------  Velocity ratio, peak,  LVOT/AV 0.19 ---------  Aortic valve area, peak velocity 0.8 cm^2 ---------  Aortic valve area/bsa, peak 0.35 cm^2/m^2 ---------  velocity  Velocity ratio, mean, LVOT/AV 0.19 ---------  Aortic valve area, mean velocity 0.78 cm^2 ---------  Aortic valve area/bsa, mean 0.34 cm^2/m^2 ---------  velocity  Aorta Value Reference  Aortic root ID, ED 43 mm ---------  Ascending aorta ID, A-P, S 46 mm ---------  Left atrium Value Reference  LA ID, A-P, ES 41 mm ---------  LA ID/bsa, A-P 1.77 cm/m^2 <=2.2  LA volume, S 102 ml ---------  LA volume/bsa, S 44.1 ml/m^2 ---------  LA volume, ES, 1-p A4C 74.1 ml ---------  LA volume/bsa, ES, 1-p A4C 32 ml/m^2 ---------  LA volume, ES, 1-p A2C 135 ml ---------  LA volume/bsa, ES, 1-p A2C 58.3 ml/m^2 ---------  Mitral valve Value Reference  Mitral E-wave peak velocity 103 cm/s ---------  Mitral A-wave peak velocity 103 cm/s ---------  Mitral mean velocity, D 64.1 cm/s ---------  Mitral deceleration time (H) 264 ms 150 - 230  Mitral pressure half-time 98 ms ---------  Mitral mean gradient, D 2 mm Hg ---------  Mitral peak gradient, D 4 mm Hg ---------  Mitral E/A ratio, peak 1 ---------  Mitral valve area, PHT, DP 2.24 cm^2 ---------  Mitral valve area/bsa, PHT, DP 0.97 cm^2/m^2 ---------  Mitral valve area, LVOT continuity 3.22 cm^2 ---------  Mitral valve area/bsa, LVOT 1.39 cm^2/m^2 ---------  continuity  Mitral annulus VTI, D 30.4 cm ---------  Systemic veins Value Reference  Estimated CVP 3 mm Hg ---------  Right ventricle Value Reference  TAPSE 21.4 mm ---------  RV s&', lateral, S 6.53 cm/s ---------  Legend:  (L) and (H) mark values outside specified reference range.  -------------------------------------------------------------------  Prepared and Electronically Authenticated by  Gypsy Balsam, MD  2019-04-11T17:01:59     Physicians  Panel Physicians Referring Physician Case Authorizing Physician  Lyn Records, MD  (Primary)    Procedures  RIGHT/LEFT HEART CATH AND CORONARY ANGIOGRAPHY  Conclusion  Technically difficult procedure performed from the right radial and antecubital sites for left and right heart catheterization.  Severe aortic stenosis previously documented by echocardiography. The valve was crossed twice using an Amplatz and 0.035 straight wire, however after removal of the wire the catheter was propelled back into the ascending aorta due to hydraulic forces. Therefore, unable to measure LVEDP or gradient.  Normal right heart pressures with mean capillary wedge 8 mmHg.  Severe diffuse coronary disease with 50% ostial and 40% distal left main, eccentric 75% stenosis distal to the previously placed proximal to mid LAD stent, high-grade obstruction in the second  obtuse marginal, and high-grade obstruction in the second left ventricular branch.  Recent CT scan demonstrates ascending aortic aneurysm measuring 4.7 cm. RECOMMENDATIONS:  Referred to TCTS for aortic valve replacement, aortic root replacement, and coronary bypass grafting.  Indications  Aortic stenosis, severe [I35.0 (ICD-10-CM)]  CAD in native artery [I25.10 (ICD-10-CM)]  Procedural Details/Technique  Technical Details The right radial area was sterilely prepped and draped. Intravenous sedation with Versed and fentanyl was administered. 1% Xylocaine was infiltrated to achieve local analgesia. Using real-time vascular ultrasound, a double wall stick with an angiocath was utilized to obtain intra-arterial access. A VUS image was saved for the permanent record.The modified Seldinger technique was used to place a 774F " Slender" sheath in the right radial artery. Weight based heparin was administered. Coronary angiography was done using 5 F catheters. Right coronary angiography was performed with a 74F JR4 guide catheter. Multiple other 5 French catheters were used before upgrading to 6 [place] without success. Right coronary was difficult to  intubate related to angulation in the innominate aorta bifurcation and aortic stenosis jet which tended to displace the catheter due to hydraulic forces. Left ventricular hemodymic recordings and angiography was not done because on 2 occasions after crossing the valve, the hydraulic jet would propel the Amplatz catheter back into the ascending aorta. Left coronary angiography was not.  Right heart catheterization was performed by exchanging a previously placed antecubital IV angio-cath for a 5 French Slender sheath. 1% Xylocaine was used to locally nesthetize the area around the IV site. The IV catheter was wired using an .018 guidewire. The modified Seldinger technique was used to place the 5 [place] sheath. Double glove technique was used to enhance sterility. After sheath insertion, right heart cath was performed using a 5 French balloon tipped catheter and fluoroscopic guidance. Pressures were recorded in each chamber and in the pulmonary capillary wedge position.. The main pulmonary artery O2 saturation was sampled.   Hemostasis was achieved using a pneumatic band.  During this procedure the patient is administered a total of Versed 1 mg and Fentanyl 75 mg to achieve and maintain moderate conscious sedation. The patient's heart rate, blood pressure, and oxygen saturation are monitored continuously during the procedure. The period of conscious sedation is 77 minutes, of which I was present face-to-face 100% of this time.   Estimated blood loss <50 mL.  During this procedure the patient was administered the following to achieve and maintain moderate conscious sedation: Versed 1 mg, Fentanyl 75 mcg, while the patient's heart rate, blood pressure, and oxygen saturation were continuously monitored. The period of conscious sedation was 77 minutes, of which I was present face-to-face 100% of this time.  Coronary Findings  Diagnostic  Dominance: Right  Left Main  Ost LM lesion 50% stenosed  Ost LM lesion  is 50% stenosed.  Ost LM to Dist LM lesion 40% stenosed  Ost LM to Dist LM lesion is 40% stenosed.  Left Anterior Descending  Ost LAD to Prox LAD lesion 10% stenosed  Ost LAD to Prox LAD lesion is 10% stenosed. The lesion was previously treated.  Prox LAD lesion 75% stenosed  Prox LAD lesion is 75% stenosed.  Left Circumflex  Ost Cx lesion 60% stenosed  Ost Cx lesion is 60% stenosed.  First Obtuse Marginal Branch  Ost 1st Mrg to 1st Mrg lesion 60% stenosed  Ost 1st Mrg to 1st Mrg lesion is 60% stenosed.  Second Obtuse Marginal Branch  Ost 2nd Mrg lesion 40% stenosed  Ost 2nd  Mrg lesion is 40% stenosed.  Third Obtuse Marginal Branch  Ost 3rd Mrg to 3rd Mrg lesion 85% stenosed  Ost 3rd Mrg to 3rd Mrg lesion is 85% stenosed.  Right Coronary Artery  Mid RCA to Dist RCA lesion 45% stenosed  Mid RCA to Dist RCA lesion is 45% stenosed.  First Right Posterolateral  Vessel is small in size.  Second Right Posterolateral  2nd RPLB lesion 95% stenosed  2nd RPLB lesion is 95% stenosed.  Intervention  No interventions have been documented.  Right Heart  Right Heart Pressures Hemodynamic findings consistent with pulmonary hypertension.  Coronary Diagrams  Diagnostic Diagram     Implants     No implant documentation for this case.  MERGE Images  Link to Procedure Log   Show images for CARDIAC CATHETERIZATION Procedure Log  Hemo Data   Most Recent Value  Fick Cardiac Output 6.15 L/min  Fick Cardiac Output Index 2.81 (L/min)/BSA  RA A Wave 7 mmHg  RA V Wave 7 mmHg  RA Mean 4 mmHg  RV Systolic Pressure 27 mmHg  RV Diastolic Pressure 0 mmHg  RV EDP 5 mmHg  PA Systolic Pressure 28 mmHg  PA Diastolic Pressure 8 mmHg  PA Mean 16 mmHg  PW A Wave 11 mmHg  PW V Wave 10 mmHg  PW Mean 8 mmHg  AO Systolic Pressure 130 mmHg  AO Diastolic Pressure 73 mmHg  AO Mean 97 mmHg  QP/QS 1  TPVR Index 5.7 HRUI  TSVR Index 34.55 HRUI  PVR SVR Ratio 0.09  TPVR/TSVR Ratio 0.16    CLINICAL  DATA: Follow-up thoracic aortic aneurysm  EXAM:  CT ANGIOGRAPHY CHEST WITH CONTRAST  TECHNIQUE:  Multidetector CT imaging of the chest was performed using the  standard protocol during bolus administration of intravenous  contrast. Multiplanar CT image reconstructions and MIPs were  obtained to evaluate the vascular anatomy.  CONTRAST: ISOVUE-370 IOPAMIDOL (ISOVUE-370) INJECTION 76%  COMPARISON: None.  FINDINGS:  Cardiovascular: Ascending thoracic aorta is aneurysmal measuring up  to 4.7 cm. Diffuse aortic valve calcifications. Moderate to severe  coronary artery calcifications within the left anterior descending,  left circumflex and right coronary arteries. Left main coronary  artery calcifications also noted. No evidence of aortic dissection.  Mediastinum/Nodes: Small hiatal hernia. No mediastinal, hilar, or  axillary adenopathy.  Lungs/Pleura: Lungs are clear. No focal airspace opacities or  suspicious nodules. No effusions.  Upper Abdomen: Prior cholecystectomy. No acute findings.  Musculoskeletal: Chest wall soft tissues are unremarkable. No acute  bony abnormality. Degenerative changes diffusely throughout the  thoracic spine.  Review of the MIP images confirms the above findings.  IMPRESSION:  4.7 cm ascending thoracic aortic aneurysm. Recommend semi-annual  imaging followup by CTA or MRA and referral to cardiothoracic  surgery if not already obtained. This recommendation follows 2010  ACCF/AHA/AATS/ACR/ASA/SCA/SCAI/SIR/STS/SVM Guidelines for the  Diagnosis and Management of Patients With Thoracic Aortic Disease.  Circulation. 2010; 121: Z610-R604  Small hiatal hernia.  Extensive coronary artery disease. Aortic valve calcifications  diffusely. Aortic atherosclerosis.  Electronically Signed  By: Charlett Nose M.D.  On: 11/23/2017 08:00     Impression:   This 57 year old gentleman has stage D, severe, symptomatic aortic stenosis with New York Heart Association  class II symptoms of exertional shortness of breath consistent with chronic diastolic congestive heart failure. He also has exertional chest discomfort and is starting to have some episodes of dizziness. I have personally reviewed his echocardiogram, cardiac catheterization, and CTA of the chest. His echocardiogram shows  a trileaflet aortic valve with severe calcification and a mean gradient of 51 mmHg consistent with severe aortic stenosis. His cardiac catheterization shows moderate proximal and distal LAD stenosis as well as high-grade stenoses in the LAD, obtuse marginal, and posterior lateral branch of the right coronary artery. The CTA of the chest shows dilation of the aortic root and ascending aorta to 4.7 cm. The aorta does not return to a more normal size until after the innominate artery. 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 I think the best option for treating this patient is coronary bypass graft surgery as well as Bentall procedure to replace the aortic valve and aortic root with replacement of the ascending aorta and proximal aortic arch to a point between the innominate artery and left common carotid artery. He would require a bypass from the ascending aortic graft up to the innominate artery. This would require right axillary artery cannulation for circulatory arrest. I reviewed the echo and cath findings with him as well as the CTA images. I discussed the alternatives of mechanical and bioprosthetic valves. Both he and his daughter-in-law feel that a bioprosthetic valve would be the best option for him given his living situation and alcohol use. I think that is a reasonable option for him and will have less risk over the long-term. He understands that there is an increased risk of structural valve deterioration with a bioprosthetic valve and accepts that risk. He has bad varicose veins in the right lower extremity so vein harvesting there would not be an option. He does not have any  varicose veins in the left lower extremity and hopefully that vein will be suitable along with a left internal mammary graft. I discussed the operative procedure with the patient and his daughter-in-law including alternatives, benefits and risks; including but not limited to bleeding, blood transfusion, infection, stroke, myocardial infarction, graft failure, heart block requiring a permanent pacemaker, organ dysfunction, and death. Tyler Kemp understands and agrees to proceed.   Plan:   CABG, Bentall using a bioprosthetic valve composite graft, replacement of the ascending aorta and proximal arch with aorto-innominate bypass.  Alleen Borne, MD

## 2018-02-18 ENCOUNTER — Encounter (HOSPITAL_COMMUNITY)
Admission: RE | Disposition: A | Discharge status: Home / Self Care | Payer: Self-pay | Source: Home / Self Care | Attending: Surgery

## 2018-02-18 ENCOUNTER — Inpatient Hospital Stay (HOSPITAL_COMMUNITY): Payer: BLUE CROSS/BLUE SHIELD | Admitting: Anesthesiology

## 2018-02-18 ENCOUNTER — Inpatient Hospital Stay (HOSPITAL_COMMUNITY): Payer: BLUE CROSS/BLUE SHIELD

## 2018-02-18 ENCOUNTER — Encounter (HOSPITAL_COMMUNITY): Payer: Self-pay

## 2018-02-18 ENCOUNTER — Inpatient Hospital Stay (HOSPITAL_COMMUNITY)
Admission: RE | Admit: 2018-02-18 | Discharge: 2018-02-25 | DRG: 220 | Disposition: A | Discharge status: Home / Self Care | Payer: BLUE CROSS/BLUE SHIELD | Attending: Surgery | Admitting: Surgery

## 2018-02-18 ENCOUNTER — Inpatient Hospital Stay (HOSPITAL_COMMUNITY): Payer: BLUE CROSS/BLUE SHIELD | Admitting: Vascular Surgery

## 2018-02-18 DIAGNOSIS — R0602 Shortness of breath: Secondary | ICD-10-CM | POA: Diagnosis not present

## 2018-02-18 DIAGNOSIS — Z7902 Long term (current) use of antithrombotics/antiplatelets: Secondary | ICD-10-CM | POA: Diagnosis not present

## 2018-02-18 DIAGNOSIS — Z4682 Encounter for fitting and adjustment of non-vascular catheter: Secondary | ICD-10-CM | POA: Diagnosis not present

## 2018-02-18 DIAGNOSIS — E8770 Fluid overload, unspecified: Secondary | ICD-10-CM | POA: Diagnosis not present

## 2018-02-18 DIAGNOSIS — E876 Hypokalemia: Secondary | ICD-10-CM | POA: Diagnosis not present

## 2018-02-18 DIAGNOSIS — K222 Esophageal obstruction: Secondary | ICD-10-CM | POA: Diagnosis present

## 2018-02-18 DIAGNOSIS — F17290 Nicotine dependence, other tobacco product, uncomplicated: Secondary | ICD-10-CM | POA: Diagnosis not present

## 2018-02-18 DIAGNOSIS — I350 Nonrheumatic aortic (valve) stenosis: Principal | ICD-10-CM | POA: Diagnosis present

## 2018-02-18 DIAGNOSIS — J181 Lobar pneumonia, unspecified organism: Secondary | ICD-10-CM | POA: Diagnosis not present

## 2018-02-18 DIAGNOSIS — I8391 Asymptomatic varicose veins of right lower extremity: Secondary | ICD-10-CM | POA: Diagnosis not present

## 2018-02-18 DIAGNOSIS — J9811 Atelectasis: Secondary | ICD-10-CM | POA: Diagnosis not present

## 2018-02-18 DIAGNOSIS — Z7982 Long term (current) use of aspirin: Secondary | ICD-10-CM

## 2018-02-18 DIAGNOSIS — Z79899 Other long term (current) drug therapy: Secondary | ICD-10-CM | POA: Diagnosis not present

## 2018-02-18 DIAGNOSIS — K449 Diaphragmatic hernia without obstruction or gangrene: Secondary | ICD-10-CM | POA: Diagnosis present

## 2018-02-18 DIAGNOSIS — Z955 Presence of coronary angioplasty implant and graft: Secondary | ICD-10-CM

## 2018-02-18 DIAGNOSIS — I371 Nonrheumatic pulmonary valve insufficiency: Secondary | ICD-10-CM | POA: Diagnosis not present

## 2018-02-18 DIAGNOSIS — K219 Gastro-esophageal reflux disease without esophagitis: Secondary | ICD-10-CM | POA: Diagnosis not present

## 2018-02-18 DIAGNOSIS — D62 Acute posthemorrhagic anemia: Secondary | ICD-10-CM | POA: Diagnosis not present

## 2018-02-18 DIAGNOSIS — R200 Anesthesia of skin: Secondary | ICD-10-CM | POA: Diagnosis not present

## 2018-02-18 DIAGNOSIS — I272 Pulmonary hypertension, unspecified: Secondary | ICD-10-CM | POA: Diagnosis present

## 2018-02-18 DIAGNOSIS — R531 Weakness: Secondary | ICD-10-CM | POA: Diagnosis not present

## 2018-02-18 DIAGNOSIS — I38 Endocarditis, valve unspecified: Secondary | ICD-10-CM | POA: Diagnosis not present

## 2018-02-18 DIAGNOSIS — I2510 Atherosclerotic heart disease of native coronary artery without angina pectoris: Secondary | ICD-10-CM | POA: Diagnosis not present

## 2018-02-18 DIAGNOSIS — I712 Thoracic aortic aneurysm, without rupture, unspecified: Secondary | ICD-10-CM

## 2018-02-18 DIAGNOSIS — Z9049 Acquired absence of other specified parts of digestive tract: Secondary | ICD-10-CM

## 2018-02-18 DIAGNOSIS — I083 Combined rheumatic disorders of mitral, aortic and tricuspid valves: Secondary | ICD-10-CM | POA: Diagnosis not present

## 2018-02-18 DIAGNOSIS — Z951 Presence of aortocoronary bypass graft: Secondary | ICD-10-CM

## 2018-02-18 DIAGNOSIS — Z952 Presence of prosthetic heart valve: Secondary | ICD-10-CM

## 2018-02-18 DIAGNOSIS — J90 Pleural effusion, not elsewhere classified: Secondary | ICD-10-CM | POA: Diagnosis not present

## 2018-02-18 DIAGNOSIS — I25110 Atherosclerotic heart disease of native coronary artery with unstable angina pectoris: Secondary | ICD-10-CM | POA: Diagnosis not present

## 2018-02-18 DIAGNOSIS — Z09 Encounter for follow-up examination after completed treatment for conditions other than malignant neoplasm: Secondary | ICD-10-CM

## 2018-02-18 DIAGNOSIS — I739 Peripheral vascular disease, unspecified: Secondary | ICD-10-CM | POA: Diagnosis not present

## 2018-02-18 SURGERY — CANCELLED PROCEDURE
Anesthesia: General | Site: Chest

## 2018-02-18 MED ORDER — LACTATED RINGERS IV SOLN
INTRAVENOUS | Status: DC | PRN
  Administered 2018-02-18: 07:00:00 via INTRAVENOUS

## 2018-02-18 MED ORDER — ALPRAZOLAM 0.25 MG PO TABS
0.2500 mg | ORAL_TABLET | ORAL | Status: DC | PRN
  Administered 2018-02-18: 0.5 mg via ORAL
  Administered 2018-02-18: 0.25 mg via ORAL
  Administered 2018-02-19: 0.5 mg via ORAL
  Filled 2018-02-18 (×2): qty 2
  Filled 2018-02-18: qty 1

## 2018-02-18 MED ORDER — MILRINONE LACTATE IN DEXTROSE 20-5 MG/100ML-% IV SOLN
0.1250 ug/kg/min | INTRAVENOUS | Status: DC
  Filled 2018-02-18: qty 100

## 2018-02-18 MED ORDER — CHLORHEXIDINE GLUCONATE 0.12 % MT SOLN
15.0000 mL | Freq: Once | OROMUCOSAL | Status: AC
  Administered 2018-02-18: 15 mL via OROMUCOSAL
  Filled 2018-02-18: qty 15

## 2018-02-18 MED ORDER — TRANEXAMIC ACID (OHS) BOLUS VIA INFUSION
15.0000 mg/kg | INTRAVENOUS | Status: AC
  Administered 2018-02-19: 1551 mg via INTRAVENOUS
  Filled 2018-02-18: qty 1551

## 2018-02-18 MED ORDER — PLASMA-LYTE 148 IV SOLN
INTRAVENOUS | Status: DC
  Filled 2018-02-18: qty 2.5

## 2018-02-18 MED ORDER — TRANEXAMIC ACID (OHS) PUMP PRIME SOLUTION
2.0000 mg/kg | INTRAVENOUS | Status: DC
  Filled 2018-02-18: qty 2.07

## 2018-02-18 MED ORDER — DEXMEDETOMIDINE HCL IN NACL 400 MCG/100ML IV SOLN
0.1000 ug/kg/h | INTRAVENOUS | Status: DC
  Filled 2018-02-18: qty 100

## 2018-02-18 MED ORDER — BISACODYL 5 MG PO TBEC: 5.0000 mg | DELAYED_RELEASE_TABLET | Freq: Once | ORAL | Status: DC

## 2018-02-18 MED ORDER — OXYCODONE HCL 5 MG PO TABS
5.0000 mg | ORAL_TABLET | ORAL | Status: DC | PRN
  Administered 2018-02-18 (×2): 5 mg via ORAL
  Filled 2018-02-18 (×2): qty 1

## 2018-02-18 MED ORDER — PROPOFOL 10 MG/ML IV BOLUS
INTRAVENOUS | Status: AC
  Filled 2018-02-18: qty 20

## 2018-02-18 MED ORDER — SODIUM CHLORIDE 0.9 % IV SOLN
INTRAVENOUS | Status: DC
  Filled 2018-02-18: qty 30

## 2018-02-18 MED ORDER — CHLORHEXIDINE GLUCONATE 0.12 % MT SOLN
15.0000 mL | Freq: Once | OROMUCOSAL | Status: AC
  Administered 2018-02-19: 15 mL via OROMUCOSAL
  Filled 2018-02-18: qty 15

## 2018-02-18 MED ORDER — CHLORHEXIDINE GLUCONATE 4 % EX LIQD: 30.0000 mL | CUTANEOUS | Status: DC

## 2018-02-18 MED ORDER — SODIUM CHLORIDE 0.9% FLUSH: 10.0000 mL | INTRAVENOUS | Status: DC | PRN

## 2018-02-18 MED ORDER — POTASSIUM CHLORIDE 2 MEQ/ML IV SOLN
80.0000 meq | INTRAVENOUS | Status: DC
  Filled 2018-02-18: qty 40

## 2018-02-18 MED ORDER — CHLORHEXIDINE GLUCONATE CLOTH 2 % EX PADS
6.0000 | MEDICATED_PAD | Freq: Once | CUTANEOUS | Status: AC
  Administered 2018-02-18: 6 via TOPICAL

## 2018-02-18 MED ORDER — DOPAMINE-DEXTROSE 3.2-5 MG/ML-% IV SOLN
0.0000 ug/kg/min | INTRAVENOUS | Status: AC
  Administered 2018-02-19: 3 ug/kg/min via INTRAVENOUS
  Filled 2018-02-18: qty 250

## 2018-02-18 MED ORDER — NITROGLYCERIN IN D5W 200-5 MCG/ML-% IV SOLN
2.0000 ug/min | INTRAVENOUS | Status: DC
  Filled 2018-02-18: qty 250

## 2018-02-18 MED ORDER — EPINEPHRINE PF 1 MG/ML IJ SOLN
0.0000 ug/min | INTRAVENOUS | Status: DC
  Filled 2018-02-18: qty 4

## 2018-02-18 MED ORDER — SODIUM CHLORIDE 0.9 % IV SOLN
750.0000 mg | INTRAVENOUS | Status: AC
  Administered 2018-02-19: 750 mg via INTRAVENOUS
  Filled 2018-02-18: qty 750

## 2018-02-18 MED ORDER — FENTANYL CITRATE (PF) 250 MCG/5ML IJ SOLN
INTRAMUSCULAR | Status: AC
  Filled 2018-02-18: qty 25

## 2018-02-18 MED ORDER — CHLORHEXIDINE GLUCONATE CLOTH 2 % EX PADS
6.0000 | MEDICATED_PAD | Freq: Once | CUTANEOUS | Status: AC
  Administered 2018-02-19: 6 via TOPICAL

## 2018-02-18 MED ORDER — MAGNESIUM SULFATE 50 % IJ SOLN
40.0000 meq | INTRAMUSCULAR | Status: DC
  Filled 2018-02-18: qty 9.85

## 2018-02-18 MED ORDER — MIDAZOLAM HCL 10 MG/2ML IJ SOLN
INTRAMUSCULAR | Status: AC
  Filled 2018-02-18: qty 2

## 2018-02-18 MED ORDER — TRANEXAMIC ACID 1000 MG/10ML IV SOLN
1.5000 mg/kg/h | INTRAVENOUS | Status: DC
  Filled 2018-02-18: qty 25

## 2018-02-18 MED ORDER — VANCOMYCIN HCL 10 G IV SOLR
1250.0000 mg | INTRAVENOUS | Status: AC
  Administered 2018-02-19: 1250 mg via INTRAVENOUS
  Filled 2018-02-18: qty 1250

## 2018-02-18 MED ORDER — SODIUM CHLORIDE 0.9 % IV SOLN
1.5000 g | INTRAVENOUS | Status: AC
  Administered 2018-02-19: 1.5 g via INTRAVENOUS
  Filled 2018-02-18: qty 1.5

## 2018-02-18 MED ORDER — SODIUM CHLORIDE 0.9 % IV SOLN
INTRAVENOUS | Status: DC
  Filled 2018-02-18: qty 1

## 2018-02-18 MED ORDER — LACTATED RINGERS IV SOLN
INTRAVENOUS | Status: DC | PRN
  Administered 2018-02-18: 06:00:00 via INTRAVENOUS

## 2018-02-18 MED ORDER — CHLORHEXIDINE GLUCONATE CLOTH 2 % EX PADS: 6.0000 | MEDICATED_PAD | Freq: Every day | CUTANEOUS | Status: DC

## 2018-02-18 MED ORDER — SODIUM CHLORIDE 0.9 % IV SOLN
30.0000 ug/min | INTRAVENOUS | Status: DC
  Filled 2018-02-18: qty 2

## 2018-02-18 MED ORDER — VANCOMYCIN HCL 1000 MG IV SOLR
INTRAVENOUS | Status: DC | PRN
  Administered 2018-02-18: 1500 mg via INTRAVENOUS

## 2018-02-18 MED ORDER — METOPROLOL TARTRATE 12.5 MG HALF TABLET
12.5000 mg | ORAL_TABLET | Freq: Once | ORAL | Status: AC
  Administered 2018-02-19: 12.5 mg via ORAL
  Filled 2018-02-18: qty 1

## 2018-02-18 MED ORDER — MIDAZOLAM HCL 5 MG/5ML IJ SOLN
INTRAMUSCULAR | Status: DC | PRN
  Administered 2018-02-18 (×3): 1 mg via INTRAVENOUS

## 2018-02-18 MED ORDER — TEMAZEPAM 15 MG PO CAPS
15.0000 mg | ORAL_CAPSULE | Freq: Once | ORAL | Status: AC | PRN
  Administered 2018-02-19: 15 mg via ORAL
  Filled 2018-02-18: qty 1

## 2018-02-18 MED ORDER — FENTANYL CITRATE (PF) 250 MCG/5ML IJ SOLN
INTRAMUSCULAR | Status: DC | PRN
  Administered 2018-02-18 (×3): 50 ug via INTRAVENOUS

## 2018-02-18 MED ORDER — METOPROLOL TARTRATE 12.5 MG HALF TABLET: 12.5000 mg | ORAL_TABLET | Freq: Once | ORAL | Status: DC

## 2018-02-18 SURGICAL SUPPLY — 112 items
ADAPTER CARDIO PERF ANTE/RETRO (ADAPTER) IMPLANT
APPLICATOR TIP COSEAL (VASCULAR PRODUCTS) IMPLANT
ATTRACTOMAT 16X20 MAGNETIC DRP (DRAPES) IMPLANT
BAG DECANTER FOR FLEXI CONT (MISCELLANEOUS) IMPLANT
BANDAGE ACE 4X5 VEL STRL LF (GAUZE/BANDAGES/DRESSINGS) IMPLANT
BANDAGE ACE 6X5 VEL STRL LF (GAUZE/BANDAGES/DRESSINGS) IMPLANT
BASKET HEART (ORDER IN 25'S) (MISCELLANEOUS)
BASKET HEART (ORDER IN 25S) (MISCELLANEOUS) IMPLANT
BLADE STERNUM SYSTEM 6 (BLADE) IMPLANT
BLADE SURG 15 STRL LF DISP TIS (BLADE) IMPLANT
BLADE SURG 15 STRL SS (BLADE)
BNDG GAUZE ELAST 4 BULKY (GAUZE/BANDAGES/DRESSINGS) IMPLANT
CANISTER SUCT 3000ML PPV (MISCELLANEOUS) IMPLANT
CANNULA GUNDRY RCSP 15FR (MISCELLANEOUS) IMPLANT
CATH HEART VENT LEFT (CATHETERS) IMPLANT
CATH ROBINSON RED A/P 18FR (CATHETERS) IMPLANT
CATH THORACIC 28FR (CATHETERS) IMPLANT
CATH THORACIC 36FR (CATHETERS) IMPLANT
CATH THORACIC 36FR RT ANG (CATHETERS) IMPLANT
CAUTERY HIGH TEMP VAS (MISCELLANEOUS) IMPLANT
CLIP VESOCCLUDE MED 24/CT (CLIP) IMPLANT
CLIP VESOCCLUDE SM WIDE 24/CT (CLIP) IMPLANT
CONT SPEC 4OZ CLIKSEAL STRL BL (MISCELLANEOUS) IMPLANT
COVER SURGICAL LIGHT HANDLE (MISCELLANEOUS) IMPLANT
CRADLE DONUT ADULT HEAD (MISCELLANEOUS) IMPLANT
DRAPE CARDIOVASCULAR INCISE (DRAPES)
DRAPE SLUSH/WARMER DISC (DRAPES) IMPLANT
DRAPE SRG 135X102X78XABS (DRAPES) IMPLANT
DRSG COVADERM 4X14 (GAUZE/BANDAGES/DRESSINGS) IMPLANT
ELECT CAUTERY BLADE 6.4 (BLADE) IMPLANT
ELECT REM PT RETURN 9FT ADLT (ELECTROSURGICAL)
ELECTRODE REM PT RTRN 9FT ADLT (ELECTROSURGICAL) IMPLANT
FELT TEFLON 1X6 (MISCELLANEOUS) IMPLANT
GAUZE SPONGE 4X4 12PLY STRL (GAUZE/BANDAGES/DRESSINGS) IMPLANT
GLOVE BIO SURGEON STRL SZ 6 (GLOVE) IMPLANT
GLOVE BIO SURGEON STRL SZ 6.5 (GLOVE) IMPLANT
GLOVE BIO SURGEON STRL SZ7 (GLOVE) IMPLANT
GLOVE BIO SURGEON STRL SZ7.5 (GLOVE) IMPLANT
GLOVE BIOGEL PI IND STRL 6 (GLOVE) IMPLANT
GLOVE BIOGEL PI IND STRL 6.5 (GLOVE) IMPLANT
GLOVE BIOGEL PI IND STRL 7.0 (GLOVE) IMPLANT
GLOVE BIOGEL PI INDICATOR 6 (GLOVE)
GLOVE BIOGEL PI INDICATOR 6.5 (GLOVE)
GLOVE BIOGEL PI INDICATOR 7.0 (GLOVE)
GLOVE EUDERMIC 7 POWDERFREE (GLOVE) IMPLANT
GLOVE ORTHO TXT STRL SZ7.5 (GLOVE) IMPLANT
GOWN STRL REUS W/ TWL LRG LVL3 (GOWN DISPOSABLE) IMPLANT
GOWN STRL REUS W/ TWL XL LVL3 (GOWN DISPOSABLE) IMPLANT
GOWN STRL REUS W/TWL LRG LVL3 (GOWN DISPOSABLE)
GOWN STRL REUS W/TWL XL LVL3 (GOWN DISPOSABLE)
HEMOSTAT POWDER SURGIFOAM 1G (HEMOSTASIS) IMPLANT
HEMOSTAT SURGICEL 2X14 (HEMOSTASIS) IMPLANT
INSERT FOGARTY 61MM (MISCELLANEOUS) IMPLANT
INSERT FOGARTY XLG (MISCELLANEOUS) IMPLANT
KIT BASIN OR (CUSTOM PROCEDURE TRAY) IMPLANT
KIT CATH CPB BARTLE (MISCELLANEOUS) IMPLANT
KIT SUCTION CATH 14FR (SUCTIONS) IMPLANT
KIT TURNOVER KIT B (KITS) IMPLANT
KIT VASOVIEW HEMOPRO VH 3000 (KITS) IMPLANT
NS IRRIG 1000ML POUR BTL (IV SOLUTION) IMPLANT
PACK E OPEN HEART (SUTURE) IMPLANT
PACK OPEN HEART (CUSTOM PROCEDURE TRAY) IMPLANT
PAD ARMBOARD 7.5X6 YLW CONV (MISCELLANEOUS) IMPLANT
PAD ELECT DEFIB RADIOL ZOLL (MISCELLANEOUS) IMPLANT
PENCIL BUTTON HOLSTER BLD 10FT (ELECTRODE) IMPLANT
PUNCH AORTIC ROTATE 4.0MM (MISCELLANEOUS) IMPLANT
PUNCH AORTIC ROTATE 4.5MM 8IN (MISCELLANEOUS) IMPLANT
PUNCH AORTIC ROTATE 5MM 8IN (MISCELLANEOUS) IMPLANT
SEALANT SURG COSEAL 8ML (VASCULAR PRODUCTS) IMPLANT
SPONGE INTESTINAL PEANUT (DISPOSABLE) IMPLANT
SPONGE LAP 18X18 X RAY DECT (DISPOSABLE) IMPLANT
SPONGE LAP 4X18 RFD (DISPOSABLE) IMPLANT
SUT BONE WAX W31G (SUTURE) IMPLANT
SUT ETHIBON 2 0 V 52N 30 (SUTURE) IMPLANT
SUT ETHIBON EXCEL 2-0 V-5 (SUTURE) IMPLANT
SUT ETHIBOND V-5 VALVE (SUTURE) IMPLANT
SUT MNCRL AB 4-0 PS2 18 (SUTURE) IMPLANT
SUT PROLENE 3 0 SH 1 (SUTURE) IMPLANT
SUT PROLENE 3 0 SH DA (SUTURE) IMPLANT
SUT PROLENE 3 0 SH1 36 (SUTURE) IMPLANT
SUT PROLENE 4 0 RB 1 (SUTURE)
SUT PROLENE 4 0 SH DA (SUTURE) IMPLANT
SUT PROLENE 4-0 RB1 .5 CRCL 36 (SUTURE) IMPLANT
SUT PROLENE 5 0 C 1 36 (SUTURE) IMPLANT
SUT PROLENE 5 0 RB 2 (SUTURE) IMPLANT
SUT PROLENE 6 0 C 1 30 (SUTURE) IMPLANT
SUT PROLENE 7 0 BV 1 (SUTURE) IMPLANT
SUT PROLENE 7 0 BV1 MDA (SUTURE) IMPLANT
SUT PROLENE 8 0 BV175 6 (SUTURE) IMPLANT
SUT SILK  1 MH (SUTURE)
SUT SILK 1 MH (SUTURE) IMPLANT
SUT STEEL 6MS V (SUTURE) IMPLANT
SUT STEEL STERNAL CCS#1 18IN (SUTURE) IMPLANT
SUT STEEL SZ 6 DBL 3X14 BALL (SUTURE) IMPLANT
SUT VIC AB 1 CTX 36 (SUTURE)
SUT VIC AB 1 CTX36XBRD ANBCTR (SUTURE) IMPLANT
SUT VIC AB 2-0 CT1 27 (SUTURE)
SUT VIC AB 2-0 CT1 TAPERPNT 27 (SUTURE) IMPLANT
SUT VIC AB 2-0 CTX 27 (SUTURE) IMPLANT
SUT VIC AB 3-0 SH 27 (SUTURE)
SUT VIC AB 3-0 SH 27X BRD (SUTURE) IMPLANT
SUT VIC AB 3-0 X1 27 (SUTURE) IMPLANT
SUT VICRYL 4-0 PS2 18IN ABS (SUTURE) IMPLANT
SYSTEM SAHARA CHEST DRAIN ATS (WOUND CARE) IMPLANT
TOWEL GREEN STERILE (TOWEL DISPOSABLE) IMPLANT
TOWEL GREEN STERILE FF (TOWEL DISPOSABLE) IMPLANT
TRAY FOLEY SLVR 14FR TEMP STAT (SET/KITS/TRAYS/PACK) IMPLANT
TRAY FOLEY SLVR 16FR TEMP STAT (SET/KITS/TRAYS/PACK) IMPLANT
TUBING INSUFFLATION (TUBING) IMPLANT
UNDERPAD 30X30 (UNDERPADS AND DIAPERS) IMPLANT
VENT LEFT HEART 12002 (CATHETERS)
WATER STERILE IRR 1000ML POUR (IV SOLUTION) IMPLANT
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
## 2018-02-18 NOTE — Transfer of Care (Addendum)
Immediate Anesthesia Transfer of Care Note  Patient: Tyler RichterDavid Kemp  Procedure(s) Performed: CORONARY ARTERY BYPASS GRAFTING (CABG) (N/A Chest) BENTALL PROCEDURE (N/A Chest) REPLACEMENT ASCENDING AORTA (N/A ) TRANSESOPHAGEAL ECHOCARDIOGRAM (TEE) (N/A ) CANCELLED PROCEDURE  Patient Location: ICU  Anesthesia Type:no anesthesia given, case cancelled  Level of Consciousness:   Airway & Oxygen Therapy:  Post-op Assessment:   Post vital signs:   Last Vitals:  Vitals Value Taken Time  BP    Temp    Pulse 62 02/18/2018  8:58 AM  Resp 14 02/18/2018  8:58 AM  SpO2 99 % 02/18/2018  8:58 AM  Vitals shown include unvalidated device data.  Last Pain:  Vitals:   02/18/18 0604  PainSc: 0-No pain         Complications:

## 2018-02-18 NOTE — Anesthesia Procedure Notes (Signed)
Central Venous Catheter Insertion Performed by: Kipp BroodJoslin, Bhavya, MD, anesthesiologist Start/End7/03/2018 6:35 AM, 02/18/2018 6:45 AM Patient location: Pre-op. Preanesthetic checklist: patient identified, IV checked, site marked, risks and benefits discussed, surgical consent, monitors and equipment checked, pre-op evaluation, timeout performed and anesthesia consent Hand hygiene performed  and maximum sterile barriers used  PA cath was placed.Swan type:thermodilution Procedure performed without using ultrasound guided technique. Attempts: 1 Following insertion, line sutured, dressing applied and Biopatch. Post procedure assessment: blood return through all ports  Patient tolerated the procedure well with no immediate complications.

## 2018-02-18 NOTE — Anesthesia Procedure Notes (Addendum)
Arterial Line Insertion Start/End7/03/2018 6:55 AM, 02/18/2018 6:59 AM Performed by: Julian ReilWelty, Bron Snellings F, CRNA, CRNA  Patient location: Pre-op. Preanesthetic checklist: patient identified, IV checked, site marked, risks and benefits discussed, surgical consent, monitors and equipment checked, pre-op evaluation and timeout performed Lidocaine 1% used for infiltration and patient sedated Left, radial was placed Catheter size: 20 G Hand hygiene performed  and maximum sterile barriers used   Attempts: 1 Procedure performed without using ultrasound guided technique. Following insertion, dressing applied and Biopatch. Post procedure assessment: normal  Patient tolerated the procedure well with no immediate complications. Additional procedure comments: Left radial art line placement per Bertram DenverM Furr, SRNA while supervised by [facility]el[facility] CRNA.

## 2018-02-18 NOTE — Interval H&P Note (Signed)
History and Physical Interval Note:  02/18/2018 7:23 AM  Remonia Richteravid Sedeno  has presented today for surgery, with the diagnosis of CAD AS TAA  The various methods of treatment have been discussed with the patient and family. After consideration of risks, benefits and other options for treatment, the patient has consented to  Procedure(s) with comments: CORONARY ARTERY BYPASS GRAFTING (CABG) (N/A) BENTALL PROCEDURE (N/A) - RIGHT AXILLARY CANNULATION  CIRC ARREST  BILATERAL RADIAL ARTERIAL LINES REPLACEMENT ASCENDING AORTA (N/A) TRANSESOPHAGEAL ECHOCARDIOGRAM (TEE) (N/A) as a surgical intervention .  The patient's history has been reviewed, patient examined, no change in status, stable for surgery.  I have reviewed the patient's chart and labs.  Questions were answered to the patient's satisfaction.     Alleen BorneBryan K Jahanna Raether

## 2018-02-18 NOTE — Anesthesia Procedure Notes (Signed)
Central Venous Catheter Insertion Performed by: Kipp BroodJoslin, Kodee, MD, anesthesiologist Start/End7/03/2018 6:35 AM, 02/18/2018 6:45 AM Patient location: Pre-op. Preanesthetic checklist: patient identified, IV checked, site marked, risks and benefits discussed, surgical consent, monitors and equipment checked, pre-op evaluation, timeout performed and anesthesia consent Lidocaine 1% used for infiltration and patient sedated Hand hygiene performed  and maximum sterile barriers used  Catheter size: 9 Fr Sheath introducer Procedure performed using ultrasound guided technique. Ultrasound Notes:anatomy identified, needle tip was noted to be adjacent to the nerve/plexus identified, no ultrasound evidence of intravascular and/or intraneural injection and image(s) printed for medical record Attempts: 1 Following insertion, line sutured and dressing applied. Post procedure assessment: blood return through all ports, free fluid flow and no air  Patient tolerated the procedure well with no immediate complications. Additional procedure comments: Bleeding noted at insertion site. Horizontal mattress suture x 2 with 3.0 silk applied..Marland Kitchen

## 2018-02-18 NOTE — Anesthesia Procedure Notes (Signed)
Arterial Line Insertion Start/End7/03/2018 6:39 AM, 02/18/2018 6:42 AM Performed by: Adair LaundryPaxton, Lynn A, CRNA, CRNA  Patient location: Pre-op. Preanesthetic checklist: patient identified, IV checked, site marked, risks and benefits discussed, surgical consent, monitors and equipment checked, pre-op evaluation and timeout performed Lidocaine 1% used for infiltration and patient sedated Right, radial was placed Catheter size: 20 G Hand hygiene performed  and maximum sterile barriers used   Attempts: 1 Procedure performed without using ultrasound guided technique. Following insertion, dressing applied and Biopatch. Post procedure assessment: normal  Patient tolerated the procedure well with no immediate complications.

## 2018-02-19 ENCOUNTER — Inpatient Hospital Stay (HOSPITAL_COMMUNITY): Payer: BLUE CROSS/BLUE SHIELD | Admitting: Certified Registered Nurse Anesthetist

## 2018-02-19 ENCOUNTER — Inpatient Hospital Stay (HOSPITAL_COMMUNITY): Payer: BLUE CROSS/BLUE SHIELD

## 2018-02-19 ENCOUNTER — Encounter (HOSPITAL_COMMUNITY)
Admission: RE | Disposition: A | Discharge status: Home / Self Care | Payer: Self-pay | Source: Home / Self Care | Attending: Surgery

## 2018-02-19 ENCOUNTER — Encounter (HOSPITAL_COMMUNITY): Payer: Self-pay | Admitting: Certified Registered Nurse Anesthetist

## 2018-02-19 DIAGNOSIS — Z952 Presence of prosthetic heart valve: Secondary | ICD-10-CM

## 2018-02-19 HISTORY — PX: TEE WITHOUT CARDIOVERSION: SHX5443

## 2018-02-19 HISTORY — PX: REPLACEMENT ASCENDING AORTA: SHX6068

## 2018-02-19 HISTORY — PX: CORONARY ARTERY BYPASS GRAFT: SHX141

## 2018-02-19 HISTORY — PX: BENTALL PROCEDURE: SHX5058

## 2018-02-19 HISTORY — DX: Presence of prosthetic heart valve: Z95.2

## 2018-02-19 LAB — POCT I-STAT, CHEM 8
BUN: 8 mg/dL (ref 6–20)
BUN: 9 mg/dL (ref 6–20)
BUN: 8 mg/dL (ref 6–20)
BUN: 9 mg/dL (ref 6–20)
BUN: 9 mg/dL (ref 6–20)
BUN: 9 mg/dL (ref 6–20)
BUN: 9 mg/dL (ref 6–20)
BUN: 9 mg/dL (ref 6–20)
BUN: 11 mg/dL (ref 6–20)
CALCIUM ION: 1.22 mmol/L (ref 1.15–1.40)
CALCIUM ION: 1.07 mmol/L — AB (ref 1.15–1.40)
CALCIUM ION: 1.07 mmol/L — AB (ref 1.15–1.40)
CALCIUM ION: 1.02 mmol/L — AB (ref 1.15–1.40)
CALCIUM ION: 0.97 mmol/L — AB (ref 1.15–1.40)
CALCIUM ION: 1.15 mmol/L (ref 1.15–1.40)
CHLORIDE: 102 mmol/L (ref 98–111)
CHLORIDE: 101 mmol/L (ref 98–111)
CHLORIDE: 102 mmol/L (ref 98–111)
CREATININE: 0.5 mg/dL — AB (ref 0.61–1.24)
CREATININE: 0.7 mg/dL (ref 0.61–1.24)
CREATININE: 0.5 mg/dL — AB (ref 0.61–1.24)
CREATININE: 0.5 mg/dL — AB (ref 0.61–1.24)
CREATININE: 0.5 mg/dL — AB (ref 0.61–1.24)
CREATININE: 0.4 mg/dL — AB (ref 0.61–1.24)
CREATININE: 0.6 mg/dL — AB (ref 0.61–1.24)
Calcium, Ion: 1.25 mmol/L (ref 1.15–1.40)
Calcium, Ion: 1.12 mmol/L — ABNORMAL LOW (ref 1.15–1.40)
Calcium, Ion: 1.03 mmol/L — ABNORMAL LOW (ref 1.15–1.40)
Chloride: 101 mmol/L (ref 98–111)
Chloride: 102 mmol/L (ref 98–111)
Chloride: 99 mmol/L (ref 98–111)
Chloride: 104 mmol/L (ref 98–111)
Chloride: 103 mmol/L (ref 98–111)
Chloride: 106 mmol/L (ref 98–111)
Creatinine, Ser: 0.5 mg/dL — ABNORMAL LOW (ref 0.61–1.24)
Creatinine, Ser: 0.5 mg/dL — ABNORMAL LOW (ref 0.61–1.24)
GLUCOSE: 98 mg/dL (ref 70–99)
GLUCOSE: 157 mg/dL — AB (ref 70–99)
GLUCOSE: 161 mg/dL — AB (ref 70–99)
GLUCOSE: 144 mg/dL — AB (ref 70–99)
GLUCOSE: 170 mg/dL — AB (ref 70–99)
GLUCOSE: 145 mg/dL — AB (ref 70–99)
Glucose, Bld: 107 mg/dL — ABNORMAL HIGH (ref 70–99)
Glucose, Bld: 111 mg/dL — ABNORMAL HIGH (ref 70–99)
Glucose, Bld: 171 mg/dL — ABNORMAL HIGH (ref 70–99)
HCT: 33 % — ABNORMAL LOW (ref 39.0–52.0)
HCT: 25 % — ABNORMAL LOW (ref 39.0–52.0)
HCT: 24 % — ABNORMAL LOW (ref 39.0–52.0)
HCT: 21 % — ABNORMAL LOW (ref 39.0–52.0)
HCT: 24 % — ABNORMAL LOW (ref 39.0–52.0)
HCT: 24 % — ABNORMAL LOW (ref 39.0–52.0)
HEMATOCRIT: 29 % — AB (ref 39.0–52.0)
HEMATOCRIT: 25 % — AB (ref 39.0–52.0)
HEMATOCRIT: 23 % — AB (ref 39.0–52.0)
HEMOGLOBIN: 9.9 g/dL — AB (ref 13.0–17.0)
HEMOGLOBIN: 8.5 g/dL — AB (ref 13.0–17.0)
HEMOGLOBIN: 8.2 g/dL — AB (ref 13.0–17.0)
Hemoglobin: 11.2 g/dL — ABNORMAL LOW (ref 13.0–17.0)
Hemoglobin: 8.5 g/dL — ABNORMAL LOW (ref 13.0–17.0)
Hemoglobin: 7.8 g/dL — ABNORMAL LOW (ref 13.0–17.0)
Hemoglobin: 8.2 g/dL — ABNORMAL LOW (ref 13.0–17.0)
Hemoglobin: 7.1 g/dL — ABNORMAL LOW (ref 13.0–17.0)
Hemoglobin: 8.2 g/dL — ABNORMAL LOW (ref 13.0–17.0)
POTASSIUM: 3.9 mmol/L (ref 3.5–5.1)
POTASSIUM: 4.5 mmol/L (ref 3.5–5.1)
Potassium: 4 mmol/L (ref 3.5–5.1)
Potassium: 4.2 mmol/L (ref 3.5–5.1)
Potassium: 4 mmol/L (ref 3.5–5.1)
Potassium: 4.3 mmol/L (ref 3.5–5.1)
Potassium: 4.4 mmol/L (ref 3.5–5.1)
Potassium: 5.1 mmol/L (ref 3.5–5.1)
Potassium: 3.9 mmol/L (ref 3.5–5.1)
SODIUM: 139 mmol/L (ref 135–145)
SODIUM: 137 mmol/L (ref 135–145)
SODIUM: 139 mmol/L (ref 135–145)
SODIUM: 136 mmol/L (ref 135–145)
SODIUM: 137 mmol/L (ref 135–145)
Sodium: 135 mmol/L (ref 135–145)
Sodium: 136 mmol/L (ref 135–145)
Sodium: 137 mmol/L (ref 135–145)
Sodium: 140 mmol/L (ref 135–145)
TCO2: 27 mmol/L (ref 22–32)
TCO2: 26 mmol/L (ref 22–32)
TCO2: 26 mmol/L (ref 22–32)
TCO2: 26 mmol/L (ref 22–32)
TCO2: 24 mmol/L (ref 22–32)
TCO2: 25 mmol/L (ref 22–32)
TCO2: 23 mmol/L (ref 22–32)
TCO2: 19 mmol/L — AB (ref 22–32)
TCO2: 22 mmol/L (ref 22–32)

## 2018-02-19 LAB — POCT I-STAT 3, ART BLOOD GAS (G3+)
ACID-BASE EXCESS: 1 mmol/L (ref 0.0–2.0)
Bicarbonate: 25.9 mmol/L (ref 20.0–28.0)
Bicarbonate: 26.2 mmol/L (ref 20.0–28.0)
O2 SAT: 100 %
O2 SAT: 100 %
PCO2 ART: 47.6 mmHg (ref 32.0–48.0)
PH ART: 7.344 — AB (ref 7.350–7.450)
PO2 ART: 322 mmHg — AB (ref 83.0–108.0)
PO2 ART: 471 mmHg — AB (ref 83.0–108.0)
TCO2: 27 mmol/L (ref 22–32)
TCO2: 27 mmol/L (ref 22–32)
pCO2 arterial: 41.4 mmHg (ref 32.0–48.0)
pH, Arterial: 7.41 (ref 7.350–7.450)

## 2018-02-19 LAB — GLUCOSE, CAPILLARY
GLUCOSE-CAPILLARY: 78 mg/dL (ref 70–99)
GLUCOSE-CAPILLARY: 109 mg/dL — AB (ref 70–99)
Glucose-Capillary: 108 mg/dL — ABNORMAL HIGH (ref 70–99)

## 2018-02-19 LAB — FIBRINOGEN
Fibrinogen: 171 mg/dL — ABNORMAL LOW (ref 210–475)
Fibrinogen: 259 mg/dL (ref 210–475)

## 2018-02-19 LAB — CBC
HEMATOCRIT: 31.3 % — AB (ref 39.0–52.0)
HEMOGLOBIN: 10.4 g/dL — AB (ref 13.0–17.0)
MCH: 28.2 pg (ref 26.0–34.0)
MCHC: 33.2 g/dL (ref 30.0–36.0)
MCV: 84.8 fL (ref 78.0–100.0)
Platelets: 87 10*3/uL — ABNORMAL LOW (ref 150–400)
RBC: 3.69 MIL/uL — ABNORMAL LOW (ref 4.22–5.81)
RDW: 13.2 % (ref 11.5–15.5)
WBC: 12.6 10*3/uL — AB (ref 4.0–10.5)

## 2018-02-19 LAB — PROTIME-INR
INR: 1.41
Prothrombin Time: 17.1 seconds — ABNORMAL HIGH (ref 11.4–15.2)

## 2018-02-19 LAB — PREPARE RBC (CROSSMATCH)

## 2018-02-19 LAB — PLATELET COUNT: Platelets: 85 10*3/uL — ABNORMAL LOW (ref 150–400)

## 2018-02-19 LAB — APTT: APTT: 36 s (ref 24–36)

## 2018-02-19 LAB — HEMOGLOBIN AND HEMATOCRIT, BLOOD
HCT: 26.9 % — ABNORMAL LOW (ref 39.0–52.0)
Hemoglobin: 8.9 g/dL — ABNORMAL LOW (ref 13.0–17.0)

## 2018-02-19 SURGERY — CORONARY ARTERY BYPASS GRAFTING (CABG)
Anesthesia: General | Site: Chest

## 2018-02-19 MED ORDER — LACTATED RINGERS IV SOLN
INTRAVENOUS | Status: DC | PRN
  Administered 2018-02-19 (×2): via INTRAVENOUS

## 2018-02-19 MED ORDER — NITROGLYCERIN IN D5W 200-5 MCG/ML-% IV SOLN: 0.0000 ug/min | INTRAVENOUS | Status: DC

## 2018-02-19 MED ORDER — MORPHINE SULFATE (PF) 2 MG/ML IV SOLN
2.0000 mg | INTRAVENOUS | Status: DC | PRN
  Administered 2018-02-19 – 2018-02-21 (×4): 2 mg via INTRAVENOUS
  Filled 2018-02-19 (×3): qty 1

## 2018-02-19 MED ORDER — CHLORHEXIDINE GLUCONATE 0.12% ORAL RINSE (MEDLINE KIT)
15.0000 mL | Freq: Two times a day (BID) | OROMUCOSAL | Status: DC
  Administered 2018-02-19: 15 mL via OROMUCOSAL

## 2018-02-19 MED ORDER — LACTATED RINGERS IV SOLN
INTRAVENOUS | Status: DC | PRN
  Administered 2018-02-19 (×2): via INTRAVENOUS

## 2018-02-19 MED ORDER — KENNESTONE BLOOD CARDIOPLEGIA (KBC) MANNITOL SYRINGE (20%, 32ML)
32.0000 mL | INTRAVENOUS | Status: DC
  Filled 2018-02-19: qty 32

## 2018-02-19 MED ORDER — PROTAMINE SULFATE 10 MG/ML IV SOLN
INTRAVENOUS | Status: DC | PRN
  Administered 2018-02-19 (×6): 50 mg via INTRAVENOUS

## 2018-02-19 MED ORDER — TRANEXAMIC ACID 1000 MG/10ML IV SOLN
INTRAVENOUS | Status: DC | PRN
  Administered 2018-02-19 (×2): 1.5 mg/kg/h via INTRAVENOUS

## 2018-02-19 MED ORDER — SODIUM CHLORIDE 0.9% IV SOLUTION: Freq: Once | INTRAVENOUS | Status: DC

## 2018-02-19 MED ORDER — SODIUM CHLORIDE 0.9 % IV SOLN
INTRAVENOUS | Status: DC | PRN
  Administered 2018-02-19: 1 [IU]/h via INTRAVENOUS

## 2018-02-19 MED ORDER — METHYLPREDNISOLONE SODIUM SUCC 125 MG IJ SOLR
125.0000 mg | INTRAMUSCULAR | Status: AC
  Administered 2018-02-19: 125 mg via INTRAVENOUS
  Filled 2018-02-19: qty 2

## 2018-02-19 MED ORDER — TRAMADOL HCL 50 MG PO TABS: 50.0000 mg | ORAL_TABLET | ORAL | Status: DC | PRN

## 2018-02-19 MED ORDER — PROTAMINE SULFATE 10 MG/ML IV SOLN
INTRAVENOUS | Status: AC
  Filled 2018-02-19: qty 50

## 2018-02-19 MED ORDER — SODIUM CHLORIDE 0.45 % IV SOLN
INTRAVENOUS | Status: DC | PRN
  Administered 2018-02-19: 19:00:00 via INTRAVENOUS

## 2018-02-19 MED ORDER — ASPIRIN EC 325 MG PO TBEC
325.0000 mg | DELAYED_RELEASE_TABLET | Freq: Every day | ORAL | Status: DC
  Administered 2018-02-20 – 2018-02-22 (×3): 325 mg via ORAL
  Filled 2018-02-19 (×3): qty 1

## 2018-02-19 MED ORDER — DEXMEDETOMIDINE HCL IN NACL 200 MCG/50ML IV SOLN
INTRAVENOUS | Status: AC
  Filled 2018-02-19: qty 50

## 2018-02-19 MED ORDER — SODIUM CHLORIDE 0.9% FLUSH: 10.0000 mL | INTRAVENOUS | Status: DC | PRN

## 2018-02-19 MED ORDER — ALBUMIN HUMAN 5 % IV SOLN
INTRAVENOUS | Status: DC | PRN
  Administered 2018-02-19 (×3): via INTRAVENOUS

## 2018-02-19 MED ORDER — PROPOFOL 10 MG/ML IV BOLUS
INTRAVENOUS | Status: DC | PRN
  Administered 2018-02-19: 40 mg via INTRAVENOUS
  Administered 2018-02-19: 200 mg via INTRAVENOUS
  Administered 2018-02-19: 100 mg via INTRAVENOUS

## 2018-02-19 MED ORDER — METOPROLOL TARTRATE 25 MG/10 ML ORAL SUSPENSION: 12.5000 mg | Freq: Two times a day (BID) | ORAL | Status: DC

## 2018-02-19 MED ORDER — LACTATED RINGERS IV SOLN: 500.0000 mL | Freq: Once | INTRAVENOUS | Status: DC | PRN

## 2018-02-19 MED ORDER — PROMETHAZINE HCL 25 MG/ML IJ SOLN: 6.2500 mg | INTRAMUSCULAR | Status: DC | PRN

## 2018-02-19 MED ORDER — HYDROMORPHONE HCL 1 MG/ML IJ SOLN: 0.2500 mg | INTRAMUSCULAR | Status: DC | PRN

## 2018-02-19 MED ORDER — SODIUM CHLORIDE 0.9% FLUSH
10.0000 mL | Freq: Two times a day (BID) | INTRAVENOUS | Status: DC
  Administered 2018-02-19 – 2018-02-22 (×4): 10 mL

## 2018-02-19 MED ORDER — OXYCODONE HCL 5 MG PO TABS
5.0000 mg | ORAL_TABLET | ORAL | Status: DC | PRN
  Administered 2018-02-20 (×2): 5 mg via ORAL
  Administered 2018-02-20: 10 mg via ORAL
  Administered 2018-02-20 – 2018-02-21 (×2): 5 mg via ORAL
  Administered 2018-02-21 (×2): 10 mg via ORAL
  Administered 2018-02-21: 5 mg via ORAL
  Filled 2018-02-19 (×2): qty 1
  Filled 2018-02-19: qty 2
  Filled 2018-02-19: qty 1
  Filled 2018-02-19 (×2): qty 2
  Filled 2018-02-19: qty 1
  Filled 2018-02-19: qty 2

## 2018-02-19 MED ORDER — OXYCODONE HCL 5 MG PO TABS: 5.0000 mg | ORAL_TABLET | Freq: Once | ORAL | Status: DC | PRN

## 2018-02-19 MED ORDER — DOPAMINE-DEXTROSE 3.2-5 MG/ML-% IV SOLN
3.0000 ug/kg/min | INTRAVENOUS | Status: DC
  Administered 2018-02-19: 3 ug/kg/min via INTRAVENOUS

## 2018-02-19 MED ORDER — MIDAZOLAM HCL 10 MG/2ML IJ SOLN
INTRAMUSCULAR | Status: AC
  Filled 2018-02-19: qty 2

## 2018-02-19 MED ORDER — PANTOPRAZOLE SODIUM 40 MG PO TBEC
40.0000 mg | DELAYED_RELEASE_TABLET | Freq: Every day | ORAL | Status: DC
  Administered 2018-02-21 – 2018-02-22 (×2): 40 mg via ORAL
  Filled 2018-02-19 (×2): qty 1

## 2018-02-19 MED ORDER — VANCOMYCIN HCL IN DEXTROSE 1-5 GM/200ML-% IV SOLN
1000.0000 mg | Freq: Once | INTRAVENOUS | Status: AC
  Administered 2018-02-20: 1000 mg via INTRAVENOUS
  Filled 2018-02-19: qty 200

## 2018-02-19 MED ORDER — METOPROLOL TARTRATE 5 MG/5ML IV SOLN: 2.5000 mg | INTRAVENOUS | Status: DC | PRN

## 2018-02-19 MED ORDER — POTASSIUM CHLORIDE 10 MEQ/50ML IV SOLN
10.0000 meq | INTRAVENOUS | Status: AC
  Administered 2018-02-19 (×3): 10 meq via INTRAVENOUS

## 2018-02-19 MED ORDER — PROPOFOL 10 MG/ML IV BOLUS
INTRAVENOUS | Status: AC
  Filled 2018-02-19: qty 40

## 2018-02-19 MED ORDER — ACETAMINOPHEN 500 MG PO TABS
1000.0000 mg | ORAL_TABLET | Freq: Four times a day (QID) | ORAL | Status: DC
  Administered 2018-02-20 – 2018-02-22 (×9): 1000 mg via ORAL
  Filled 2018-02-19 (×9): qty 2

## 2018-02-19 MED ORDER — THROMBIN 5000 UNITS EX SOLR
CUTANEOUS | Status: AC
  Filled 2018-02-19: qty 5000

## 2018-02-19 MED ORDER — FAMOTIDINE IN NACL 20-0.9 MG/50ML-% IV SOLN
20.0000 mg | Freq: Two times a day (BID) | INTRAVENOUS | Status: AC
  Administered 2018-02-19 (×2): 20 mg via INTRAVENOUS
  Filled 2018-02-19: qty 50

## 2018-02-19 MED ORDER — ROCURONIUM BROMIDE 10 MG/ML (PF) SYRINGE
PREFILLED_SYRINGE | INTRAVENOUS | Status: AC
  Filled 2018-02-19: qty 40

## 2018-02-19 MED ORDER — PHENYLEPHRINE HCL-NACL 20-0.9 MG/250ML-% IV SOLN
0.0000 ug/min | INTRAVENOUS | Status: DC
  Administered 2018-02-19: 5 ug/min via INTRAVENOUS
  Filled 2018-02-19: qty 250

## 2018-02-19 MED ORDER — ONDANSETRON HCL 4 MG/2ML IJ SOLN
4.0000 mg | Freq: Four times a day (QID) | INTRAMUSCULAR | Status: DC | PRN
  Administered 2018-02-20 – 2018-02-21 (×3): 4 mg via INTRAVENOUS
  Filled 2018-02-19 (×3): qty 2

## 2018-02-19 MED ORDER — MAGNESIUM SULFATE 4 GM/100ML IV SOLN
4.0000 g | Freq: Once | INTRAVENOUS | Status: AC
  Administered 2018-02-19: 4 g via INTRAVENOUS
  Filled 2018-02-19: qty 100

## 2018-02-19 MED ORDER — SODIUM CHLORIDE 0.9 % IV SOLN: 250.0000 mL | INTRAVENOUS | Status: DC

## 2018-02-19 MED ORDER — LACTATED RINGERS IV SOLN: INTRAVENOUS | Status: DC

## 2018-02-19 MED ORDER — LACTATED RINGERS IV SOLN
INTRAVENOUS | Status: DC | PRN
  Administered 2018-02-19: 07:00:00 via INTRAVENOUS

## 2018-02-19 MED ORDER — ACETAMINOPHEN 650 MG RE SUPP
650.0000 mg | Freq: Once | RECTAL | Status: AC
  Administered 2018-02-19: 650 mg via RECTAL

## 2018-02-19 MED ORDER — KENNESTONE BLOOD CARDIOPLEGIA (KBC) MANNITOL SYRINGE (20%, 32ML)
32.0000 mL | Freq: Once | INTRAVENOUS | Status: DC
  Filled 2018-02-19: qty 32

## 2018-02-19 MED ORDER — DEXTROSE 5 % IV SOLN
INTRAVENOUS | Status: DC | PRN
  Administered 2018-02-19: 10 ug/min via INTRAVENOUS

## 2018-02-19 MED ORDER — SODIUM CHLORIDE 0.9 % IV SOLN
INTRAVENOUS | Status: DC
  Filled 2018-02-19: qty 1

## 2018-02-19 MED ORDER — FENTANYL CITRATE (PF) 250 MCG/5ML IJ SOLN
INTRAMUSCULAR | Status: DC | PRN
  Administered 2018-02-19: 150 ug via INTRAVENOUS
  Administered 2018-02-19 (×3): 250 ug via INTRAVENOUS
  Administered 2018-02-19: 100 ug via INTRAVENOUS
  Administered 2018-02-19 (×2): 250 ug via INTRAVENOUS

## 2018-02-19 MED ORDER — ASPIRIN 81 MG PO CHEW: 324.0000 mg | CHEWABLE_TABLET | Freq: Every day | ORAL | Status: DC

## 2018-02-19 MED ORDER — HEPARIN SODIUM (PORCINE) 1000 UNIT/ML IJ SOLN
INTRAMUSCULAR | Status: AC
  Filled 2018-02-19: qty 1

## 2018-02-19 MED ORDER — MIDAZOLAM HCL 5 MG/5ML IJ SOLN
INTRAMUSCULAR | Status: DC | PRN
  Administered 2018-02-19: 3 mg via INTRAVENOUS
  Administered 2018-02-19: 2 mg via INTRAVENOUS
  Administered 2018-02-19: 1 mg via INTRAVENOUS
  Administered 2018-02-19: 4 mg via INTRAVENOUS

## 2018-02-19 MED ORDER — TRANEXAMIC ACID 1000 MG/10ML IV SOLN
1.5000 mg/kg/h | INTRAVENOUS | Status: DC
  Filled 2018-02-19: qty 10

## 2018-02-19 MED ORDER — ACETAMINOPHEN 160 MG/5ML PO SOLN
1000.0000 mg | Freq: Four times a day (QID) | ORAL | Status: DC
  Administered 2018-02-19: 1000 mg
  Filled 2018-02-19: qty 40.6

## 2018-02-19 MED ORDER — METOPROLOL TARTRATE 12.5 MG HALF TABLET: 12.5000 mg | ORAL_TABLET | Freq: Two times a day (BID) | ORAL | Status: DC

## 2018-02-19 MED ORDER — ROCURONIUM BROMIDE 100 MG/10ML IV SOLN
INTRAVENOUS | Status: DC | PRN
  Administered 2018-02-19 (×3): 100 mg via INTRAVENOUS

## 2018-02-19 MED ORDER — CHLORHEXIDINE GLUCONATE 0.12 % MT SOLN: 15.0000 mL | OROMUCOSAL | Status: AC

## 2018-02-19 MED ORDER — SODIUM CHLORIDE 0.9 % IV SOLN
INTRAVENOUS | Status: DC
  Administered 2018-02-19 – 2018-02-21 (×2): via INTRAVENOUS

## 2018-02-19 MED ORDER — ALBUMIN HUMAN 5 % IV SOLN
250.0000 mL | INTRAVENOUS | Status: DC | PRN
  Administered 2018-02-19: 250 mL via INTRAVENOUS

## 2018-02-19 MED ORDER — FENTANYL CITRATE (PF) 250 MCG/5ML IJ SOLN
INTRAMUSCULAR | Status: AC
  Filled 2018-02-19: qty 25

## 2018-02-19 MED ORDER — SODIUM CHLORIDE 0.9% FLUSH
3.0000 mL | Freq: Two times a day (BID) | INTRAVENOUS | Status: DC
  Administered 2018-02-20 – 2018-02-22 (×3): 3 mL via INTRAVENOUS

## 2018-02-19 MED ORDER — SODIUM CHLORIDE 0.9% IV SOLUTION
Freq: Once | INTRAVENOUS | Status: AC
  Administered 2018-02-19: 21:00:00 via INTRAVENOUS

## 2018-02-19 MED ORDER — INSULIN REGULAR BOLUS VIA INFUSION
0.0000 [IU] | Freq: Three times a day (TID) | INTRAVENOUS | Status: DC
  Filled 2018-02-19: qty 10

## 2018-02-19 MED ORDER — SODIUM CHLORIDE 0.9 % IV SOLN
INTRAVENOUS | Status: DC | PRN
  Administered 2018-02-19: 0.2 ug/kg/h via INTRAVENOUS

## 2018-02-19 MED ORDER — SODIUM CHLORIDE 0.9 % IV SOLN
1.5000 g | Freq: Two times a day (BID) | INTRAVENOUS | Status: AC
  Administered 2018-02-19 – 2018-02-21 (×4): 1.5 g via INTRAVENOUS
  Filled 2018-02-19 (×4): qty 1.5

## 2018-02-19 MED ORDER — ORAL CARE MOUTH RINSE
15.0000 mL | OROMUCOSAL | Status: DC
  Administered 2018-02-20: 15 mL via OROMUCOSAL

## 2018-02-19 MED ORDER — BISACODYL 5 MG PO TBEC
10.0000 mg | DELAYED_RELEASE_TABLET | Freq: Every day | ORAL | Status: DC
  Administered 2018-02-20 – 2018-02-22 (×3): 10 mg via ORAL
  Filled 2018-02-19 (×3): qty 2

## 2018-02-19 MED ORDER — FENTANYL CITRATE (PF) 250 MCG/5ML IJ SOLN
INTRAMUSCULAR | Status: AC
  Filled 2018-02-19: qty 5

## 2018-02-19 MED ORDER — HEPARIN SODIUM (PORCINE) 1000 UNIT/ML IJ SOLN
INTRAMUSCULAR | Status: DC | PRN
  Administered 2018-02-19: 32 mL via INTRAVENOUS

## 2018-02-19 MED ORDER — PLASMA-LYTE 148 IV SOLN
INTRAVENOUS | Status: DC | PRN
  Administered 2018-02-19: 500 mL

## 2018-02-19 MED ORDER — HEMOSTATIC AGENTS (NO CHARGE) OPTIME
TOPICAL | Status: DC | PRN
  Administered 2018-02-19: 1
  Administered 2018-02-19: 2
  Administered 2018-02-19: 3

## 2018-02-19 MED ORDER — SODIUM CHLORIDE 0.9% FLUSH: 3.0000 mL | INTRAVENOUS | Status: DC | PRN

## 2018-02-19 MED ORDER — KENNESTONE BLOOD CARDIOPLEGIA VIAL
13.0000 mL | Freq: Once | Status: DC
  Filled 2018-02-19: qty 13

## 2018-02-19 MED ORDER — DOCUSATE SODIUM 100 MG PO CAPS
200.0000 mg | ORAL_CAPSULE | Freq: Every day | ORAL | Status: DC
  Administered 2018-02-20 – 2018-02-22 (×3): 200 mg via ORAL
  Filled 2018-02-19 (×3): qty 2

## 2018-02-19 MED ORDER — MORPHINE SULFATE (PF) 2 MG/ML IV SOLN
1.0000 mg | INTRAVENOUS | Status: AC | PRN
  Filled 2018-02-19: qty 2

## 2018-02-19 MED ORDER — ACETAMINOPHEN 160 MG/5ML PO SOLN: 650.0000 mg | Freq: Once | ORAL | Status: AC

## 2018-02-19 MED ORDER — MIDAZOLAM HCL 2 MG/2ML IJ SOLN
2.0000 mg | INTRAMUSCULAR | Status: DC | PRN
  Administered 2018-02-19 (×3): 2 mg via INTRAVENOUS
  Filled 2018-02-19 (×3): qty 2

## 2018-02-19 MED ORDER — OXYCODONE HCL 5 MG/5ML PO SOLN
5.0000 mg | Freq: Once | ORAL | Status: DC | PRN
Start: 2018-02-19 — End: 2018-02-19

## 2018-02-19 MED ORDER — THROMBIN 5000 UNITS EX SOLR
CUTANEOUS | Status: AC
  Filled 2018-02-19: qty 20000

## 2018-02-19 MED ORDER — DEXMEDETOMIDINE HCL IN NACL 400 MCG/100ML IV SOLN
0.0000 ug/kg/h | INTRAVENOUS | Status: DC
  Administered 2018-02-19 (×2): 0.7 ug/kg/h via INTRAVENOUS
  Filled 2018-02-19: qty 100

## 2018-02-19 MED ORDER — SODIUM CHLORIDE 0.9 % IR SOLN
Status: DC | PRN
  Administered 2018-02-19: 5000 mL

## 2018-02-19 MED ORDER — CHLORHEXIDINE GLUCONATE CLOTH 2 % EX PADS
6.0000 | MEDICATED_PAD | Freq: Every day | CUTANEOUS | Status: DC
  Administered 2018-02-19 – 2018-02-21 (×3): 6 via TOPICAL

## 2018-02-19 MED ORDER — THROMBIN 20000 UNITS EX SOLR
CUTANEOUS | Status: DC | PRN
  Administered 2018-02-19: 20000 [IU] via TOPICAL

## 2018-02-19 MED ORDER — NITROGLYCERIN IN D5W 200-5 MCG/ML-% IV SOLN
INTRAVENOUS | Status: DC | PRN
  Administered 2018-02-19: 16.6 ug/min via INTRAVENOUS

## 2018-02-19 MED ORDER — BISACODYL 10 MG RE SUPP: 10.0000 mg | Freq: Every day | RECTAL | Status: DC

## 2018-02-19 SURGICAL SUPPLY — 132 items
ADAPTER CARDIO PERF ANTE/RETRO (ADAPTER) ×3 IMPLANT
APPLICATOR TIP COSEAL (VASCULAR PRODUCTS) ×6 IMPLANT
ATTRACTOMAT 16X20 MAGNETIC DRP (DRAPES) ×3 IMPLANT
BAG DECANTER FOR FLEXI CONT (MISCELLANEOUS) ×3 IMPLANT
BANDAGE ACE 4X5 VEL STRL LF (GAUZE/BANDAGES/DRESSINGS) ×3 IMPLANT
BANDAGE ACE 6X5 VEL STRL LF (GAUZE/BANDAGES/DRESSINGS) ×3 IMPLANT
BASKET HEART (ORDER IN 25'S) (MISCELLANEOUS) ×1
BASKET HEART (ORDER IN 25S) (MISCELLANEOUS) ×2 IMPLANT
BLADE STERNUM SYSTEM 6 (BLADE) ×3 IMPLANT
BLADE SURG 15 STRL LF DISP TIS (BLADE) ×2 IMPLANT
BLADE SURG 15 STRL SS (BLADE) ×1
BNDG GAUZE ELAST 4 BULKY (GAUZE/BANDAGES/DRESSINGS) ×3 IMPLANT
CANISTER SUCT 3000ML PPV (MISCELLANEOUS) ×3 IMPLANT
CANNULA GUNDRY RCSP 15FR (MISCELLANEOUS) ×3 IMPLANT
CANNULA SUMP PERICARDIAL (CANNULA) ×3 IMPLANT
CATH HEART VENT LEFT (CATHETERS) ×2 IMPLANT
CATH ROBINSON RED A/P 18FR (CATHETERS) ×9 IMPLANT
CATH THORACIC 28FR (CATHETERS) ×3 IMPLANT
CATH THORACIC 36FR (CATHETERS) ×3 IMPLANT
CATH THORACIC 36FR RT ANG (CATHETERS) ×3 IMPLANT
CAUTERY HIGH TEMP VAS (MISCELLANEOUS) ×3 IMPLANT
CLIP VESOCCLUDE MED 24/CT (CLIP) IMPLANT
CLIP VESOCCLUDE SM WIDE 24/CT (CLIP) ×3 IMPLANT
CONN Y 3/8X3/8X3/8  BEN (MISCELLANEOUS) ×1
CONN Y 3/8X3/8X3/8 BEN (MISCELLANEOUS) ×2 IMPLANT
CONT SPEC 4OZ CLIKSEAL STRL BL (MISCELLANEOUS) ×6 IMPLANT
COVER MAYO STAND STRL (DRAPES) ×3 IMPLANT
COVER SURGICAL LIGHT HANDLE (MISCELLANEOUS) ×6 IMPLANT
CRADLE DONUT ADULT HEAD (MISCELLANEOUS) ×3 IMPLANT
DRAPE CARDIOVASCULAR INCISE (DRAPES) ×1
DRAPE SLUSH/WARMER DISC (DRAPES) ×3 IMPLANT
DRAPE SRG 135X102X78XABS (DRAPES) ×2 IMPLANT
DRSG AQUACEL AG ADV 3.5X14 (GAUZE/BANDAGES/DRESSINGS) ×3 IMPLANT
DRSG COVADERM 4X14 (GAUZE/BANDAGES/DRESSINGS) ×3 IMPLANT
DRSG TEGADERM 4X4.75 (GAUZE/BANDAGES/DRESSINGS) ×3 IMPLANT
ELECT CAUTERY BLADE 6.4 (BLADE) ×3 IMPLANT
ELECT REM PT RETURN 9FT ADLT (ELECTROSURGICAL) ×6
ELECTRODE REM PT RTRN 9FT ADLT (ELECTROSURGICAL) ×4 IMPLANT
FELT TEFLON 1X6 (MISCELLANEOUS) ×6 IMPLANT
GAUZE SPONGE 4X4 12PLY STRL (GAUZE/BANDAGES/DRESSINGS) ×3 IMPLANT
GLOVE BIO SURGEON STRL SZ 6 (GLOVE) IMPLANT
GLOVE BIO SURGEON STRL SZ 6.5 (GLOVE) IMPLANT
GLOVE BIO SURGEON STRL SZ7 (GLOVE) IMPLANT
GLOVE BIO SURGEON STRL SZ7.5 (GLOVE) IMPLANT
GLOVE BIOGEL PI IND STRL 6 (GLOVE) IMPLANT
GLOVE BIOGEL PI IND STRL 6.5 (GLOVE) IMPLANT
GLOVE BIOGEL PI IND STRL 7.0 (GLOVE) IMPLANT
GLOVE BIOGEL PI INDICATOR 6 (GLOVE)
GLOVE BIOGEL PI INDICATOR 6.5 (GLOVE)
GLOVE BIOGEL PI INDICATOR 7.0 (GLOVE)
GLOVE EUDERMIC 7 POWDERFREE (GLOVE) ×6 IMPLANT
GLOVE ORTHO TXT STRL SZ7.5 (GLOVE) IMPLANT
GOWN STRL REUS W/ TWL LRG LVL3 (GOWN DISPOSABLE) ×8 IMPLANT
GOWN STRL REUS W/ TWL XL LVL3 (GOWN DISPOSABLE) ×2 IMPLANT
GOWN STRL REUS W/TWL LRG LVL3 (GOWN DISPOSABLE) ×4
GOWN STRL REUS W/TWL XL LVL3 (GOWN DISPOSABLE) ×1
GRAFT HEMASHIELD 8MM (Vascular Products) ×1 IMPLANT
GRAFT VASC STRG 30X8KNIT (Vascular Products) ×2 IMPLANT
GRAFT WOVEN D/V 30DX30L (Vascular Products) ×3 IMPLANT
HEMOSTAT ARISTA ABSORB 3G PWDR (MISCELLANEOUS) ×9 IMPLANT
HEMOSTAT POWDER SURGIFOAM 1G (HEMOSTASIS) ×9 IMPLANT
HEMOSTAT SURGICEL 2X14 (HEMOSTASIS) ×6 IMPLANT
INSERT FOGARTY 61MM (MISCELLANEOUS) IMPLANT
INSERT FOGARTY XLG (MISCELLANEOUS) IMPLANT
KIT BASIN OR (CUSTOM PROCEDURE TRAY) ×3 IMPLANT
KIT CATH CPB BARTLE (MISCELLANEOUS) ×3 IMPLANT
KIT SUCTION CATH 14FR (SUCTIONS) ×3 IMPLANT
KIT TURNOVER KIT B (KITS) ×3 IMPLANT
KIT VASOVIEW HEMOPRO VH 3000 (KITS) ×3 IMPLANT
LINE VENT (MISCELLANEOUS) ×3 IMPLANT
LOOP VESSEL MAXI BLUE (MISCELLANEOUS) ×9 IMPLANT
NS IRRIG 1000ML POUR BTL (IV SOLUTION) ×15 IMPLANT
PACK E OPEN HEART (SUTURE) ×3 IMPLANT
PACK OPEN HEART (CUSTOM PROCEDURE TRAY) ×3 IMPLANT
PAD ARMBOARD 7.5X6 YLW CONV (MISCELLANEOUS) ×6 IMPLANT
PAD ELECT DEFIB RADIOL ZOLL (MISCELLANEOUS) ×3 IMPLANT
PENCIL BUTTON HOLSTER BLD 10FT (ELECTRODE) ×3 IMPLANT
PUNCH AORTIC ROTATE 4.0MM (MISCELLANEOUS) IMPLANT
PUNCH AORTIC ROTATE 4.5MM 8IN (MISCELLANEOUS) ×3 IMPLANT
PUNCH AORTIC ROTATE 5MM 8IN (MISCELLANEOUS) IMPLANT
SEALANT SURG COSEAL 8ML (VASCULAR PRODUCTS) ×6 IMPLANT
SENSOR MYOCARDIAL TEMP (MISCELLANEOUS) ×6 IMPLANT
SET CARDIOPLEGIA MPS 5001102 (MISCELLANEOUS) ×3 IMPLANT
SPONGE INTESTINAL PEANUT (DISPOSABLE) IMPLANT
SPONGE LAP 18X18 X RAY DECT (DISPOSABLE) IMPLANT
SPONGE LAP 4X18 RFD (DISPOSABLE) ×3 IMPLANT
SUT BONE WAX W31G (SUTURE) ×3 IMPLANT
SUT ETHIBON 2 0 V 52N 30 (SUTURE) ×6 IMPLANT
SUT ETHIBON EXCEL 2-0 V-5 (SUTURE) IMPLANT
SUT ETHIBOND V-5 VALVE (SUTURE) IMPLANT
SUT MNCRL AB 4-0 PS2 18 (SUTURE) IMPLANT
SUT PROLENE 3 0 SH 1 (SUTURE) ×3 IMPLANT
SUT PROLENE 3 0 SH 48 (SUTURE) ×6 IMPLANT
SUT PROLENE 3 0 SH DA (SUTURE) ×6 IMPLANT
SUT PROLENE 3 0 SH1 36 (SUTURE) ×3 IMPLANT
SUT PROLENE 4 0 RB 1 (SUTURE) ×4
SUT PROLENE 4 0 SH DA (SUTURE) IMPLANT
SUT PROLENE 4-0 RB1 .5 CRCL 36 (SUTURE) ×8 IMPLANT
SUT PROLENE 5 0 C 1 36 (SUTURE) IMPLANT
SUT PROLENE 5 0 RB 2 (SUTURE) ×6 IMPLANT
SUT PROLENE 6 0 C 1 30 (SUTURE) IMPLANT
SUT PROLENE 7 0 BV 1 (SUTURE) IMPLANT
SUT PROLENE 7 0 BV1 MDA (SUTURE) ×6 IMPLANT
SUT PROLENE 8 0 BV175 6 (SUTURE) ×6 IMPLANT
SUT SILK  1 MH (SUTURE)
SUT SILK 1 MH (SUTURE) IMPLANT
SUT SILK 2 0 SH CR/8 (SUTURE) ×3 IMPLANT
SUT STEEL 6MS V (SUTURE) IMPLANT
SUT STEEL STERNAL CCS#1 18IN (SUTURE) IMPLANT
SUT STEEL SZ 6 DBL 3X14 BALL (SUTURE) ×9 IMPLANT
SUT VIC AB 1 CTX 36 (SUTURE) ×2
SUT VIC AB 1 CTX36XBRD ANBCTR (SUTURE) ×4 IMPLANT
SUT VIC AB 2-0 CT1 27 (SUTURE) ×4
SUT VIC AB 2-0 CT1 TAPERPNT 27 (SUTURE) ×8 IMPLANT
SUT VIC AB 2-0 CTX 27 (SUTURE) IMPLANT
SUT VIC AB 3-0 SH 27 (SUTURE)
SUT VIC AB 3-0 SH 27X BRD (SUTURE) IMPLANT
SUT VIC AB 3-0 X1 27 (SUTURE) ×12 IMPLANT
SUT VICRYL 4-0 PS2 18IN ABS (SUTURE) IMPLANT
SYSTEM SAHARA CHEST DRAIN ATS (WOUND CARE) ×3 IMPLANT
TAPE CLOTH SURG 4X10 WHT LF (GAUZE/BANDAGES/DRESSINGS) ×3 IMPLANT
TOWEL GREEN STERILE (TOWEL DISPOSABLE) ×3 IMPLANT
TOWEL GREEN STERILE FF (TOWEL DISPOSABLE) ×3 IMPLANT
TRAY FOLEY SLVR 14FR TEMP STAT (SET/KITS/TRAYS/PACK) ×3 IMPLANT
TRAY FOLEY SLVR 16FR TEMP STAT (SET/KITS/TRAYS/PACK) ×3 IMPLANT
TUBE CONNECTING 20X1/4 (TUBING) ×3 IMPLANT
TUBING INSUFFLATION (TUBING) ×3 IMPLANT
UNDERPAD 30X30 (UNDERPADS AND DIAPERS) ×3 IMPLANT
VALVE AORTIC SZ25 INSP/RESIL (Prosthesis & Implant Heart) ×3 IMPLANT
VENT LEFT HEART 12002 (CATHETERS) ×3
WATER STERILE IRR 1000ML POUR (IV SOLUTION) ×6 IMPLANT
YANKAUER SUCT BULB TIP NO VENT (SUCTIONS) ×3 IMPLANT

## 2018-02-19 NOTE — Anesthesia Preprocedure Evaluation (Signed)
Anesthesia Evaluation  Patient identified by MRN, date of birth, ID band Patient awake    Reviewed: Allergy & Precautions, NPO status , Patient's Chart, lab work & pertinent test results  History of Anesthesia Complications (+) PONV and history of anesthetic complications  Airway Mallampati: II  TM Distance: >3 FB Neck ROM: Full    Dental   Pulmonary pneumonia, Current Smoker, former smoker,    Pulmonary exam normal breath sounds clear to auscultation       Cardiovascular + CAD and + Peripheral Vascular Disease  Normal cardiovascular exam Rhythm:Regular Rate:Normal     Neuro/Psych negative neurological ROS  negative psych ROS   GI/Hepatic Neg liver ROS, hiatal hernia, GERD  ,  Endo/Other  negative endocrine ROS  Renal/GU negative Renal ROS  negative genitourinary   Musculoskeletal negative musculoskeletal ROS (+)   Abdominal   Peds negative pediatric ROS (+)  Hematology negative hematology ROS (+)   Anesthesia Other Findings   Reproductive/Obstetrics negative OB ROS                             Anesthesia Physical  Anesthesia Plan  ASA: IV  Anesthesia Plan: General   Post-op Pain Management:    Induction: Intravenous  PONV Risk Score and Plan: Treatment may vary due to age or medical condition  Airway Management Planned: Oral ETT  Additional Equipment: Arterial line, CVP, PA Cath, TEE, 3D TEE and Ultrasound Guidance Line Placement  Intra-op Plan:   Post-operative Plan: Post-operative intubation/ventilation  Informed Consent: I have reviewed the patients History and Physical, chart, labs and discussed the procedure including the risks, benefits and alternatives for the proposed anesthesia with the patient or authorized representative who has indicated his/her understanding and acceptance.   Dental advisory given  Plan Discussed with: CRNA  Anesthesia Plan Comments: (Per  Ryan RN at [facility], Dr. Laneta Simmers requests right axillary cannulation and needs bilateral radial arterial lines. Shonna Chock, PA-C )        Anesthesia Quick Evaluation                                  Anesthesia Evaluation  Patient identified by MRN, date of birth, ID band Patient awake    Reviewed: Allergy & Precautions, NPO status , Patient's Chart, lab work & pertinent test results  History of Anesthesia Complications (+) PONV and history of anesthetic complications  Airway        Dental   Pulmonary pneumonia, Current Smoker, former smoker,           Cardiovascular + CAD and + Peripheral Vascular Disease       Neuro/Psych negative neurological ROS  negative psych ROS   GI/Hepatic Neg liver ROS, hiatal hernia, GERD  ,  Endo/Other  negative endocrine ROS  Renal/GU negative Renal ROS  negative genitourinary   Musculoskeletal negative musculoskeletal ROS (+)   Abdominal   Peds negative pediatric ROS (+)  Hematology negative hematology ROS (+)   Anesthesia Other Findings   Reproductive/Obstetrics negative OB ROS                            Anesthesia Physical Anesthesia Plan  ASA: IV  Anesthesia Plan: General   Post-op Pain Management:    Induction: Intravenous  PONV Risk Score and Plan: Treatment may vary due to age or medical  condition  Airway Management Planned: Oral ETT  Additional Equipment: Arterial line, CVP, PA Cath, TEE, 3D TEE and Ultrasound Guidance Line Placement  Intra-op Plan:   Post-operative Plan: Post-operative intubation/ventilation  Informed Consent: I have reviewed the patients History and Physical, chart, labs and discussed the procedure including the risks, benefits and alternatives for the proposed anesthesia with the patient or authorized representative who has indicated his/her understanding and acceptance.   Dental advisory given  Plan Discussed with: CRNA  Anesthesia Plan  Comments: (Per Ryan RN at [facility], Dr. Laneta Simmers requests right axillary cannulation and needs bilateral radial arterial lines. Shonna Chock, PA-C )       Anesthesia Quick Evaluation                                  Anesthesia Evaluation  Patient identified by MRN, date of birth, ID band Patient awake    Reviewed: Allergy & Precautions, NPO status , Patient's Chart, lab work & pertinent test results  Airway Mallampati: II  TM Distance: >3 FB Neck ROM: Full    Dental no notable dental hx. (+) Edentulous Upper, Poor Dentition   Pulmonary neg pulmonary ROS, Current Smoker,    Pulmonary exam normal breath sounds clear to auscultation       Cardiovascular + CAD  negative cardio ROS Normal cardiovascular exam Rhythm:Regular Rate:Normal + Systolic murmurs    Neuro/Psych negative neurological ROS  negative psych ROS   GI/Hepatic negative GI ROS, Neg liver ROS, GERD  ,  Endo/Other  negative endocrine ROS  Renal/GU negative Renal ROS  negative genitourinary   Musculoskeletal negative musculoskeletal ROS (+)   Abdominal   Peds negative pediatric ROS (+)  Hematology negative hematology ROS (+)   Anesthesia Other Findings   Reproductive/Obstetrics negative OB ROS                            Anesthesia Physical Anesthesia Plan  ASA: III  Anesthesia Plan: General   Post-op Pain Management:    Induction: Intravenous, Rapid sequence and Cricoid pressure planned  PONV Risk Score and Plan: 1 and Ondansetron and Treatment may vary due to age or medical condition  Airway Management Planned: Oral ETT  Additional Equipment:   Intra-op Plan:   Post-operative Plan: Extubation in OR  Informed Consent: I have reviewed the patients History and Physical, chart, labs and discussed the procedure including the risks, benefits and alternatives for the proposed anesthesia with the patient or authorized representative who has indicated  his/her understanding and acceptance.   Dental advisory given  Plan Discussed with: CRNA  Anesthesia Plan Comments:         Anesthesia Quick Evaluation                                    Anesthesia Evaluation  Patient identified by MRN, date of birth, ID band Patient awake    Reviewed: Allergy & Precautions, NPO status , Patient's Chart, lab work & pertinent test results  Airway Mallampati: II  TM Distance: >3 FB Neck ROM: Full    Dental no notable dental hx. (+) Edentulous Upper, Poor Dentition   Pulmonary neg pulmonary ROS, Current Smoker,    Pulmonary exam normal breath sounds clear to auscultation       Cardiovascular + CAD  negative cardio  ROS Normal cardiovascular exam Rhythm:Regular Rate:Normal + Systolic murmurs    Neuro/Psych negative neurological ROS  negative psych ROS   GI/Hepatic negative GI ROS, Neg liver ROS, GERD  ,  Endo/Other  negative endocrine ROS  Renal/GU negative Renal ROS  negative genitourinary   Musculoskeletal negative musculoskeletal ROS (+)   Abdominal   Peds negative pediatric ROS (+)  Hematology negative hematology ROS (+)   Anesthesia Other Findings   Reproductive/Obstetrics negative OB ROS                            Anesthesia Physical Anesthesia Plan  ASA: III  Anesthesia Plan: General   Post-op Pain Management:    Induction: Intravenous, Rapid sequence and Cricoid pressure planned  PONV Risk Score and Plan: 1 and Ondansetron and Treatment may vary due to age or medical condition  Airway Management Planned: Oral ETT  Additional Equipment:   Intra-op Plan:   Post-operative Plan: Extubation in OR  Informed Consent: I have reviewed the patients History and Physical, chart, labs and discussed the procedure including the risks, benefits and alternatives for the proposed anesthesia with the patient or authorized representative who has indicated his/her understanding  and acceptance.   Dental advisory given  Plan Discussed with: CRNA  Anesthesia Plan Comments:         Anesthesia Quick Evaluation

## 2018-02-19 NOTE — Anesthesia Procedure Notes (Signed)
Procedure Name: Intubation Date/Time: 02/19/2018 8:03 AM Performed by: Malyiah Fellows T, CRNA Pre-anesthesia Checklist: Patient identified, Emergency Drugs available, Suction available and Patient being monitored Patient Re-evaluated:Patient Re-evaluated prior to induction Oxygen Delivery Method: Circle system utilized Preoxygenation: Pre-oxygenation with 100% oxygen Induction Type: IV induction Ventilation: Mask ventilation without difficulty and Oral airway inserted - appropriate to patient size Laryngoscope Size: Miller and 3 Grade View: Grade I Tube type: Subglottic suction tube Tube size: 8.0 mm Number of attempts: 1 Airway Equipment and Method: Patient positioned with wedge pillow and Stylet Placement Confirmation: ETT inserted through vocal cords under direct vision,  breath sounds checked- equal and bilateral and positive ETCO2 Secured at: 22 cm Tube secured with: Tape Dental Injury: Teeth and Oropharynx as per pre-operative assessment

## 2018-02-19 NOTE — Brief Op Note (Signed)
02/18/2018 - 02/19/2018  7:56 AM  PATIENT:  Tyler Kemp  57 y.o. male  PRE-OPERATIVE DIAGNOSIS:  AS CAD TAA  POST-OPERATIVE DIAGNOSIS:  * No post-op diagnosis entered *  PROCEDURE:  Procedure(s) with comments: CORONARY ARTERY BYPASS GRAFTING (CABG)x3. LIMA TO LAD. SVG TO PL. SVG to OM. SAPHENOUS VEIN HARVEST. (N/A) BENTALL PROCEDURE (N/A) - RIGHT AXILLARY CANNULATION  CIRC ARREST  BILATERAL RADIAL ARTERIAL LINES REPLACEMENT ASCENDING AORTA (N/A) TRANSESOPHAGEAL ECHOCARDIOGRAM (TEE) (N/A)  SURGEON:  Surgeon(s) and Role:    * Bartle, Payton DoughtyBryan K, MD - Primary  PHYSICIAN ASSISTANT: WAYNE GOLD PA-C  ANESTHESIA:   general  EBL:  1500 mL   BLOOD ADMINISTERED:see anesthesia and perfusion records  DRAINS: PLEURAL AND PERICARDIAL CHEST DRAINS   LOCAL MEDICATIONS USED:  NONE  SPECIMEN:  Source of Specimen:  AV LEAFLETS AND ANEURYSM  DISPOSITION OF SPECIMEN:  PATHOLOGY  COUNTS:  YES  TOURNIQUET:  * No tourniquets in log *  DICTATION: .Dragon Dictation  PLAN OF CARE: Admit to inpatient   PATIENT DISPOSITION:  ICU - intubated and hemodynamically stable.   Delay start of Pharmacological VTE agent (>24hrs) due to surgical blood loss or risk of bleeding: yes  COMPLICATIONS: NO KNOWN

## 2018-02-19 NOTE — Transfer of Care (Signed)
Immediate Anesthesia Transfer of Care Note  Patient: Tyler Kemp  Procedure(s) Performed: CORONARY ARTERY BYPASS GRAFTING (CABG)x3. LIMA TO LAD. SVG TO PL. SVG to OM. SAPHENOUS VEIN HARVEST. (N/A Chest) BENTALL PROCEDURE (N/A Chest) REPLACEMENT ASCENDING AORTA (N/A ) TRANSESOPHAGEAL ECHOCARDIOGRAM (TEE) (N/A )  Patient Location: ICU  Anesthesia Type:General  Level of Consciousness: Patient remains intubated per anesthesia plan  Airway & Oxygen Therapy: Patient remains intubated per anesthesia plan and Patient placed on Ventilator (see vital sign flow sheet for setting)  Post-op Assessment: Report given to RN and Post -op Vital signs reviewed and stable  Post vital signs: Reviewed and stable  Last Vitals:  Vitals Value Taken Time  BP    Temp 35.7 C 02/19/2018  6:57 PM  Pulse 87 02/19/2018  6:57 PM  Resp 16 02/19/2018  6:57 PM  SpO2 100 % 02/19/2018  6:57 PM  Vitals shown include unvalidated device data.  Last Pain:  Vitals:   02/19/18 1845  TempSrc: Core  PainSc:          Complications: No apparent anesthesia complications

## 2018-02-19 NOTE — Op Note (Signed)
CARDIOVASCULAR SURGERY OPERATIVE NOTE  02/19/2018  Surgeon:  Alleen Borne, MD  First Assistant: Gershon Crane,  PA-C. Vein harvesting and first assisting for the entire procedure until cross clamp removed.    Preoperative Diagnosis:  Severe 3-vessel coronary artery disease, severe aortic stenosis, 4.7 cm ascending aortic aneurysm.   Postoperative Diagnosis:  Same   Procedure:  1. Median Sternotomy 2. Extracorporeal circulation 3.   Replacement of the ascending aorta (hemi-arch) using a 30 mm Hemashield graft under deep hypothermic circulatory arrest 4.   Aortic valve replacement using a 25 mm INSPIRIS RESILIA aortic valve 5.   Wheat procedure (supra-coronary aortic anastomosis). 6.   Coronary artery bypass graft surgery x 3   Left internal mammary artery graft to the LAD  SVG to OM  SVG to PL (RCA)  7.   Open harvest of left greater saphenous vein   Anesthesia:  General Endotracheal   Clinical History/Surgical Indication:  This 57 year old gentleman has stage D, severe, symptomatic aortic stenosis with New York Heart Association class II symptoms of exertional shortness of breath consistent with chronic diastolic congestive heart failure. He also has exertional chest discomfort and is starting to have some episodes of dizziness. I have personally reviewed his echocardiogram, cardiac catheterization, and CTA of the chest. His echocardiogram shows a trileaflet aortic valve with severe calcification and a mean gradient of 51 mmHg consistent with severe aortic stenosis. His cardiac catheterization shows moderate proximal and distal LAD stenosis as well as high-grade stenoses in the LAD, obtuse marginal, and posterior lateral branch of the right coronary artery. The CTA of the chest shows dilation of the aortic root and ascending aorta to 4.7 cm. The aorta does not return to a more normal size until after the innominate artery. 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 I  think the best option for treating this patient is coronary bypass graft surgery as well as Bentall procedure to replace the aortic valve and aortic root with replacement of the ascending aorta and proximal aortic arch to a point between the innominate artery and left common carotid artery. He would require a bypass from the ascending aortic graft up to the innominate artery. This would require right axillary artery cannulation for circulatory arrest. I reviewed the echo and cath findings with him as well as the CTA images. I discussed the alternatives of mechanical and bioprosthetic valves. Both he and his daughter-in-law feel that a bioprosthetic valve would be the best option for him given his living situation and alcohol use. I think that is a reasonable option for him and will have less risk over the long-term. He understands that there is an increased risk of structural valve deterioration with a bioprosthetic valve and accepts that risk. He has bad varicose veins in the right lower extremity so vein harvesting there would not be an option. He does not have any varicose veins in the left lower extremity and hopefully that vein will be suitable along with a left internal mammary graft. I discussed the operative procedure with the patient and his daughter-in-law including alternatives, benefits and risks; including but not limited to bleeding, blood transfusion, infection, stroke, myocardial infarction, graft failure, heart block requiring a permanent pacemaker, organ dysfunction, and death. [name] understands and agrees to proceed.     Preparation:  The patient was seen in the preoperative holding area and the correct patient, correct operation were confirmed with the patient after reviewing the medical record and catheterization. The consent was signed by  me. Preoperative antibiotics were given. A pulmonary arterial line and radial arterial line were placed by the anesthesia team. The patient was  taken back to the operating room and positioned supine on the operating room table. After being placed under general endotracheal anesthesia by the anesthesia team a foley catheter was placed. The neck, chest, abdomen, and both legs were prepped with betadine soap and solution and draped in the usual sterile manner. A surgical time-out was taken and the correct patient and operative procedure were confirmed with the nursing and anesthesia staff.  TEE:  Performed by Jolyne Loa, MD. This showed good LV systolic function. There was severe AS with a mean gradient over 90 mm Hg in some views. Trivial MR, severe mitral annular calcification.                                                                      Median Sternotomy:  A median sternotomy was performed. The pericardium was opened in the midline. Right ventricular function appeared normal. The ascending aorta was aneursymal and thin walled and had no palpable plaque.   Left internal mammary harvest:  The left side of the sternum was retracted using the Rultract retractor. The left internal mammary artery was harvested as a pedicle graft. All side branches were clipped. It was a medium-sized vessel of good quality with excellent blood flow. It was ligated distally and divided. It was sprayed with topical papaverine solution to prevent vasospasm.   Vein harvest:  The left greater saphenous vein was harvested in an open manner because it was very superficial and delicate.  It was harvested from the upper thigh to below the knee using short bridging incisions.  It was a medium-sized vein of good quality. The side branches were all ligated with 4-0 silk ties. The right leg had a large varicose saphenous vein that was not suitable for conduit.  Right axillary artery exposure and cannulation:  A transverse incision was made below the right clavicle. The pectoralis major muscle was split along its fibers and the pectoralis minor muscle was retracted  laterally. The brachial plexus was identified and gently retracted laterally to expose the axillary artery. The artery was controlled proximally and distally with vessel loops. The patient was fully heparinized and ACT maintained greater than 400. The axillary artery was clamped proximally and distally with peripheral Debakey clamps. It was opened longitudinally. There was no sign of dissection here. An 8 mm Hemashield dacron graft was anastomosed in an end to side manner using continuous 5-0 prolene suture. CoSeal was applied for hemostasis and the clamp removed. The graft was connected to the arterial end of the bypass circuit. Venous cannulation was performed via the right atrial appendage using a two-staged venous cannula.      Resection and grafting of ascending aortic aneurysm:  The patient was placed on cardiopulmonary bypass and a left ventricular vent was placed via the right superior pulmonary vein. Systemic cooling was begun with a goal temperature of 18 degrees centigrade by bladder and rectal temperature probes. A retrograde cardioplegia cannula was placed through the right atrium into the coronary sinus without difficulty. A temperature probe was placed in the septum and an insulating pad in the pericardium. After 30 minutes of  cooling the target temperature of 18 degrees centigrade was reached. Cerebral oximetry was 70% bilaterally. BIS was zero. The patient was given Propofol and 125 mg of Solumedrol. The head was packed in ice. The bed was placed in steep trendelenburg. Circulatory arrest was begun and the blood volume emptied into the venous reservoir. The innominate artery was clamped.  Antegrade cerebral perfusion was begun through the right axillary artery.   Cold blood retrograde cardioplegia was given by the [facility] protocol and myocardial temperature dropped to 10 degrees centigrade. Additional doses were given at approximately 30 minute intervals throughout the period of circulatory  arrest and cross-clamping. Complete diastolic arrest was maintained.  The aorta was transected just proximal to the innominate artery beveling the resection out along the undersurface of the aortic arch (Hemiarch replacement). A 30 mm Hemasheild Platinum vascular graft was prepared. ( Catalog # [id number] P0, Lot [id number], SN [id number]). It was anastomosed to the aortic arch in an end to end manner using 3-0 prolene continuous suture with a felt strip to reinforce the anastomisis. A light coating of CoSeal was applied to seal needle holes. The clamp was removed from the innominate artery and circulation was slowly resumed. The aortic graft was cross-clamped proximal to the anastomosis and full CPB support was resumed. Circulatory arrest time was 26 minutes. Antegrade cerebral perfusion time was 26 minutes.   Coronary arteries:  The coronary arteries were examined.   LAD:  Intramyocardial throughout. Located in the mid portion where it was a medium sized vessel with thickened walls.  LCX:  Medium sized OM1 with no distal disease. OM2 was small where I could see it and not graft-able  RCA:  Medium sized PL branch with no distal disease.   Grafts:  1. LIMA to the LAD: 1.75 mm. It was sewn end to side using 8-0 prolene continuous suture. 2. SVG to OM1:  1.6 mm. It was sewn end to side using 7-0 prolene continuous suture. 3. SVG to PL:  1.6 mm. It was sewn end to side using 7-0 prolene continuous suture.   The proximal vein graft anastomoses were performed to the aortic graft using continuous 6-0 prolene suture.    Aortic valve replacement.   The ascending aorta was mobilized from the right pulmonary artery and main PA. It was opened longitudinally and the valve inspected. It was a bicuspid valve with severe leaflet calcification and severe annular calcification that extended down into the LVOT and into the anterior mitral leaflet. There was a small central opening.  There was calcification  around the ostium of the left main. The native valve was excised taking care to remove all particulate debri. The annulus was decalcified with rongeurs. This took over an hour to debride. I was concerned about the integrity of the annulus anteriorly and the calcification extending into the mitral annulus and felt that for multiple reasons a Wheat procedure would be a better option for this patient than a Bentall procedure.  The annulus was sized and a 25 mm Edwards INSPIRIS RESILIA pericardial valve was chosen. ( Model # [id number], Serial # [id number]). A series of pledgetted 2-0 Ethibond horizontal mattress sutures were placed around the annulus with the pledgets in a sub-annular position. The sutures were placed through the valve sewing ring. The valve was lowered into place and the sutures tied . The valve seated nicely.  anastomosis for hemostasis. The aortic graft was then cut to the appropriate length and anastomosed end to end to the supra-coronary aorta using continuous  3-0 prolene suture with a felt strip for reinforcement.  CoSeal was applied to seal the needle holes in the grafts. A vent cannula was placed into the graft to remove any air. Deairing maneuvers were performed and the bed placed in trendelenburg position.   Completion:   The patient was rewarmed to 37 degrees Centigrade. The crossclamp was removed with a time of 225 minutes. There was spontaneous return of sinus rhythm. The position of the grafts was satisfactory. The vascular anastomoses all appeared hemostatic. Two temporary epicardial pacing wires were placed on the right atrium and two on the right ventricle. The patient was weaned from CPB without difficulty on renal dopamine at 3 mcg. CPB time was 295 minutes. Cardiac output was 5 LPM. TEE showed a normal functioning aortic valve prosthesis with no AI. There was unchanged trivial MR. LV function appeared normal. Heparin was fully reversed with protamine and the venous cannula removed.  The axillary artery graft was ligated with a heavy silk tie and suture ligated with a 3-0 prolene pledgetted horizontal mattress suture. Hemostasis was achieved. The patient was coagulopathic with a platelet ct of 80K, low fibrinogen of 171 and was given a unit of platelets, a unit of cryo, and two units of FFP.  Mediastinal and left pleural drainage tubes were placed. The sternum was closed with double #6 stainless steel wires. The fascia was closed with continuous # 1 vicryl suture. The subcutaneous tissue was closed with 2-0 vicryl continuous suture. The skin was closed with 3-0 vicryl subcuticular suture. The axillary artery exposure incision was closed in layers in ta similar manner. All sponge, needle, and instrument counts were reported correct at the end of the case. Dry sterile dressings were placed over the incisions and around the chest tubes which were connected to pleurevac suction. The patient was then transported to the surgical intensive care unit in critical but stable condition.

## 2018-02-19 NOTE — Anesthesia Postprocedure Evaluation (Signed)
Anesthesia Post Note  Patient: Tyler RichterDavid Kemp  Procedure(s) Performed: CORONARY ARTERY BYPASS GRAFTING (CABG)x3. LIMA TO LAD. SVG TO PL. SVG to OM. SAPHENOUS VEIN HARVEST. (N/A Chest) BENTALL PROCEDURE (N/A Chest) REPLACEMENT ASCENDING AORTA (N/A ) TRANSESOPHAGEAL ECHOCARDIOGRAM (TEE) (N/A )     Patient location during evaluation: SICU Anesthesia Type: General Level of consciousness: sedated Pain management: pain level controlled Vital Signs Assessment: post-procedure vital signs reviewed and stable Respiratory status: patient remains intubated per anesthesia plan Cardiovascular status: stable Postop Assessment: no apparent nausea or vomiting Anesthetic complications: no    Last Vitals:  Vitals:   02/19/18 2010 02/19/18 2015  BP:    Pulse: 89 88  Resp: (!) 22 (!) 21  Temp:  (!) 35.9 C  SpO2: 100% 100%    Last Pain:  Vitals:   02/19/18 2015  TempSrc: Core  PainSc:                  Tyler Kemp,Tyler Kemp

## 2018-02-19 NOTE — Progress Notes (Signed)
[facility] BRIEF SICU PROGRESS NOTE  Day of Surgery  S/P Procedure(s) (LRB): CORONARY ARTERY BYPASS GRAFTING (CABG)x3. LIMA TO LAD. SVG TO PL. SVG to OM. SAPHENOUS VEIN HARVEST. (N/A) BENTALL PROCEDURE (N/A) REPLACEMENT ASCENDING AORTA (N/A) TRANSESOPHAGEAL ECHOCARDIOGRAM (TEE) (N/A)   Sedated on vent NSR w/ stable hemodynamics O2 sats 100% Chest tube output trending down w/ correction of coagulopathy, thrombocytopenia Excellent UOP Labs okay other than thrombocytopenia  Plan: Continue routine early postop  Tyler Nailslarence H Onix Jumper, MD 02/19/2018 8:40 PM

## 2018-02-19 NOTE — Progress Notes (Signed)
Patient interviewed in the pre-op area by Virgilio FreesKim Hardy, RN.

## 2018-02-19 NOTE — Progress Notes (Signed)
  Echocardiogram Echocardiogram Transesophageal has been performed.  Janalyn HarderWest, Heitor Steinhoff R 02/19/2018, 10:40 AM

## 2018-02-20 ENCOUNTER — Encounter (HOSPITAL_COMMUNITY): Payer: Self-pay | Admitting: Surgery

## 2018-02-20 ENCOUNTER — Inpatient Hospital Stay (HOSPITAL_COMMUNITY): Payer: BLUE CROSS/BLUE SHIELD

## 2018-02-20 LAB — POCT I-STAT 3, ART BLOOD GAS (G3+)
Acid-Base Excess: 1 mmol/L (ref 0.0–2.0)
Acid-base deficit: 3 mmol/L — ABNORMAL HIGH (ref 0.0–2.0)
BICARBONATE: 25 mmol/L (ref 20.0–28.0)
Bicarbonate: 23.6 mmol/L (ref 20.0–28.0)
Bicarbonate: 25.8 mmol/L (ref 20.0–28.0)
O2 SAT: 98 %
O2 Saturation: 99 %
O2 Saturation: 99 %
PCO2 ART: 41.4 mmHg (ref 32.0–48.0)
PCO2 ART: 44.5 mmHg (ref 32.0–48.0)
PH ART: 7.363 (ref 7.350–7.450)
PO2 ART: 130 mmHg — AB (ref 83.0–108.0)
PO2 ART: 137 mmHg — AB (ref 83.0–108.0)
Patient temperature: 35.7
Patient temperature: 37.7
Patient temperature: 37.9
TCO2: 26 mmol/L (ref 22–32)
TCO2: 27 mmol/L (ref 22–32)
TCO2: 25 mmol/L (ref 22–32)
pCO2 arterial: 44.3 mmHg (ref 32.0–48.0)
pH, Arterial: 7.329 — ABNORMAL LOW (ref 7.350–7.450)
pH, Arterial: 7.405 (ref 7.350–7.450)
pO2, Arterial: 101 mmHg (ref 83.0–108.0)

## 2018-02-20 LAB — GLUCOSE, CAPILLARY
GLUCOSE-CAPILLARY: 103 mg/dL — AB (ref 70–99)
GLUCOSE-CAPILLARY: 126 mg/dL — AB (ref 70–99)
GLUCOSE-CAPILLARY: 96 mg/dL (ref 70–99)
Glucose-Capillary: 102 mg/dL — ABNORMAL HIGH (ref 70–99)
Glucose-Capillary: 108 mg/dL — ABNORMAL HIGH (ref 70–99)
Glucose-Capillary: 109 mg/dL — ABNORMAL HIGH (ref 70–99)
Glucose-Capillary: 112 mg/dL — ABNORMAL HIGH (ref 70–99)
Glucose-Capillary: 115 mg/dL — ABNORMAL HIGH (ref 70–99)
Glucose-Capillary: 126 mg/dL — ABNORMAL HIGH (ref 70–99)
Glucose-Capillary: 131 mg/dL — ABNORMAL HIGH (ref 70–99)
Glucose-Capillary: 158 mg/dL — ABNORMAL HIGH (ref 70–99)
Glucose-Capillary: 98 mg/dL (ref 70–99)
Glucose-Capillary: 99 mg/dL (ref 70–99)

## 2018-02-20 LAB — BASIC METABOLIC PANEL
Anion gap: 6 (ref 5–15)
BUN: 11 mg/dL (ref 6–20)
CALCIUM: 7.9 mg/dL — AB (ref 8.9–10.3)
CHLORIDE: 110 mmol/L (ref 98–111)
CO2: 23 mmol/L (ref 22–32)
CREATININE: 0.76 mg/dL (ref 0.61–1.24)
GFR calc Af Amer: >60 mL/min (ref >60)
GFR calc non Af Amer: >60 mL/min (ref >60)
GLUCOSE: 112 mg/dL — AB (ref 70–99)
Potassium: 4.5 mmol/L (ref 3.5–5.1)
Sodium: 139 mmol/L (ref 135–145)

## 2018-02-20 LAB — BPAM FFP
BLOOD PRODUCT EXPIRATION DATE: 201907142359
Blood Product Expiration Date: 201907142359
ISSUE DATE / TIME: 201907091656
ISSUE DATE / TIME: 201907091656
Unit Type and Rh: 5100
Unit Type and Rh: 5100

## 2018-02-20 LAB — BPAM PLATELET PHERESIS
BLOOD PRODUCT EXPIRATION DATE: 201907102359
BLOOD PRODUCT EXPIRATION DATE: 201907112359
Blood Product Expiration Date: 201907102359
ISSUE DATE / TIME: 201907091622
ISSUE DATE / TIME: 201907091803
ISSUE DATE / TIME: 201907092009
UNIT TYPE AND RH: 9500
UNIT TYPE AND RH: 2800
Unit Type and Rh: 5100

## 2018-02-20 LAB — PREPARE PLATELET PHERESIS
UNIT DIVISION: 0
UNIT DIVISION: 0
Unit division: 0

## 2018-02-20 LAB — CBC
HCT: 28 % — ABNORMAL LOW (ref 39.0–52.0)
HEMATOCRIT: 27.4 % — AB (ref 39.0–52.0)
HEMOGLOBIN: 8.9 g/dL — AB (ref 13.0–17.0)
Hemoglobin: 9 g/dL — ABNORMAL LOW (ref 13.0–17.0)
MCH: 27.5 pg (ref 26.0–34.0)
MCH: 27.7 pg (ref 26.0–34.0)
MCHC: 32.5 g/dL (ref 30.0–36.0)
MCHC: 32.1 g/dL (ref 30.0–36.0)
MCV: 84.6 fL (ref 78.0–100.0)
MCV: 86.2 fL (ref 78.0–100.0)
PLATELETS: 121 10*3/uL — AB (ref 150–400)
Platelets: 133 10*3/uL — ABNORMAL LOW (ref 150–400)
RBC: 3.24 MIL/uL — ABNORMAL LOW (ref 4.22–5.81)
RBC: 3.25 MIL/uL — ABNORMAL LOW (ref 4.22–5.81)
RDW: 13.4 % (ref 11.5–15.5)
RDW: 14.1 % (ref 11.5–15.5)
WBC: 11.5 10*3/uL — ABNORMAL HIGH (ref 4.0–10.5)
WBC: 14 10*3/uL — AB (ref 4.0–10.5)

## 2018-02-20 LAB — POCT I-STAT, CHEM 8
BUN: 21 mg/dL — ABNORMAL HIGH (ref 6–20)
CHLORIDE: 102 mmol/L (ref 98–111)
CREATININE: 0.8 mg/dL (ref 0.61–1.24)
Calcium, Ion: 1.2 mmol/L (ref 1.15–1.40)
GLUCOSE: 157 mg/dL — AB (ref 70–99)
HCT: 26 % — ABNORMAL LOW (ref 39.0–52.0)
Hemoglobin: 8.8 g/dL — ABNORMAL LOW (ref 13.0–17.0)
Potassium: 4.3 mmol/L (ref 3.5–5.1)
Sodium: 137 mmol/L (ref 135–145)
TCO2: 22 mmol/L (ref 22–32)

## 2018-02-20 LAB — PREPARE FRESH FROZEN PLASMA: Unit division: 0

## 2018-02-20 LAB — BPAM CRYOPRECIPITATE
BLOOD PRODUCT EXPIRATION DATE: 201907092219
ISSUE DATE / TIME: 201907091656
Unit Type and Rh: 5100

## 2018-02-20 LAB — MAGNESIUM
MAGNESIUM: 2.6 mg/dL — AB (ref 1.7–2.4)
Magnesium: 2.9 mg/dL — ABNORMAL HIGH (ref 1.7–2.4)

## 2018-02-20 LAB — PREPARE CRYOPRECIPITATE: Unit division: 0

## 2018-02-20 LAB — POCT I-STAT 4, (NA,K, GLUC, HGB,HCT)
Glucose, Bld: 92 mg/dL (ref 70–99)
HEMATOCRIT: 29 % — AB (ref 39.0–52.0)
Hemoglobin: 9.9 g/dL — ABNORMAL LOW (ref 13.0–17.0)
Potassium: 3.4 mmol/L — ABNORMAL LOW (ref 3.5–5.1)
Sodium: 141 mmol/L (ref 135–145)

## 2018-02-20 LAB — CREATININE, SERUM
CREATININE: 1.03 mg/dL (ref 0.61–1.24)
GFR calc Af Amer: >60 mL/min (ref >60)
GFR calc non Af Amer: >60 mL/min (ref >60)

## 2018-02-20 MED ORDER — TRAMADOL HCL 50 MG PO TABS
50.0000 mg | ORAL_TABLET | ORAL | Status: DC | PRN
Start: 2018-02-20 — End: 2018-02-22
  Administered 2018-02-21: 50 mg via ORAL
  Filled 2018-02-20: qty 1

## 2018-02-20 MED ORDER — ORAL CARE MOUTH RINSE
15.0000 mL | Freq: Two times a day (BID) | OROMUCOSAL | Status: DC
Start: 2018-02-20 — End: 2018-02-25
  Administered 2018-02-20 – 2018-02-23 (×6): 15 mL via OROMUCOSAL

## 2018-02-20 MED ORDER — FUROSEMIDE 10 MG/ML IJ SOLN
40.0000 mg | Freq: Once | INTRAMUSCULAR | Status: AC
  Administered 2018-02-20: 40 mg via INTRAVENOUS
  Filled 2018-02-20: qty 4

## 2018-02-20 MED ORDER — ENOXAPARIN SODIUM 40 MG/0.4ML ~~LOC~~ SOLN
40.0000 mg | Freq: Every day | SUBCUTANEOUS | Status: DC
  Administered 2018-02-20 – 2018-02-24 (×5): 40 mg via SUBCUTANEOUS
  Filled 2018-02-20 (×5): qty 0.4

## 2018-02-20 MED ORDER — INSULIN ASPART 100 UNIT/ML ~~LOC~~ SOLN
0.0000 [IU] | SUBCUTANEOUS | Status: DC
  Administered 2018-02-20 – 2018-02-21 (×5): 2 [IU] via SUBCUTANEOUS

## 2018-02-20 MED FILL — Heparin Sodium (Porcine) Inj 1000 Unit/ML: INTRAMUSCULAR | Qty: 30 | Status: AC

## 2018-02-20 MED FILL — Magnesium Sulfate Inj 50%: INTRAMUSCULAR | Qty: 10 | Status: AC

## 2018-02-20 MED FILL — Thrombin For Soln 5000 Unit: CUTANEOUS | Qty: 4 | Status: AC

## 2018-02-20 MED FILL — Potassium Chloride Inj 2 mEq/ML: INTRAVENOUS | Qty: 40 | Status: AC

## 2018-02-20 NOTE — Plan of Care (Signed)
  Problem: Clinical Measurements: Goal: Respiratory complications will improve Outcome: Progressing   Problem: Nutrition: Goal: Adequate nutrition will be maintained Outcome: Progressing   Problem: Cardiac: Goal: Hemodynamic stability will improve Outcome: Progressing

## 2018-02-20 NOTE — Progress Notes (Signed)
1 Day Post-Op Procedure(s) (LRB): CORONARY ARTERY BYPASS GRAFTING (CABG)x3. LIMA TO LAD. SVG TO PL. SVG to OM. SAPHENOUS VEIN HARVEST. (N/A) BENTALL PROCEDURE (N/A) REPLACEMENT ASCENDING AORTA (N/A) TRANSESOPHAGEAL ECHOCARDIOGRAM (TEE) (N/A) Subjective: No complaints Extubated about 1 am.  Objective: Vital signs in last 24 hours: Temp:  [96.1 F (35.6 C)-100.2 F (37.9 C)] 99.7 F (37.6 C) (07/10 0700) Pulse Rate:  [27-99] 88 (07/10 0700) Cardiac Rhythm: Normal sinus rhythm (07/10 0400) Resp:  [16-32] 28 (07/10 0700) BP: (71-134)/(54-94) 91/63 (07/10 0700) SpO2:  [98 %-100 %] 100 % (07/10 0700) Arterial Line BP: (84-147)/(44-75) 108/50 (07/10 0700) FiO2 (%):  [40 %-50 %] 40 % (07/10 0022) Weight:  [104.8 kg (231 lb 0.7 oz)] 104.8 kg (231 lb 0.7 oz) (07/10 0500)  Hemodynamic parameters for last 24 hours: PAP: (23-48)/(11-25) 30/14 CO:  [4.2 L/min-5.8 L/min] 4.8 L/min CI:  [1.9 L/min/m2-2.6 L/min/m2] 2.2 L/min/m2  Intake/Output from previous day: 07/09 0701 - 07/10 0700 In: 7861.2 [I.V.:4401.9; Blood:2323; NG/GT:30; IV Piggyback:1106.3] Out: 7190 [Urine:4920; Blood:1500; Chest Tube:770] Intake/Output this shift: No intake/output data recorded.  General appearance: alert and cooperative Neurologic: intact Heart: regular rate and rhythm, S1, S2 normal, no murmur, click, rub or gallop Lungs: clear to auscultation bilaterally Extremities: edema moderate Wound: dressings dry  Lab Results: Recent Labs    02/19/18 1848 02/19/18 1856 02/20/18 0257  WBC 12.6*  --  11.5*  HGB 10.4* 9.9* 8.9*  HCT 31.3* 29.0* 27.4*  PLT 87*  --  133*   BMET:  Recent Labs    02/19/18 1645 02/19/18 1856 02/20/18 0257  NA 140 141 139  K 3.9 3.4* 4.5  CL 106  --  110  CO2  --   --  23  GLUCOSE 145* 92 112*  BUN 11  --  11  CREATININE 0.60*  --  0.76  CALCIUM  --   --  7.9*    PT/INR:  Recent Labs    02/19/18 1848  LABPROT 17.1*  INR 1.41   ABG    Component Value Date/Time    PHART 7.363 02/20/2018 0154   HCO3 25.0 02/20/2018 0154   TCO2 26 02/20/2018 0154   ACIDBASEDEF 3.0 (H) 02/19/2018 1851   O2SAT 99.0 02/20/2018 0154   CBG (last 3)  Recent Labs    02/20/18 0455 02/20/18 0600 02/20/18 0652  GLUCAP 96 108* 112*   CXR: mild left base atelectasis  ECG: sinus, early repol, no acute changes  Assessment/Plan: S/P Procedure(s) (LRB): CORONARY ARTERY BYPASS GRAFTING (CABG)x3. LIMA TO LAD. SVG TO PL. SVG to OM. SAPHENOUS VEIN HARVEST. (N/A) BENTALL PROCEDURE (N/A) REPLACEMENT ASCENDING AORTA (N/A) TRANSESOPHAGEAL ECHOCARDIOGRAM (TEE) (N/A)  POD 1 Hemodynamically stable in sinus rhythm. Hold beta blocker today since still on neo and low dose dopamine. Wean these as tolerated.  Volume excess: wt is recorded at only 3 lbs over preop which is unlikely. Will diurese once off pressors.  Glucose under good control and insulin drip is off. preop Hgb A1c was 5.2. Will check CBG's today since he received steroids for circ arrest but should be able to stop later.  Keep chest tubes in today  OOB to chair, IS.    LOS: 2 days    Alleen BorneBryan K Taelar Gronewold 02/20/2018

## 2018-02-20 NOTE — Progress Notes (Signed)
Patient ID: Tyler RichterDavid Kemp, male   DOB: 03/18/1961, 57 y.o.   MRN: [id number] [facility] Evening Rounds:  Hemodynamically stable in sinus rhythm off neo. Weaning off dopamine.  Says he is comfortable.  Urine output ok  CT output low  BMET    Component Value Date/Time   NA 137 02/20/2018 1622   NA 138 11/26/2017 1645   K 4.3 02/20/2018 1622   CL 102 02/20/2018 1622   CO2 23 02/20/2018 0257   GLUCOSE 157 (H) 02/20/2018 1622   BUN 21 (H) 02/20/2018 1622   BUN 14 11/26/2017 1645   CREATININE 0.80 02/20/2018 1622   CALCIUM 7.9 (L) 02/20/2018 0257   GFRNONAA >60 02/20/2018 0257   GFRAA >60 02/20/2018 0257   CBC    Component Value Date/Time   WBC 11.5 (H) 02/20/2018 0257   RBC 3.24 (L) 02/20/2018 0257   HGB 8.8 (L) 02/20/2018 1622   HGB 12.9 (L) 11/26/2017 1645   HCT 26.0 (L) 02/20/2018 1622   HCT 39.4 11/26/2017 1645   PLT 133 (L) 02/20/2018 0257   PLT 210 11/26/2017 1645   MCV 84.6 02/20/2018 0257   MCV 85 11/26/2017 1645   MCH 27.5 02/20/2018 0257   MCHC 32.5 02/20/2018 0257   RDW 13.4 02/20/2018 0257   RDW 13.8 11/26/2017 1645    A/P: doing well. Continue current plans. Wean off dopamine. Diurese.

## 2018-02-20 NOTE — Procedures (Signed)
Extubation Procedure Note  Patient Details:   Name: Tyler RichterDavid Kemp DOB: 10/09/1960 MRN: [id number]   RT extubated patient on 4L [place]. Patient got NIF -30 and V 650. Patient tolerating well. No stridor noted. RN at beside doing IS with patient.   Evaluation  O2 sats: stable throughout Complications: No apparent complications Patient did tolerate procedure well. Bilateral Breath Sounds: Clear, Diminished   Yes  Letta MedianVictor E Joory Gough 02/20/2018, [phone]

## 2018-02-21 ENCOUNTER — Inpatient Hospital Stay (HOSPITAL_COMMUNITY): Payer: BLUE CROSS/BLUE SHIELD

## 2018-02-21 LAB — GLUCOSE, CAPILLARY
GLUCOSE-CAPILLARY: 148 mg/dL — AB (ref 70–99)
GLUCOSE-CAPILLARY: 115 mg/dL — AB (ref 70–99)
GLUCOSE-CAPILLARY: 134 mg/dL — AB (ref 70–99)
Glucose-Capillary: 134 mg/dL — ABNORMAL HIGH (ref 70–99)
Glucose-Capillary: 135 mg/dL — ABNORMAL HIGH (ref 70–99)

## 2018-02-21 LAB — BASIC METABOLIC PANEL
ANION GAP: 7 (ref 5–15)
BUN: 24 mg/dL — ABNORMAL HIGH (ref 6–20)
CHLORIDE: 103 mmol/L (ref 98–111)
CO2: 26 mmol/L (ref 22–32)
Calcium: 8.4 mg/dL — ABNORMAL LOW (ref 8.9–10.3)
Creatinine, Ser: 1.12 mg/dL (ref 0.61–1.24)
GFR calc non Af Amer: >60 mL/min (ref >60)
Glucose, Bld: 151 mg/dL — ABNORMAL HIGH (ref 70–99)
Potassium: 4.4 mmol/L (ref 3.5–5.1)
Sodium: 136 mmol/L (ref 135–145)

## 2018-02-21 LAB — CBC
HEMATOCRIT: 26.7 % — AB (ref 39.0–52.0)
HEMOGLOBIN: 8.5 g/dL — AB (ref 13.0–17.0)
MCH: 28.1 pg (ref 26.0–34.0)
MCHC: 31.8 g/dL (ref 30.0–36.0)
MCV: 88.4 fL (ref 78.0–100.0)
PLATELETS: 117 10*3/uL — AB (ref 150–400)
RBC: 3.02 MIL/uL — ABNORMAL LOW (ref 4.22–5.81)
RDW: 14.5 % (ref 11.5–15.5)
WBC: 14.7 10*3/uL — ABNORMAL HIGH (ref 4.0–10.5)

## 2018-02-21 MED ORDER — METOPROLOL TARTRATE 12.5 MG HALF TABLET
12.5000 mg | ORAL_TABLET | Freq: Two times a day (BID) | ORAL | Status: DC
  Administered 2018-02-21 – 2018-02-22 (×3): 12.5 mg via ORAL
  Filled 2018-02-21 (×3): qty 1

## 2018-02-21 MED ORDER — FUROSEMIDE 10 MG/ML IJ SOLN
40.0000 mg | Freq: Once | INTRAMUSCULAR | Status: AC
  Administered 2018-02-21: 40 mg via INTRAVENOUS
  Filled 2018-02-21: qty 4

## 2018-02-21 MED ORDER — INSULIN ASPART 100 UNIT/ML ~~LOC~~ SOLN
0.0000 [IU] | Freq: Three times a day (TID) | SUBCUTANEOUS | Status: DC
  Administered 2018-02-21 (×2): 2 [IU] via SUBCUTANEOUS

## 2018-02-21 MED ORDER — METOLAZONE 5 MG PO TABS
5.0000 mg | ORAL_TABLET | Freq: Once | ORAL | Status: AC
  Administered 2018-02-21: 5 mg via ORAL
  Filled 2018-02-21: qty 1

## 2018-02-21 MED ORDER — POTASSIUM CHLORIDE CRYS ER 20 MEQ PO TBCR
20.0000 meq | EXTENDED_RELEASE_TABLET | Freq: Three times a day (TID) | ORAL | Status: AC
  Administered 2018-02-21 (×3): 20 meq via ORAL
  Filled 2018-02-21 (×3): qty 1

## 2018-02-21 MED FILL — Sodium Bicarbonate IV Soln 8.4%: INTRAVENOUS | Qty: 50 | Status: AC

## 2018-02-21 MED FILL — Sodium Chloride IV Soln 0.9%: INTRAVENOUS | Qty: 3000 | Status: AC

## 2018-02-21 MED FILL — Magnesium Sulfate Inj 50%: INTRAMUSCULAR | Qty: 10 | Status: AC

## 2018-02-21 MED FILL — Albumin, Human Inj 5%: INTRAVENOUS | Qty: 500 | Status: AC

## 2018-02-21 MED FILL — Potassium Chloride Inj 2 mEq/ML: INTRAVENOUS | Qty: 40 | Status: AC

## 2018-02-21 MED FILL — Calcium Chloride Inj 10%: INTRAVENOUS | Qty: 10 | Status: AC

## 2018-02-21 MED FILL — Mannitol IV Soln 20%: INTRAVENOUS | Qty: 1000 | Status: AC

## 2018-02-21 MED FILL — Heparin Sodium (Porcine) Inj 1000 Unit/ML: INTRAMUSCULAR | Qty: 30 | Status: AC

## 2018-02-21 MED FILL — Electrolyte-R (PH 7.4) Solution: INTRAVENOUS | Qty: 5000 | Status: AC

## 2018-02-21 MED FILL — Lidocaine HCl(Cardiac) IV PF Soln Pref Syr 100 MG/5ML (2%): INTRAVENOUS | Qty: 30 | Status: AC

## 2018-02-21 NOTE — Progress Notes (Signed)
      [address]       [place] [id number]             [phone]      POD # 2 CABG x 3, Bentall  Sleeping currently  BP 139/69   Pulse 93   Temp 98.1 F (36.7 C) (Oral)   Resp (!) 25   Ht 5' 7.5" (1.715 m)   Wt 234 lb (106.1 kg)   SpO2 97%   BMI 36.11 kg/m   Intake/Output Summary (Last 24 hours) at 02/21/2018 1812 Last data filed at 02/21/2018 1700 Gross per 24 hour  Intake 785.82 ml  Output 2695 ml  Net -1909.18 ml   CBG well controlled  Continue current care  Pravin Perezperez C. Dorris FetchHendrickson, MD [facility] [phone]

## 2018-02-21 NOTE — Plan of Care (Signed)
  Problem: Activity: Goal: Risk for activity intolerance will decrease Outcome: Progressing Note:  Patient walked small lap today.    Problem: Nutrition: Goal: Adequate nutrition will be maintained Outcome: Progressing   Problem: Elimination: Goal: Will not experience complications related to bowel motility Outcome: Progressing   Problem: Pain Managment: Goal: General experience of comfort will improve Outcome: Progressing Note:  Patient took less pain medication today than overnight last night.    Problem: Cardiac: Goal: Hemodynamic stability will improve Outcome: Progressing

## 2018-02-21 NOTE — Progress Notes (Signed)
2 Days Post-Op Procedure(s) (LRB): CORONARY ARTERY BYPASS GRAFTING (CABG)x3. LIMA TO LAD. SVG TO PL. SVG to OM. SAPHENOUS VEIN HARVEST. (N/A) BENTALL PROCEDURE (N/A) REPLACEMENT ASCENDING AORTA (N/A) TRANSESOPHAGEAL ECHOCARDIOGRAM (TEE) (N/A) Subjective: Sore and hard to get comfortable.  Weak right hand grip  Objective: Vital signs in last 24 hours: Temp:  [98.3 F (36.8 C)-99.5 F (37.5 C)] 98.8 F (37.1 C) (07/11 0744) Pulse Rate:  [80-100] 89 (07/11 0700) Cardiac Rhythm: Normal sinus rhythm (07/11 0400) Resp:  [17-39] 30 (07/11 0700) BP: (84-133)/(56-86) 111/76 (07/11 0700) SpO2:  [94 %-100 %] 97 % (07/11 0700) Arterial Line BP: (110-141)/(49-61) 120/56 (07/10 1700) Weight:  [106.1 kg (234 lb)] 106.1 kg (234 lb) (07/11 0515)  Hemodynamic parameters for last 24 hours: PAP: (34-45)/(12-21) 45/12  Intake/Output from previous day: 07/10 0701 - 07/11 0700 In: 1113.9 [P.O.:270; I.V.:644; IV Piggyback:199.9] Out: 2145 [Urine:1795; Chest Tube:350] Intake/Output this shift: No intake/output data recorded.  General appearance: alert and cooperative Neurologic: right hand grip is weak Heart: regular rate and rhythm, S1, S2 normal, no murmur, click, rub or gallop Lungs: diminished breath sounds bibasilar Abdomen: soft, non-tender; bowel sounds normal; no masses,  no organomegaly Extremities: edema moderate Wound: dressings dry  Lab Results: Recent Labs    02/20/18 1601 02/20/18 1622 02/21/18 0338  WBC 14.0*  --  14.7*  HGB 9.0* 8.8* 8.5*  HCT 28.0* 26.0* 26.7*  PLT 121*  --  117*   BMET:  Recent Labs    02/20/18 0257  02/20/18 1622 02/21/18 0338  NA 139  --  137 136  K 4.5  --  4.3 4.4  CL 110  --  102 103  CO2 23  --   --  26  GLUCOSE 112*  --  157* 151*  BUN 11  --  21* 24*  CREATININE 0.76   < > 0.80 1.12  CALCIUM 7.9*  --   --  8.4*   < > = values in this interval not displayed.    PT/INR:  Recent Labs    02/19/18 1848  LABPROT 17.1*  INR 1.41    ABG    Component Value Date/Time   PHART 7.363 02/20/2018 0154   HCO3 25.0 02/20/2018 0154   TCO2 22 02/20/2018 1622   ACIDBASEDEF 3.0 (H) 02/19/2018 1851   O2SAT 99.0 02/20/2018 0154   CBG (last 3)  Recent Labs    02/20/18 2033 02/20/18 2353 02/21/18 0338  GLUCAP 126* 134* 148*   CXR: bilateral lower lobe atelectasis.  Assessment/Plan: S/P Procedure(s) (LRB): CORONARY ARTERY BYPASS GRAFTING (CABG)x3. LIMA TO LAD. SVG TO PL. SVG to OM. SAPHENOUS VEIN HARVEST. (N/A) BENTALL PROCEDURE (N/A) REPLACEMENT ASCENDING AORTA (N/A) TRANSESOPHAGEAL ECHOCARDIOGRAM (TEE) (N/A)  POD 2 Hemodynamically stable in sinus rhythm. Will start low dose beta blocker since off pressors.  Volume excess: 6 lbs over preop wt recorded and I suspect more so will continue diuresis.  Glucose under adequate control but 140-150's at times so will continue SSI AC. May be due to sugary liquids.  Chest tube output decreasing and thin serosanguinous. Will remove today.  OOB, IS, begin ambulation today.  Weak right grip probably due to brachial plexus stretch from sternotomy and right axillary artery exposure. This should continue to improve.  LOS: 3 days    Alleen BorneBryan K Malillany Kazlauskas 02/21/2018

## 2018-02-22 ENCOUNTER — Inpatient Hospital Stay (HOSPITAL_COMMUNITY): Payer: BLUE CROSS/BLUE SHIELD

## 2018-02-22 ENCOUNTER — Other Ambulatory Visit: Payer: Self-pay

## 2018-02-22 LAB — GLUCOSE, CAPILLARY
GLUCOSE-CAPILLARY: 94 mg/dL (ref 70–99)
GLUCOSE-CAPILLARY: 91 mg/dL (ref 70–99)
Glucose-Capillary: 84 mg/dL (ref 70–99)
Glucose-Capillary: 87 mg/dL (ref 70–99)

## 2018-02-22 LAB — CBC
HEMATOCRIT: 23.6 % — AB (ref 39.0–52.0)
Hemoglobin: 7.5 g/dL — ABNORMAL LOW (ref 13.0–17.0)
MCH: 28 pg (ref 26.0–34.0)
MCHC: 31.8 g/dL (ref 30.0–36.0)
MCV: 88.1 fL (ref 78.0–100.0)
PLATELETS: 104 10*3/uL — AB (ref 150–400)
RBC: 2.68 MIL/uL — ABNORMAL LOW (ref 4.22–5.81)
RDW: 14.7 % (ref 11.5–15.5)
WBC: 11.6 10*3/uL — AB (ref 4.0–10.5)

## 2018-02-22 LAB — BASIC METABOLIC PANEL
Anion gap: 8 (ref 5–15)
BUN: 22 mg/dL — ABNORMAL HIGH (ref 6–20)
CHLORIDE: 97 mmol/L — AB (ref 98–111)
CO2: 30 mmol/L (ref 22–32)
CREATININE: 0.9 mg/dL (ref 0.61–1.24)
Calcium: 8.5 mg/dL — ABNORMAL LOW (ref 8.9–10.3)
GFR calc Af Amer: >60 mL/min (ref >60)
GFR calc non Af Amer: >60 mL/min (ref >60)
GLUCOSE: 121 mg/dL — AB (ref 70–99)
POTASSIUM: 3.5 mmol/L (ref 3.5–5.1)
SODIUM: 135 mmol/L (ref 135–145)

## 2018-02-22 MED ORDER — SODIUM CHLORIDE 0.9 % IV SOLN: 250.0000 mL | INTRAVENOUS | Status: DC | PRN

## 2018-02-22 MED ORDER — METOPROLOL TARTRATE 5 MG/5ML IV SOLN
5.0000 mg | Freq: Once | INTRAVENOUS | Status: AC
  Administered 2018-02-22: 5 mg via INTRAVENOUS

## 2018-02-22 MED ORDER — SODIUM CHLORIDE 0.9% FLUSH
3.0000 mL | Freq: Two times a day (BID) | INTRAVENOUS | Status: DC
  Administered 2018-02-22 – 2018-02-25 (×7): 3 mL via INTRAVENOUS

## 2018-02-22 MED ORDER — METOPROLOL TARTRATE 12.5 MG HALF TABLET
12.5000 mg | ORAL_TABLET | Freq: Two times a day (BID) | ORAL | Status: DC
  Administered 2018-02-22 – 2018-02-23 (×2): 12.5 mg via ORAL
  Filled 2018-02-22 (×2): qty 1

## 2018-02-22 MED ORDER — SODIUM CHLORIDE 0.9% FLUSH: 3.0000 mL | INTRAVENOUS | Status: DC | PRN

## 2018-02-22 MED ORDER — METOPROLOL TARTRATE 5 MG/5ML IV SOLN
INTRAVENOUS | Status: AC
  Filled 2018-02-22: qty 5

## 2018-02-22 MED ORDER — ATORVASTATIN CALCIUM 40 MG PO TABS
40.0000 mg | ORAL_TABLET | Freq: Every day | ORAL | Status: DC
  Administered 2018-02-22 – 2018-02-24 (×3): 40 mg via ORAL
  Filled 2018-02-22 (×3): qty 1

## 2018-02-22 MED ORDER — DOCUSATE SODIUM 100 MG PO CAPS
200.0000 mg | ORAL_CAPSULE | Freq: Every day | ORAL | Status: DC
  Administered 2018-02-23 – 2018-02-25 (×3): 200 mg via ORAL
  Filled 2018-02-22 (×3): qty 2

## 2018-02-22 MED ORDER — BISACODYL 10 MG RE SUPP: 10.0000 mg | Freq: Every day | RECTAL | Status: DC | PRN

## 2018-02-22 MED ORDER — FE FUMARATE-B12-VIT C-FA-IFC PO CAPS
1.0000 | ORAL_CAPSULE | Freq: Three times a day (TID) | ORAL | Status: DC
  Administered 2018-02-22 – 2018-02-25 (×10): 1 via ORAL
  Filled 2018-02-22 (×10): qty 1

## 2018-02-22 MED ORDER — ASPIRIN EC 325 MG PO TBEC
325.0000 mg | DELAYED_RELEASE_TABLET | Freq: Every day | ORAL | Status: DC
  Administered 2018-02-22 – 2018-02-25 (×4): 325 mg via ORAL
  Filled 2018-02-22 (×3): qty 1

## 2018-02-22 MED ORDER — TRAMADOL HCL 50 MG PO TABS: 50.0000 mg | ORAL_TABLET | ORAL | Status: DC | PRN

## 2018-02-22 MED ORDER — PANTOPRAZOLE SODIUM 40 MG PO TBEC
40.0000 mg | DELAYED_RELEASE_TABLET | Freq: Every day | ORAL | Status: DC
  Administered 2018-02-23 – 2018-02-25 (×3): 40 mg via ORAL
  Filled 2018-02-22 (×3): qty 1

## 2018-02-22 MED ORDER — ACETAMINOPHEN 325 MG PO TABS: 650.0000 mg | ORAL_TABLET | Freq: Four times a day (QID) | ORAL | Status: DC | PRN

## 2018-02-22 MED ORDER — ONDANSETRON HCL 4 MG/2ML IJ SOLN: 4.0000 mg | Freq: Four times a day (QID) | INTRAMUSCULAR | Status: DC | PRN

## 2018-02-22 MED ORDER — MOVING RIGHT ALONG BOOK
Freq: Once | Status: AC
  Administered 2018-02-22: 12:00:00
  Filled 2018-02-22: qty 1

## 2018-02-22 MED ORDER — OXYCODONE HCL 5 MG PO TABS
5.0000 mg | ORAL_TABLET | ORAL | Status: DC | PRN
  Administered 2018-02-22 – 2018-02-23 (×2): 10 mg via ORAL
  Administered 2018-02-24: 5 mg via ORAL
  Administered 2018-02-24: 10 mg via ORAL
  Administered 2018-02-25: 5 mg via ORAL
  Filled 2018-02-22: qty 1
  Filled 2018-02-22 (×2): qty 2
  Filled 2018-02-22: qty 1
  Filled 2018-02-22: qty 2

## 2018-02-22 MED ORDER — ONDANSETRON HCL 4 MG PO TABS
4.0000 mg | ORAL_TABLET | Freq: Four times a day (QID) | ORAL | Status: DC | PRN
  Filled 2018-02-22: qty 1

## 2018-02-22 MED ORDER — BISACODYL 5 MG PO TBEC
10.0000 mg | DELAYED_RELEASE_TABLET | Freq: Every day | ORAL | Status: DC | PRN
  Administered 2018-02-24: 10 mg via ORAL
  Filled 2018-02-22: qty 2

## 2018-02-22 MED ORDER — POTASSIUM CHLORIDE 10 MEQ/50ML IV SOLN
10.0000 meq | INTRAVENOUS | Status: AC
  Administered 2018-02-22 (×3): 10 meq via INTRAVENOUS
  Filled 2018-02-22 (×3): qty 50

## 2018-02-22 NOTE — Discharge Instructions (Signed)
Aortic Valve Replacement, Care After °Refer to this sheet in the next few weeks. These instructions provide you with information about caring for yourself after your procedure. Your health care provider may also give you more specific instructions. Your treatment has been planned according to current medical practices, but problems sometimes occur. Call your health care provider if you have any problems or questions after your procedure. °What can I expect after the procedure? °After the procedure, it is common to have: °· Pain around your incision area. °· A small amount of blood or clear fluid coming from your incision. ° °Follow these instructions at home: °Eating and drinking ° °· Follow instructions from your health care provider about eating or drinking restrictions. °? Limit alcohol intake to no more than 1 drink per day for nonpregnant women and 2 drinks per day for men. One drink equals 12 oz of beer, 5 oz of wine, or 1½ oz of hard liquor. °? Limit how much caffeine you drink. Caffeine can affect your heart's rate and rhythm. °· Drink enough fluid to keep your urine clear or pale yellow. °· Eat a heart-healthy diet. This should include plenty of fresh fruits and vegetables. If you eat meat, it should be lean cuts. Avoid foods that are: °? High in salt, saturated fat, or sugar. °? Canned or highly processed. °? Fried. °Activity °· Return to your normal activities as told by your health care provider. Ask your health care provider what activities are safe for you. °· Exercise regularly once you have recovered, as told by your health care provider. °· Avoid sitting for more than 2 hours at a time without moving. Get up and move around at least once every 1-2 hours. This helps to prevent blood clots in the legs. °· Do not lift anything that is heavier than 10 lb (4.5 kg) until your health care provider approves. °· Avoid pushing or pulling things with your arms until your health care provider approves. This  includes pulling on handrails to help you climb stairs. °Incision care ° °· Follow instructions from your health care provider about how to take care of your incision. Make sure you: °? Wash your hands with soap and water before you change your bandage (dressing). If soap and water are not available, use hand sanitizer. °? Change your dressing as told by your health care provider. °? Leave stitches (sutures), skin glue, or adhesive strips in place. These skin closures may need to stay in place for 2 weeks or longer. If adhesive strip edges start to loosen and curl up, you may trim the loose edges. Do not remove adhesive strips completely unless your health care provider tells you to do that. °· Check your incision area every day for signs of infection. Check for: °? More redness, swelling, or pain. °? More fluid or blood. °? Warmth. °? Pus or a bad smell. °Medicines °· Take over-the-counter and prescription medicines only as told by your health care provider. °· If you were prescribed an antibiotic medicine, take it as told by your health care provider. Do not stop taking the antibiotic even if you start to feel better. °Travel °· Avoid airplane travel for as long as told by your health care provider. °· When you travel, bring a list of your medicines and a record of your medical history with you. Carry your medicines with you. °Driving °· Ask your health care provider when it is safe for you to drive. Do not drive until your health   care provider approves. °· Do not drive or operate heavy machinery while taking prescription pain medicine. °Lifestyle ° °· Do not use any tobacco products, such as cigarettes, chewing tobacco, or e-cigarettes. If you need help quitting, ask your health care provider. °· Resume sexual activity as told by your health care provider. Do not use medicines for erectile dysfunction unless your health care provider approves, if this applies. °· Work with your health care provider to keep your  blood pressure and cholesterol under control, and to manage any other heart conditions that you have. °· Maintain a healthy weight. °General instructions °· Do not take baths, swim, or use a hot tub until your health care provider approves. °· Do not strain to have a bowel movement. °· Avoid crossing your legs while sitting down. °· Check your temperature every day for a fever. A fever may be a sign of infection. °· If you are a woman and you plan to become pregnant, talk with your health care provider before you become pregnant. °· Wear compression stockings if your health care provider instructs you to do this. These stockings help to prevent blood clots and reduce swelling in your legs. °· Tell all health care providers who care for you that you have an artificial (prosthetic) aortic valve. If you have or have had heart disease or endocarditis, tell all health care providers about these conditions as well. °· Keep all follow-up visits as told by your health care provider. This is important. °Contact a health care provider if: °· You develop a skin rash. °· You experience sudden, unexplained changes in your weight. °· You have more redness, swelling, or pain around your incision. °· You have more fluid or blood coming from your incision. °· Your incision feels warm to the touch. °· You have pus or a bad smell coming from your incision. °· You have a fever. °Get help right away if: °· You develop chest pain that is different from the pain coming from your incision. °· You develop shortness of breath or difficulty breathing. °· You start to feel light-headed. °These symptoms may represent a serious problem that is an emergency. Do not wait to see if the symptoms will go away. Get medical help right away. Call your local emergency services (911 in the [place] Do not drive yourself to the hospital. °This information is not intended to replace advice given to you by your health care provider. Make sure you discuss any  questions you have with your health care provider. °Document Released: 02/16/2005 Document Revised: 01/06/2016 Document Reviewed: 07/04/2015 °Elsevier Interactive Patient Education © 2017 [employer] ° °Coronary Artery Bypass Grafting, Care After °These instructions give you information on caring for yourself after your procedure. Your doctor may also give you more specific instructions. Call your doctor if you have any problems or questions after your procedure. °Follow these instructions at home: °· Only take medicine as told by your doctor. Take medicines exactly as told. Do not stop taking medicines or start any new medicines without talking to your doctor first. °· Take your pulse as told by your doctor. °· Do deep breathing as told by your doctor. Use your breathing device (incentive spirometer), if given, to practice deep breathing several times a day. Support your chest with a pillow or your arms when you take deep breaths or cough. °· Keep the area clean, dry, and protected where the surgery cuts (incisions) were made. Remove bandages (dressings) only as told by your doctor. If   strips were applied to surgical area, do not take them off. They fall off on their own. °· Check the surgery area daily for puffiness (swelling), redness, or leaking fluid. °· If surgery cuts were made in your legs: °? Avoid crossing your legs. °? Avoid sitting for long periods of time. Change positions every 30 minutes. °? Raise your legs when you are sitting. Place them on pillows. °· Wear stockings that help keep blood clots from forming in your legs (compression stockings). °· Only take sponge baths until your doctor says it is okay to take showers. Pat the surgery area dry. Do not rub the surgery area with a washcloth or towel. Do not bathe, swim, or use a hot tub until your doctor says it is okay. °· Eat foods that are high in fiber. These include raw fruits and vegetables, whole grains, beans, and nuts. Choose lean meats.  Avoid canned, processed, and fried foods. °· Drink enough fluids to keep your pee (urine) clear or pale yellow. °· Weigh yourself every day. °· Rest and limit activity as told by your doctor. You may be told to: °? Stop any activity if you have chest pain, shortness of breath, changes in heartbeat, or dizziness. Get help right away if this happens. °? Move around often for short amounts of time or take short walks as told by your doctor. Gradually become more active. You may need help to strengthen your muscles and build endurance. °? Avoid lifting, pushing, or pulling anything heavier than 10 pounds (4.5 kg) for at least 6 weeks after surgery. °· Do not drive until your doctor says it is okay. °· Ask your doctor when you can go back to work. °· Ask your doctor when you can begin sexual activity again. °· Follow up with your doctor as told. °Contact a doctor if: °· You have puffiness, redness, more pain, or fluid draining from the incision site. °· You have a fever. °· You have puffiness in your ankles or legs. °· You have pain in your legs. °· You gain 2 or more pounds (0.9 kg) a day. °· You feel sick to your stomach (nauseous) or throw up (vomit). °· You have watery poop (diarrhea). °Get help right away if: °· You have chest pain that goes to your jaw or arms. °· You have shortness of breath. °· You have a fast or irregular heartbeat. °· You notice a "clicking" in your breastbone when you move. °· You have numbness or weakness in your arms or legs. °· You feel dizzy or light-headed. °This information is not intended to replace advice given to you by your health care provider. Make sure you discuss any questions you have with your health care provider. °Document Released: 08/05/2013 Document Revised: 01/06/2016 Document Reviewed: 01/07/2013 °Elsevier Interactive Patient Education © 2017 [employer] ° °

## 2018-02-22 NOTE — Evaluation (Signed)
Physical Therapy Evaluation Patient Details Name: Tyler Kemp MRN: [id number] DOB: 28-Feb-1961 Today's Date: 02/22/2018   History of Present Illness  57 y.o. male s/p CABGx3 and AVR 7/09. PMH includes: CAD, aortic stenosis, PNA, Hiatal Hernia, GERD  Clinical Impression  Patient is s/p above surgery resulting in functional limitations due to the deficits listed below (see PT Problem List). PTA, pt living alone near family, independent with all mobility and working. Upon eval pt ambulating 400' in unit with eva walker on RA, VSS, with min guard and chair follow for safety. Family present, pt reports his sisters can stay with him for short term and provide assistance at home, feel he will progress to safely return home with  HHPT to transition to OP cardiac rehab. reviewed sternal precautions, therex, and encouraged patient to ambulate with nursing staff over weekend.    Patient will benefit from skilled PT to increase their independence and safety with mobility to allow discharge to the venue listed below.    Vitals:  BP rest 96/66 with activity 116/69 SpO2 WNL on RA HR 95-114max     Follow Up Recommendations Home health PT;Supervision/Assistance - 24 hour  (OP CARDIAC REHAB)    Equipment Recommendations  (TBD)    Recommendations for Other Services       Precautions / Restrictions Precautions Precautions: Sternal Precaution Booklet Issued: No Precaution Comments: verbally reviewed precautions with pt and family, verablizing understanding.  Restrictions Weight Bearing Restrictions: Yes Other Position/Activity Restrictions: Sternal       Mobility  Bed Mobility Overal bed mobility: Needs Assistance Bed Mobility: Rolling;Supine to Sit Rolling: Min assist   Supine to sit: Min assist     General bed mobility comments: min A to assist raising trunk up due to pain. cues for sternal precautions  Transfers Overall transfer level: Needs assistance Equipment used: Fara Boros) Transfers: Sit to/from Stand Sit to Stand: Min assist         General transfer comment: min A to power up, cues for hand placement to adhere to sternal precautoins   Ambulation/Gait Ambulation/Gait assistance: Min guard;Supervision Gait Distance (Feet): 400 Feet Assistive device: Fara Boros) Gait Pattern/deviations: Step-through pattern;Step-to pattern;Decreased stride length Gait velocity: decreased   General Gait Details: Pt ambulating unit with supervision on RA, VSS, chair follow for safety pt slow and antalgic, no LOB, reports some nausea during session.   Stairs            Wheelchair Mobility    Modified Rankin (Stroke Patients Only)       Balance Overall balance assessment: Mild deficits observed, not formally tested                                           Pertinent Vitals/Pain Pain Assessment: Faces Faces Pain Scale: Hurts even more Pain Location: post op chest incision  Pain Descriptors / Indicators: Discomfort Pain Intervention(s): Limited activity within patient's tolerance;Monitored during session;Premedicated before session;Repositioned    Home Living Family/patient expects to be discharged to:: Private residence Living Arrangements: Alone Available Help at Discharge: Family;Available 24 hours/day Type of Home: House Home Access: Ramped entrance     Home Layout: One level Home Equipment: Walker - 2 wheels;Walker - 4 wheels;Grab bars - toilet;Grab bars - tub/shower      Prior Function Level of Independence: Independent         Comments: working in [employer], driving,  living alone independent.      Hand Dominance        Extremity/Trunk Assessment   Upper Extremity Assessment Upper Extremity Assessment: Overall WFL for tasks assessed    Lower Extremity Assessment Lower Extremity Assessment: Overall WFL for tasks assessed       Communication   Communication: No difficulties  Cognition  Arousal/Alertness: Awake/alert Behavior During Therapy: WFL for tasks assessed/performed Overall Cognitive Status: Within Functional Limits for tasks assessed                                        General Comments      Exercises General Exercises - Lower Extremity Ankle Circles/Pumps: 20 reps Quad Sets: 10 reps Long Arc Quad: 10 reps   Assessment/Plan    PT Assessment Patient needs continued PT services  PT Problem List Decreased strength;Decreased range of motion;Decreased activity tolerance;Decreased balance;Decreased mobility;Pain       PT Treatment Interventions DME instruction;Gait training;Stair training;Functional mobility training;Therapeutic activities;Therapeutic exercise    PT Goals (Current goals can be found in the Care Plan section)  Acute Rehab PT Goals Patient Stated Goal: go home with family support  PT Goal Formulation: With patient Time For Goal Achievement: 03/01/18 Potential to Achieve Goals: Good    Frequency Min 3X/week   Barriers to discharge        Co-evaluation               AM-PAC PT "6 Clicks" Daily Activity  Outcome Measure Difficulty turning over in bed (including adjusting bedclothes, sheets and blankets)?: Unable Difficulty moving from lying on back to sitting on the side of the bed? : Unable Difficulty sitting down on and standing up from a chair with arms (e.g., wheelchair, bedside commode, etc,.)?: Unable Help needed moving to and from a bed to chair (including a wheelchair)?: A Little Help needed walking in hospital room?: A Little Help needed climbing 3-5 steps with a railing? : A Lot 6 Click Score: 11    End of Session Equipment Utilized During Treatment: Gait belt Activity Tolerance: Patient tolerated treatment well Patient left: in bed;with call bell/phone within reach;with family/visitor present   PT Visit Diagnosis: Unsteadiness on feet (R26.81);Pain;Other abnormalities of gait and mobility  (R26.89) Pain - part of body: (Chest)    Time: 1420-1500 PT Time Calculation (min) (ACUTE ONLY): 40 min   Charges:   PT Evaluation $PT Eval Low Complexity: 1 Low PT Treatments $Gait Training: 8-22 mins   PT G Codes:       Etta GrandchildSean Mataeo Ingwersen, PT, DPT Acute Rehab Services Pager: [phone]    Etta GrandchildSean Margorie Renner 02/22/2018, 6:26 PM

## 2018-02-22 NOTE — Plan of Care (Signed)
  Problem: Education: Goal: Knowledge of General Education information will improve Outcome: Progressing   Problem: Health Behavior/Discharge Planning: Goal: Ability to manage health-related needs will improve Outcome: Progressing   Problem: Clinical Measurements: Goal: Ability to maintain clinical measurements within normal limits will improve Outcome: Progressing   

## 2018-02-22 NOTE — Discharge Summary (Signed)
Physician Discharge Summary  Patient ID: Tyler Kemp MRN: [id number] DOB/AGE: [birth year] 57 y.o.  Admit date: 02/18/2018 Discharge date: 02/25/2018  Admission Diagnoses: Severe multivessel coronary artery disease and severe aortic stenosis.  Discharge Diagnoses:  Active Problems:   Aortic stenosis   S/P AVR  Patient Active Problem List   Diagnosis Date Noted  . S/P AVR 02/19/2018  . Aortic stenosis 02/18/2018  . Thoracic aortic aneurysm (HCC) 12/11/2017  . Food impaction of esophagus 12/11/2017  . Ex-smoker 11/26/2017  . Coronary artery disease 10/03/2017  . GERD (gastroesophageal reflux disease) 10/03/2017  . Severe aortic stenosis 06/15/2016   History of present illness:  The patient is a 57 year old male referred to Dr. Laneta Simmers for cardiothoracic surgical opinion due to severe multivessel coronary artery disease and aortic stenosis.  He has a history of PCI and stenting in the past at [facility] has been followed in the past by Dr. Tomie China.  Recent follow-up echocardiogram in April 2019 has shown progression of aortic stenosis to severe with a mean gradient of 51 mmHg and a peak gradient of 80 mmHg.  The dimensionless index was 0.19 and left ventricular ejection fraction was 55 to 60%.  Additionally he was noted to have a aortic root which was dilated to 46 mm.  Cardiac catheterization done 12/04/2017 showed severe diffuse coronary disease with a 50% ostial and a 40% distal left main stenosis.  The LAD had an eccentric 75% stenosis distal to the previous placed proximal to mid LAD stent.  There is high-grade obstruction in the second marginal branch.  There is a high-grade stenosis in the second posterior lateral branch of the right coronary.  Right heart pressures were noted to be normal.  He underwent a CT angiogram of the chest which confirmed findings of a sending aneurysm at 4.7 cm.  Dr. Virgel Gess evaluated patient and studies and agreed recommendations to proceed with  surgical intervention and he was admitted this hospitalization for the procedure.  Discharged Condition: good  Hospital Course: The patient was admitted electively and on 02/19/2018 taken to the operating room where he underwent the below described procedure.  He tolerated it well and was taken to the surgical intensive care unit in stable condition.  Postoperative hospital course:  The patient is overall progressed well.  He initially did require some low-dose neo-and dopamine and this was weaned without difficulty.  He does have some postoperative volume overload but is responding to diuretics.  He has been started on low-dose Lopressor and will be uptitrated as able.  He does have an expected acute blood loss anemia and has been started on iron and folate supplement.  Most recent hemoglobin and hematocrit are 8.2/25.9.  Renal function has remained within normal limits and most recent values are 14/0.69.  Blood sugars have been under good control hemoglobin A1c on 02/13/2018 was 5.2.  He does have some right grip weakness due to brachial plexus stretch.  This should improve over time.  All routine lines, monitors and drainage devices have been discontinued in the standard fashion.  He is started on a course of routine cardiac rehab modalities as well as physical therapy and Occupational Therapy consults. He is felt to be stable for discharge on today's date  Consults: None  Significant Diagnostic Studies: Routine postoperative serial labs and chest x-rays.  Treatments: surgery:   02/19/2018  Surgeon:  Alleen Borne, MD  First Assistant: Gershon Crane,  PA-C. Vein harvesting and first assisting for the entire procedure until  cross clamp removed.    Preoperative Diagnosis:  Severe 3-vessel coronary artery disease, severe aortic stenosis, 4.7 cm ascending aortic aneurysm.   Postoperative Diagnosis:  Same   Procedure:  1. Median Sternotomy 2. Extracorporeal circulation 3.    Replacement of the ascending aorta (hemi-arch) using a 30 mm Hemashield graft under deep hypothermic circulatory arrest 4.   Aortic valve replacement using a 25 mm INSPIRIS RESILIA aortic valve 5.   Wheat procedure (supra-coronary aortic anastomosis). 6.   Coronary artery bypass graft surgery x 3              Left internal mammary artery graft to the LAD             SVG to OM             SVG to PL (RCA)  7.   Open harvest of left greater saphenous vein   Anesthesia:  General Endotracheal   Discharge Exam: Blood pressure (!) 118/96, pulse 92, temperature 97.9 F (36.6 C), temperature source Oral, resp. rate 20, height 5\' 7"  (1.702 m), weight 99.6 kg (219 lb 9.6 oz), SpO2 96 %.  General appearance: alert, cooperative and no distress Heart: regular rate and rhythm, S1, S2 normal, no murmur, click, rub or gallop Lungs: clear to auscultation bilaterally Abdomen: soft, non-tender; bowel sounds normal; no masses,  no organomegaly Extremities: extremities normal, atraumatic, no cyanosis or edema Wound: clean and dry   Disposition: Discharge disposition: 01-Home or Self Care      nursing facility  Discharge Instructions    Discharge patient   Complete by:  As directed    Discharge disposition:  01-Home or Self Care   Discharge patient date:  02/25/2018     Allergies as of 02/25/2018   No Known Allergies     Medication List    STOP taking these medications   aspirin 81 MG chewable tablet Replaced by:  aspirin 325 MG EC tablet   clopidogrel 75 MG tablet Commonly known as:  PLAVIX   diphenhydrAMINE 25 MG tablet Commonly known as:  BENADRYL   naproxen sodium 220 MG tablet Commonly known as:  ALEVE     TAKE these medications   aspirin 325 MG EC tablet Take 1 tablet (325 mg total) by mouth daily. Replaces:  aspirin 81 MG chewable tablet   atorvastatin 40 MG tablet Commonly known as:  LIPITOR Take 1 tablet (40 mg total) by mouth daily at 6 PM. What changed:     medication strength  how much to take   metoprolol tartrate 25 MG tablet Commonly known as:  LOPRESSOR Take 1 tablet (25 mg total) by mouth 2 (two) times daily.   oxyCODONE 5 MG immediate release tablet Commonly known as:  Oxy IR/ROXICODONE Take 1-2 tablets (5-10 mg total) by mouth every 6 (six) hours as needed for up to 7 days for severe pain.   pantoprazole 40 MG tablet Commonly known as:  PROTONIX Take 1 tablet (40 mg total) by mouth daily. What changed:    when to take this  reasons to take this      Follow-up Information    Bartle, Payton Doughty, MD Follow up.   Specialty:  Cardiothoracic Surgery Why:  Please see discharge paperwork.  On the day see Dr. Laneta Simmers obtain a chest x-ray 1/2-hour prior to appointment at [facility] imaging located in the same office complex on the first floor. Contact information: [address] [employer] [address] [place] [address] [phone]  Lyn RecordsSmith, Henry W, MD Follow up.   Specialty:  Cardiology Why:  Please see discharge paperwork for follow-up appointment with cardiology in 2 weeks. Contact information: [address] [place] [address] [phone]          The patient has been discharged on:   1.Beta Blocker:  Yes Cove.Etienne[y   ]                              No   [   ]                              If No, reason:  2.Ace Inhibitor/ARB: Yes [   ]                                     No  [    ]                                     If No, reason:labile bo  3.Statin:   Yes [ y  ]                  No  [   ]                  If No, reason:  4.Ecasa:  Yes  Cove.Etienne[y   ]                  No   [   ]                  If No, reason:  Signed: Glenice LaineWayne E Kaitlan Bin 02/25/2018, 7:36 AM

## 2018-02-22 NOTE — Progress Notes (Signed)
3 Days Post-Op Procedure(s) (LRB): CORONARY ARTERY BYPASS GRAFTING (CABG)x3. LIMA TO LAD. SVG TO PL. SVG to OM. SAPHENOUS VEIN HARVEST. (N/A) BENTALL PROCEDURE (N/A) REPLACEMENT ASCENDING AORTA (N/A) TRANSESOPHAGEAL ECHOCARDIOGRAM (TEE) (N/A) Subjective: No complaints. Ambulated this am. Passing flatus but no BM yet. Ate breakfast.  Objective: Vital signs in last 24 hours: Temp:  [98 F (36.7 C)-98.7 F (37.1 C)] 98 F (36.7 C) (07/12 0805) Pulse Rate:  [84-110] 110 (07/12 0800) Cardiac Rhythm: Normal sinus rhythm (07/12 0730) Resp:  [18-33] 23 (07/12 0800) BP: (97-143)/(56-94) 111/94 (07/12 0800) SpO2:  [91 %-100 %] 96 % (07/12 0800) Weight:  [103.6 kg (228 lb 6.3 oz)] 103.6 kg (228 lb 6.3 oz) (07/12 0500)  Hemodynamic parameters for last 24 hours:    Intake/Output from previous day: 07/11 0701 - 07/12 0700 In: 1241.1 [P.O.:240; I.V.:915; IV Piggyback:86.1] Out: 4650 [Urine:4650] Intake/Output this shift: Total I/O In: 37.6 [I.V.:8.3; IV Piggyback:29.3] Out: 45 [Urine:45]  General appearance: alert and cooperative Neurologic: right grip weakness but improved. Heart: regular rate and rhythm, S1, S2 normal, no murmur, click, rub or gallop Lungs: clear to auscultation bilaterally Abdomen: soft, non-tender; bowel sounds normal; no masses,  no organomegaly Extremities: edema mild Wound: incisions ok  Lab Results: Recent Labs    02/21/18 0338 02/22/18 0325  WBC 14.7* 11.6*  HGB 8.5* 7.5*  HCT 26.7* 23.6*  PLT 117* 104*   BMET:  Recent Labs    02/21/18 0338 02/22/18 0325  NA 136 135  K 4.4 3.5  CL 103 97*  CO2 26 30  GLUCOSE 151* 121*  BUN 24* 22*  CREATININE 1.12 0.90  CALCIUM 8.4* 8.5*    PT/INR:  Recent Labs    02/19/18 1848  LABPROT 17.1*  INR 1.41   ABG    Component Value Date/Time   PHART 7.363 02/20/2018 0154   HCO3 25.0 02/20/2018 0154   TCO2 22 02/20/2018 1622   ACIDBASEDEF 3.0 (H) 02/19/2018 1851   O2SAT 99.0 02/20/2018 0154   CBG  (last 3)  Recent Labs    02/21/18 1529 02/21/18 2121 02/22/18 0621  GLUCAP 135* 94 84   CXR: mild left base atelectasis.  Assessment/Plan: S/P Procedure(s) (LRB): CORONARY ARTERY BYPASS GRAFTING (CABG)x3. LIMA TO LAD. SVG TO PL. SVG to OM. SAPHENOUS VEIN HARVEST. (N/A) BENTALL PROCEDURE (N/A) REPLACEMENT ASCENDING AORTA (N/A) TRANSESOPHAGEAL ECHOCARDIOGRAM (TEE) (N/A)  POD 3 Hemodynamically stable in sinus rhythm. Continue low dose Lopressor and titrate up as tolerated.  Volume excess improved with diuresis yesterday. Wt is at preop. Still has mild edema.  Expected postop blood loss anemia: start iron and follow. Hgb dropped from yesterday but no visible blood loss. Repeat CBC in am.  Right grip weakness probably due to brachial plexus stretch from sternotomy and right axillary artery exposure. This should continue to improve.  Consult PT, OT, Social work. May need to go to SNF.  DC foley and sleeve.  Transfer to 4E and continue IS, ambulation.     LOS: 4 days    Alleen BorneBryan K Bartle 02/22/2018

## 2018-02-23 LAB — BASIC METABOLIC PANEL
ANION GAP: 8 (ref 5–15)
BUN: 14 mg/dL (ref 6–20)
CHLORIDE: 96 mmol/L — AB (ref 98–111)
CO2: 31 mmol/L (ref 22–32)
CREATININE: 0.65 mg/dL (ref 0.61–1.24)
Calcium: 8.6 mg/dL — ABNORMAL LOW (ref 8.9–10.3)
GFR calc non Af Amer: >60 mL/min (ref >60)
Glucose, Bld: 96 mg/dL (ref 70–99)
POTASSIUM: 3.4 mmol/L — AB (ref 3.5–5.1)
SODIUM: 135 mmol/L (ref 135–145)

## 2018-02-23 LAB — BPAM RBC
BLOOD PRODUCT EXPIRATION DATE: 201907162359
BLOOD PRODUCT EXPIRATION DATE: 201907312359
BLOOD PRODUCT EXPIRATION DATE: 201908012359
BLOOD PRODUCT EXPIRATION DATE: 201908042359
Blood Product Expiration Date: 201907162359
Blood Product Expiration Date: 201908042359
ISSUE DATE / TIME: 201907091025
ISSUE DATE / TIME: 201907091025
ISSUE DATE / TIME: 201907091542
ISSUE DATE / TIME: 201907091542
ISSUE DATE / TIME: 201907111042
ISSUE DATE / TIME: 201907111358
UNIT TYPE AND RH: 9500
UNIT TYPE AND RH: 9500
UNIT TYPE AND RH: 9500
Unit Type and Rh: 9500
Unit Type and Rh: 9500
Unit Type and Rh: 9500

## 2018-02-23 LAB — TYPE AND SCREEN
ABO/RH(D): O NEG
ANTIBODY SCREEN: NEGATIVE
Unit division: 0
Unit division: 0
Unit division: 0
Unit division: 0
Unit division: 0
Unit division: 0

## 2018-02-23 LAB — CBC
HEMATOCRIT: 22.7 % — AB (ref 39.0–52.0)
HEMOGLOBIN: 7.3 g/dL — AB (ref 13.0–17.0)
MCH: 27.9 pg (ref 26.0–34.0)
MCHC: 32.2 g/dL (ref 30.0–36.0)
MCV: 86.6 fL (ref 78.0–100.0)
Platelets: 122 10*3/uL — ABNORMAL LOW (ref 150–400)
RBC: 2.62 MIL/uL — AB (ref 4.22–5.81)
RDW: 14.6 % (ref 11.5–15.5)
WBC: 8.9 10*3/uL (ref 4.0–10.5)

## 2018-02-23 MED ORDER — GUAIFENESIN ER 600 MG PO TB12
1200.0000 mg | ORAL_TABLET | Freq: Two times a day (BID) | ORAL | Status: DC
  Administered 2018-02-23 – 2018-02-25 (×5): 1200 mg via ORAL
  Filled 2018-02-23 (×5): qty 2

## 2018-02-23 MED ORDER — POTASSIUM CHLORIDE CRYS ER 20 MEQ PO TBCR
20.0000 meq | EXTENDED_RELEASE_TABLET | Freq: Two times a day (BID) | ORAL | Status: AC
  Administered 2018-02-23 (×2): 20 meq via ORAL
  Filled 2018-02-23 (×2): qty 1

## 2018-02-23 MED ORDER — METOPROLOL TARTRATE 25 MG PO TABS
25.0000 mg | ORAL_TABLET | Freq: Two times a day (BID) | ORAL | Status: DC
  Administered 2018-02-23 – 2018-02-25 (×4): 25 mg via ORAL
  Filled 2018-02-23 (×4): qty 1

## 2018-02-23 NOTE — Progress Notes (Signed)
OT Cancellation Note  Patient Details Name: Tyler RichterDavid Croson MRN: [id number] DOB: 10/26/1960   Cancelled Treatment:    Reason Eval/Treat Not Completed: Fatigue/lethargy limiting ability to participate.  Pt just returned to bed after PT.  Will reattempt.  Vendela Troung Crandallonarpe, OTR/L [phone]   Jeani HawkingConarpe, Cashtyn Pouliot M 02/23/2018, 3:27 PM

## 2018-02-23 NOTE — Progress Notes (Addendum)
CARDIAC REHAB PHASE I   PRE:  Rate/Rhythm: 98 sinus rhythm  BP:  Supine:   Sitting: 125/85  Standing:    SaO2: 100% ra  MODE:  Ambulation: 500 ft   POST:  Rate/Rhythem: 97 sinus rhythm  BP:  Supine:   Sitting: 128/86  Standing:    SaO2: 98% ra 0940-1020 Pt ambulated in hallway x1 assist, slow steady gait. Pt demonstrated proper chest pillow use, in the tube and sternal precautions reinforced. Pt with productive cough, tenacious sputum difficulty bringing up.  Recommend flutter valve. Pt demonstrated proper IS use 750.  Pt educated to use IS 10x per hour.  Pt returned to chair, call light in reach.  Pt given In the Tube handout.  Pt reports he lives alone.  He states his son plans to stay with him for 2 weeks upon discharge.    CiscoJoann Hamilton Elver Stadler

## 2018-02-23 NOTE — Progress Notes (Addendum)
      [address]       [place] [id number]             [phone]      4 Days Post-Op Procedure(s) (LRB): CORONARY ARTERY BYPASS GRAFTING (CABG)x3. LIMA TO LAD. SVG TO PL. SVG to OM. SAPHENOUS VEIN HARVEST. (N/A) BENTALL PROCEDURE (N/A) REPLACEMENT ASCENDING AORTA (N/A) TRANSESOPHAGEAL ECHOCARDIOGRAM (TEE) (N/A) Subjective: He has not walked in the halls yet. Feels okay this morning. Using his IS when I walked in the room.   Objective: Vital signs in last 24 hours: Temp:  [97.5 F (36.4 C)-99 F (37.2 C)] 99 F (37.2 C) (07/13 0522) Pulse Rate:  [56-116] 96 (07/13 0822) Cardiac Rhythm: Normal sinus rhythm (07/13 0700) Resp:  [18-26] 19 (07/13 0522) BP: (92-132)/(55-81) 128/69 (07/13 0822) SpO2:  [92 %-100 %] 95 % (07/13 0522) Weight:  [101.5 kg (223 lb 12.3 oz)-103.4 kg (227 lb 15.3 oz)] 101.5 kg (223 lb 12.3 oz) (07/13 0522)     Intake/Output from previous day: 07/12 0701 - 07/13 0700 In: 423.8 [P.O.:360; I.V.:34.5; IV Piggyback:29.3] Out: 1450 [Urine:1450] Intake/Output this shift: No intake/output data recorded.  General appearance: alert, cooperative and no distress Heart: regular rate and rhythm, S1, S2 normal, no murmur, click, rub or gallop Lungs: clear to auscultation bilaterally Abdomen: soft, non-tender; bowel sounds normal; no masses,  no organomegaly Extremities: right hand edema.  Wound: clean and dry  Lab Results: Recent Labs    02/22/18 0325 02/23/18 0339  WBC 11.6* 8.9  HGB 7.5* 7.3*  HCT 23.6* 22.7*  PLT 104* 122*   BMET:  Recent Labs    02/22/18 0325 02/23/18 0339  NA 135 135  K 3.5 3.4*  CL 97* 96*  CO2 30 31  GLUCOSE 121* 96  BUN 22* 14  CREATININE 0.90 0.65  CALCIUM 8.5* 8.6*    PT/INR: No results for input(s): LABPROT, INR in the last 72 hours. ABG    Component Value Date/Time   PHART 7.363 02/20/2018 0154   HCO3 25.0 02/20/2018 0154   TCO2 22 02/20/2018 1622   ACIDBASEDEF 3.0 (H) 02/19/2018 1851   O2SAT 99.0 02/20/2018 0154   CBG (last 3)  Recent Labs    02/22/18 0621 02/22/18 1157 02/22/18 1604  GLUCAP 84 91 87    Assessment/Plan: S/P Procedure(s) (LRB): CORONARY ARTERY BYPASS GRAFTING (CABG)x3. LIMA TO LAD. SVG TO PL. SVG to OM. SAPHENOUS VEIN HARVEST. (N/A) BENTALL PROCEDURE (N/A) REPLACEMENT ASCENDING AORTA (N/A) TRANSESOPHAGEAL ECHOCARDIOGRAM (TEE) (N/A)  1. CV-Remains in NSR in the 90s. BP well controlled. Increase Lopressor to 25mg  BID. Continue ASA and statin 2. Pulm-tolerating room air with good oxygen saturation. CXR yesterday showed small left pleural effusion with atelectasis.  Start mucinex for secretions.  3. Renal-creatinine 0.65. Great diuresis. Below baseline weight. Hypokalemia- will order k-dur 20Meq X 2. 4. H and H is stable at 7.3/22.7, platelets trending up 122k today. Will continue to monitor closely. On Iron supplementation.  5. Endo-blood glucose well controlled on current regimen.  6. Right grip weakness-likely due to brachial plexus stretch, continue to monitor, should improve over time.  7. Continue PT/OT-may need SNF at discharge.       LOS: 5 days    Sharlene Doryessa N Conte 02/23/2018  I have seen and examined Tyler Richteravid Dierks and agree with the above assessment  and plan.  Delight OvensEdward B Gifford Ballon MD Beeper [phone] Office [phone] 02/23/2018 10:53 AM

## 2018-02-23 NOTE — Plan of Care (Signed)
  Problem: Education: Goal: Knowledge of General Education information will improve Outcome: Progressing   Problem: Health Behavior/Discharge Planning: Goal: Ability to manage health-related needs will improve Outcome: Progressing   Problem: Clinical Measurements: Goal: Will remain free from infection Outcome: Progressing Goal: Diagnostic test results will improve Outcome: Progressing   Problem: Activity: Goal: Risk for activity intolerance will decrease Outcome: Progressing   Problem: Elimination: Goal: Will not experience complications related to bowel motility Outcome: Progressing Goal: Will not experience complications related to urinary retention Outcome: Progressing

## 2018-02-23 NOTE — Progress Notes (Signed)
Physical Therapy Treatment Patient Details Name: Tyler Kemp MRN: [id number] DOB: 03/10/1961 Today's Date: 02/23/2018    History of Present Illness 57 y.o. male s/p CABGx3 and AVR 7/09. PMH includes: CAD, aortic stenosis, PNA, Hiatal Hernia, GERD    PT Comments    Pt met in room ambulating towards door with his son. Pt reports that he wanted to walk to be able to have a more productive cough. Pt requires supervision for ambulation of 400 feet with RW. After ambulation of 300 feet pt stopped and was able to cough and produce some phlegm. Pt reported he could breathe better, however was feeling very tired. Pt returned to room and required min guard assist to return to bed. Pt making good progress towards his goals as this is his third bout of walking today.   Follow Up Recommendations  Home health PT;Supervision/Assistance - 24 hour     Equipment Recommendations  (TBD)    Recommendations for Other Services       Precautions / Restrictions Precautions Precautions: Sternal Precaution Booklet Issued: No Precaution Comments: discussed that use of grab bars in his bathroom would be pulling that would violate his precautions Restrictions Weight Bearing Restrictions: Yes Other Position/Activity Restrictions: Sternal     Mobility  Bed Mobility Overal bed mobility: Needs Assistance Bed Mobility: Sit to Supine       Sit to supine: Min guard   General bed mobility comments: min guard for safety, increased effort to bring LE into bed  Transfers Overall transfer level: Needs assistance Equipment used: Rolling walker (2 wheeled)   Sit to Stand: Min guard         General transfer comment: min guard for lowering back into bed  Ambulation/Gait Ambulation/Gait assistance: Supervision Gait Distance (Feet): 400 Feet Assistive device: Rolling walker (2 wheeled) Gait Pattern/deviations: Step-through pattern;Step-to pattern;Decreased stride length Gait velocity: decreased Gait  velocity interpretation: 1.31 - 2.62 ft/sec, indicative of limited community ambulator General Gait Details: slow, steady gait, supervision for safety      Balance Overall balance assessment: No apparent balance deficits (not formally assessed)                                          Cognition Arousal/Alertness: Awake/alert Behavior During Therapy: WFL for tasks assessed/performed Overall Cognitive Status: Within Functional Limits for tasks assessed                                           General Comments General comments (skin integrity, edema, etc.): son present throughout session, VSS, max HR 115 bpm, SaO2 on RA 95%O2      Pertinent Vitals/Pain Pain Assessment: Faces Faces Pain Scale: Hurts little more Pain Location: post op chest incision  Pain Descriptors / Indicators: Discomfort Pain Intervention(s): Limited activity within patient's tolerance;Monitored during session;Repositioned           PT Goals (current goals can now be found in the care plan section) Acute Rehab PT Goals Patient Stated Goal: go home with family support  PT Goal Formulation: With patient Time For Goal Achievement: 03/01/18 Potential to Achieve Goals: Good Progress towards PT goals: Progressing toward goals    Frequency    Min 3X/week      PT Plan Current plan remains appropriate    Co-evaluation  AM-PAC PT "6 Clicks" Daily Activity  Outcome Measure  Difficulty turning over in bed (including adjusting bedclothes, sheets and blankets)?: Unable Difficulty moving from lying on back to sitting on the side of the bed? : Unable Difficulty sitting down on and standing up from a chair with arms (e.g., wheelchair, bedside commode, etc,.)?: Unable Help needed moving to and from a bed to chair (including a wheelchair)?: A Little Help needed walking in hospital room?: A Little Help needed climbing 3-5 steps with a railing? : A Lot 6 Click  Score: 11    End of Session Equipment Utilized During Treatment: Gait belt Activity Tolerance: Patient tolerated treatment well Patient left: in bed;with call bell/phone within reach;with family/visitor present   PT Visit Diagnosis: Unsteadiness on feet (R26.81);Pain;Other abnormalities of gait and mobility (R26.89) Pain - part of body: (Chest)     Time: 4388-8757 PT Time Calculation (min) (ACUTE ONLY): 20 min  Charges:  $Gait Training: 8-22 mins                    G Codes:       Pura Picinich B. Migdalia Dk PT, DPT Acute Rehabilitation  [phone] Pager [phone]     [place] 02/23/2018, 3:47 PM

## 2018-02-24 NOTE — Progress Notes (Signed)
Pt ambulated 41870ft with RN using walker. Pt tolerated well. Pt back in bed with call bell within reach. Will continue to monitor.   William DaltonMegan Rogue Pautler,RN, BSN

## 2018-02-24 NOTE — Progress Notes (Addendum)
      [address]       ,[place] [id number]             [phone]      5 Days Post-Op Procedure(s) (LRB): CORONARY ARTERY BYPASS GRAFTING (CABG)x3. LIMA TO LAD. SVG TO PL. SVG to OM. SAPHENOUS VEIN HARVEST. (N/A) BENTALL PROCEDURE (N/A) REPLACEMENT ASCENDING AORTA (N/A) TRANSESOPHAGEAL ECHOCARDIOGRAM (TEE) (N/A) Subjective: No issues this morning. He is feeling much better today and feels like he can breathe.   Objective: Vital signs in last 24 hours: Temp:  [97.7 F (36.5 C)-98.5 F (36.9 C)] 97.7 F (36.5 C) (07/14 0456) Pulse Rate:  [87-96] 87 (07/14 0456) Cardiac Rhythm: Normal sinus rhythm (07/14 0700) Resp:  [18-22] 18 (07/14 0456) BP: (116-128)/(69-74) 121/74 (07/14 0456) SpO2:  [97 %-100 %] 100 % (07/14 0456) Weight:  [99.8 kg (220 lb)] 99.8 kg (220 lb) (07/14 0456)     Intake/Output from previous day: 07/13 0701 - 07/14 0700 In: 810 [P.O.:810] Out: 1550 [Urine:1550] Intake/Output this shift: No intake/output data recorded.  General appearance: alert, cooperative and no distress Heart: regular rate and rhythm, S1, S2 normal, no murmur, click, rub or gallop Lungs: clear to auscultation bilaterally Abdomen: soft, non-tender; bowel sounds normal; no masses,  no organomegaly Extremities: extremities normal, atraumatic, no cyanosis or edema Wound: clean and dry  Lab Results: Recent Labs    02/22/18 0325 02/23/18 0339  WBC 11.6* 8.9  HGB 7.5* 7.3*  HCT 23.6* 22.7*  PLT 104* 122*   BMET:  Recent Labs    02/22/18 0325 02/23/18 0339  NA 135 135  K 3.5 3.4*  CL 97* 96*  CO2 30 31  GLUCOSE 121* 96  BUN 22* 14  CREATININE 0.90 0.65  CALCIUM 8.5* 8.6*    PT/INR: No results for input(s): LABPROT, INR in the last 72 hours. ABG    Component Value Date/Time   PHART 7.363 02/20/2018 0154   HCO3 25.0 02/20/2018 0154   TCO2 22 02/20/2018 1622   ACIDBASEDEF 3.0 (H) 02/19/2018 1851   O2SAT 99.0 02/20/2018 0154   CBG (last 3)  Recent  Labs    02/22/18 0621 02/22/18 1157 02/22/18 1604  GLUCAP 84 91 87    Assessment/Plan: S/P Procedure(s) (LRB): CORONARY ARTERY BYPASS GRAFTING (CABG)x3. LIMA TO LAD. SVG TO PL. SVG to OM. SAPHENOUS VEIN HARVEST. (N/A) BENTALL PROCEDURE (N/A) REPLACEMENT ASCENDING AORTA (N/A) TRANSESOPHAGEAL ECHOCARDIOGRAM (TEE) (N/A)  1. CV-Remains in NSR in the 80s-90s. BP well controlled. On Lopressor to 25mg  BID. Continue ASA and statin. 2. Pulm-tolerating room air with good oxygen saturation. No recent CXR.  Start mucinex for secretions.  3. Renal-creatinine 0.65. Great diuresis. Below baseline weight. Hypokalemia- replaced 4. H and H is stable at 7.3/22.7, platelets trending up 122k yesterday. Will continue to monitor closely. On Iron supplementation.  5. Endo-blood glucose well controlled on current regimen.  6. Right grip weakness-likely due to brachial plexus stretch, continue to monitor, should improve over time.  7. Continue PT/OT-PT is suggesting home health with home PT.   Plan: Will order labs for tomorrow. Ambulate in the halls. Continue to encourage incentive spirometer use hourly. D/C EPW today.    LOS: 6 days    Sharlene Doryessa N Conte 02/24/2018  Follow up labs Likely home with pt  walking better I have seen and examined Tyler Richteravid Subramanian and agree with the above assessment  and plan.  Delight OvensEdward B Quynn Vilchis MD Beeper [phone] Office [phone] 02/24/2018 12:58 PM

## 2018-02-24 NOTE — Progress Notes (Signed)
Pacing wires removed;tips intact. Pt tolerated well;VSS. Pt educated on bedrest until 10:15am. Will continue to monitor pt.  Tyler DaltonMegan Kalem Rockwell,RN, BSN

## 2018-02-25 LAB — BASIC METABOLIC PANEL
Anion gap: 11 (ref 5–15)
BUN: 14 mg/dL (ref 6–20)
CHLORIDE: 100 mmol/L (ref 98–111)
CO2: 25 mmol/L (ref 22–32)
CREATININE: 0.65 mg/dL (ref 0.61–1.24)
Calcium: 8.8 mg/dL — ABNORMAL LOW (ref 8.9–10.3)
Glucose, Bld: 94 mg/dL (ref 70–99)
POTASSIUM: 4 mmol/L (ref 3.5–5.1)
SODIUM: 136 mmol/L (ref 135–145)

## 2018-02-25 LAB — CBC
HCT: 25.9 % — ABNORMAL LOW (ref 39.0–52.0)
HEMOGLOBIN: 8.2 g/dL — AB (ref 13.0–17.0)
MCH: 27.9 pg (ref 26.0–34.0)
MCHC: 31.7 g/dL (ref 30.0–36.0)
MCV: 88.1 fL (ref 78.0–100.0)
PLATELETS: 185 10*3/uL (ref 150–400)
RBC: 2.94 MIL/uL — AB (ref 4.22–5.81)
RDW: 14.5 % (ref 11.5–15.5)
WBC: 7.2 10*3/uL (ref 4.0–10.5)

## 2018-02-25 MED ORDER — METOPROLOL TARTRATE 25 MG PO TABS: 25.0000 mg | ORAL_TABLET | Freq: Two times a day (BID) | ORAL | 1 refills | Status: DC

## 2018-02-25 MED ORDER — ATORVASTATIN CALCIUM 40 MG PO TABS: 40.0000 mg | ORAL_TABLET | Freq: Every day | ORAL | 1 refills | Status: DC

## 2018-02-25 MED ORDER — ASPIRIN 325 MG PO TBEC: 325.0000 mg | DELAYED_RELEASE_TABLET | Freq: Every day | ORAL | 0 refills | Status: DC

## 2018-02-25 MED ORDER — OXYCODONE HCL 5 MG PO TABS: 5.0000 mg | ORAL_TABLET | Freq: Four times a day (QID) | ORAL | 0 refills | Status: AC | PRN

## 2018-02-25 NOTE — Progress Notes (Addendum)
[address]       [place] [address]             [phone]      6 Days Post-Op Procedure(s) (LRB): CORONARY ARTERY BYPASS GRAFTING (CABG)x3. LIMA TO LAD. SVG TO PL. SVG to OM. SAPHENOUS VEIN HARVEST. (N/A) BENTALL PROCEDURE (N/A) REPLACEMENT ASCENDING AORTA (N/A) TRANSESOPHAGEAL ECHOCARDIOGRAM (TEE) (N/A) Subjective: Feels pretty well, right hand numbness conts to improve  Objective: Vital signs in last 24 hours: Temp:  [97.9 F (36.6 C)] 97.9 F (36.6 C) (07/15 0353) Pulse Rate:  [92] 92 (07/15 0353) Cardiac Rhythm: Normal sinus rhythm (07/15 0704) Resp:  [19-26] 20 (07/15 0353) BP: (109-120)/(69-96) 118/96 (07/15 0353) SpO2:  [95 %-96 %] 96 % (07/15 0353) Weight:  [99.6 kg (219 lb 9.6 oz)] 99.6 kg (219 lb 9.6 oz) (07/15 0353)  Hemodynamic parameters for last 24 hours:    Intake/Output from previous day: 07/14 0701 - 07/15 0700 In: 1200 [P.O.:1200] Out: 850 [Urine:850] Intake/Output this shift: No intake/output data recorded.  General appearance: alert, cooperative and no distress Heart: regular rate and rhythm Lungs: mildly dim in bases Abdomen: benign Extremities: minor edema Wound: incis healing well  Lab Results: Recent Labs    02/23/18 0339 02/25/18 0424  WBC 8.9 7.2  HGB 7.3* 8.2*  HCT 22.7* 25.9*  PLT 122* 185   BMET:  Recent Labs    02/23/18 0339 02/25/18 0424  NA 135 136  K 3.4* 4.0  CL 96* 100  CO2 31 25  GLUCOSE 96 94  BUN 14 14  CREATININE 0.65 0.65  CALCIUM 8.6* 8.8*    PT/INR: No results for input(s): LABPROT, INR in the last 72 hours. ABG    Component Value Date/Time   PHART 7.363 02/20/2018 0154   HCO3 25.0 02/20/2018 0154   TCO2 22 02/20/2018 1622   ACIDBASEDEF 3.0 (H) 02/19/2018 1851   O2SAT 99.0 02/20/2018 0154   CBG (last 3)  Recent Labs    02/22/18 1157 02/22/18 1604  GLUCAP 91 87    Meds Scheduled Meds: . aspirin EC  325 mg Oral Daily  . atorvastatin  40 mg Oral q1800  .  docusate sodium  200 mg Oral Daily  . enoxaparin (LOVENOX) injection  40 mg Subcutaneous QHS  . ferrous fumarate-b12-vitamic C-folic acid  1 capsule Oral TID PC  . guaiFENesin  1,200 mg Oral BID  . mouth rinse  15 mL Mouth Rinse BID  . metoprolol tartrate  25 mg Oral BID  . pantoprazole  40 mg Oral QAC breakfast  . sodium chloride flush  3 mL Intravenous Q12H   Continuous Infusions: . sodium chloride     PRN Meds:.sodium chloride, acetaminophen, bisacodyl **OR** bisacodyl, ondansetron **OR** ondansetron (ZOFRAN) IV, oxyCODONE, sodium chloride flush, traMADol  Xrays No results found.  Assessment/Plan: S/P Procedure(s) (LRB): CORONARY ARTERY BYPASS GRAFTING (CABG)x3. LIMA TO LAD. SVG TO PL. SVG to OM. SAPHENOUS VEIN HARVEST. (N/A) BENTALL PROCEDURE (N/A) REPLACEMENT ASCENDING AORTA (N/A) TRANSESOPHAGEAL ECHOCARDIOGRAM (TEE) (N/A)  1 doing well  2 hemodyn stable in sinus rhythm 3 pulm status is stable on RA 4 H/H improved, platelets improved 5 renal fxn is stable 6 glucose well controlled 7 right hand less numb, good motor function 8 stable for discharge  LOS: 7 days    Tyler Kemp 02/25/2018   Chart reviewed, patient examined, agree with above. He looks good overall. Still has some ulnar distribution numbness in right hand from brachial plexus  stretch but it is improving. I think he looks good to go home today.

## 2018-02-25 NOTE — Care Management Note (Signed)
Case Management Note Tyler PieriniKristi Abbegale Stehle RN, BSN Unit 4E-Case Manager [phone]  Patient Details  Name: Tyler RichterDavid Kemp MRN: [id number] Date of Birth: 02/15/1961  Subjective/Objective:  Pt admitted s/p CABGx3 and Bentall  7/9                Action/Plan: PTA pt lived at home with son, plan is to return home. Notified by bedside RN-Tyler Kemp- that pt's daughter had called with concerns about pt discharge home. CM spoke with pt at bedside- pt alert and oriented. Per pt his son will be at home- works will be in/out- pt also states his sister lives just about a mile down the road and she is also going to come check in on him- sister comes once a week to fill medication box for pt. Pt reports that he has a walker at home if needed. Pt walked 470 ft with cardiac rehab no assistance (would not qualify for any rehab)- pt states he understands he needs someone to be checking on him frequently for the first 2 weeks. Denies any barriers to getting medications or food. - Pt to transition home.   Expected Discharge Date:  02/25/18               Expected Discharge Plan:  Home/Self Care  In-House Referral:  NA  Discharge planning Services  CM Consult  Post Acute Care Choice:  NA Choice offered to:  NA  DME Arranged:    DME Agency:     HH Arranged:    HH Agency:     Status of Service:  Completed, signed off  If discussed at Long Length of Stay Meetings, dates discussed:     Discharge Disposition: home/self care   Additional Comments:  Tyler Kemp, Tyler Cid Hall, RN 02/25/2018, 12:47 PM

## 2018-02-25 NOTE — Plan of Care (Signed)
  Problem: Education: Goal: Knowledge of General Education information will improve Outcome: Progressing   Problem: Health Behavior/Discharge Planning: Goal: Ability to manage health-related needs will improve Outcome: Progressing   Problem: Clinical Measurements: Goal: Will remain free from infection Outcome: Progressing Goal: Respiratory complications will improve Outcome: Progressing   Problem: Nutrition: Goal: Adequate nutrition will be maintained Outcome: Progressing

## 2018-02-25 NOTE — Progress Notes (Signed)
Physical Therapy Treatment Patient Details Name: Tyler Kemp MRN: [id number] DOB: Dec 15, 1960 Today's Date: 02/25/2018    History of Present Illness 57 y.o. male s/p CABGx3 and AVR 7/09. PMH includes: CAD, aortic stenosis, PNA, Hiatal Hernia, GERD    PT Comments    Pt doing very well and able to amb modified independently in hallways. From PT standpoint ready to return home.   Follow Up Recommendations  Supervision - Intermittent;No PT follow up     Equipment Recommendations  None recommended by PT    Recommendations for Other Services       Precautions / Restrictions Precautions Precautions: Sternal    Mobility  Bed Mobility               General bed mobility comments: pt in chair  Transfers Overall transfer level: Modified independent Equipment used: None             General transfer comment: Hands placed on knees for sit to stand without any cuing  Ambulation/Gait Ambulation/Gait assistance: Modified independent (Device/Increase time) Gait Distance (Feet): 400 Feet Assistive device: Rolling walker (2 wheeled);None Gait Pattern/deviations: Step-through pattern;Decreased stride length Gait velocity: decr Gait velocity interpretation: 1.31 - 2.62 ft/sec, indicative of limited community ambulator General Gait Details: Pt with steady gait in hallway with walker and without assistive device amb in room   Stairs             Wheelchair Mobility    Modified Rankin (Stroke Patients Only)       Balance Overall balance assessment: No apparent balance deficits (not formally assessed)                                          Cognition Arousal/Alertness: Awake/alert Behavior During Therapy: WFL for tasks assessed/performed Overall Cognitive Status: Within Functional Limits for tasks assessed                                        Exercises      General Comments        Pertinent Vitals/Pain Pain  Assessment: No/denies pain    Home Living                      Prior Function            PT Goals (current goals can now be found in the care plan section) Progress towards PT goals: Goals met/education completed, patient discharged from PT    Frequency           PT Plan Discharge plan needs to be updated    Co-evaluation              AM-PAC PT "6 Clicks" Daily Activity  Outcome Measure  Difficulty turning over in bed (including adjusting bedclothes, sheets and blankets)?: A Little Difficulty moving from lying on back to sitting on the side of the bed? : A Little Difficulty sitting down on and standing up from a chair with arms (e.g., wheelchair, bedside commode, etc,.)?: A Little Help needed moving to and from a bed to chair (including a wheelchair)?: None Help needed walking in hospital room?: None Help needed climbing 3-5 steps with a railing? : A Little 6 Click Score: 20    End of Session   Activity Tolerance:  Patient tolerated treatment well Patient left: in chair;with call bell/phone within reach   PT Visit Diagnosis: Other abnormalities of gait and mobility (R26.89)     Time: 7955-8316 PT Time Calculation (min) (ACUTE ONLY): 10 min  Charges:  $Gait Training: 8-22 mins                    G Codes:       [facility] PT [place] 02/25/2018, 2:04 PM

## 2018-02-25 NOTE — Evaluation (Signed)
Occupational Therapy Evaluation and Discharge Patient Details Name: Tyler Kemp MRN: [id number] DOB: 25-Apr-1961 Today's Date: 02/25/2018    History of Present Illness 57 y.o. male s/p CABGx3 and AVR 7/09. PMH includes: CAD, aortic stenosis, PNA, Hiatal Hernia, GERD   Clinical Impression   Pt is functioning modified independently in ADL and ADL transfers. Educated in sternal precautions related to ADL and IADL and energy conservation with pt verbalizing understanding. No further OT needs.    Follow Up Recommendations  No OT follow up    Equipment Recommendations  None recommended by OT    Recommendations for Other Services       Precautions / Restrictions Precautions Precautions: Sternal Precaution Comments: educated in sternal precautions related to ADL and IADL      Mobility Bed Mobility               General bed mobility comments: pt in chair  Transfers Overall transfer level: Modified independent Equipment used: None             General transfer comment: pt self cued hands on knees    Balance Overall balance assessment: No apparent balance deficits (not formally assessed)                                         ADL either performed or assessed with clinical judgement   ADL Overall ADL's : Modified independent                                             Vision Baseline Vision/History: No visual deficits       Perception     Praxis      Pertinent Vitals/Pain Pain Assessment: Faces Faces Pain Scale: Hurts a little bit Pain Location: post op chest incision with coughing Pain Descriptors / Indicators: Discomfort Pain Intervention(s): Monitored during session     Hand Dominance Right   Extremity/Trunk Assessment Upper Extremity Assessment Upper Extremity Assessment: Overall WFL for tasks assessed   Lower Extremity Assessment Lower Extremity Assessment: Overall WFL for tasks assessed   Cervical /  Trunk Assessment Cervical / Trunk Assessment: Normal   Communication Communication Communication: No difficulties   Cognition Arousal/Alertness: Awake/alert Behavior During Therapy: WFL for tasks assessed/performed Overall Cognitive Status: Within Functional Limits for tasks assessed                                     General Comments       Exercises     Shoulder Instructions      Home Living Family/patient expects to be discharged to:: Private residence Living Arrangements: Alone Available Help at Discharge: Family;Available 24 hours/day Type of Home: House Home Access: Ramped entrance     Home Layout: One level     Bathroom Shower/Tub: Producer, television/film/video: Handicapped height     Home Equipment: Environmental consultant - 2 wheels;Walker - 4 wheels;Grab bars - toilet;Grab bars - tub/shower;Shower seat - built in          Prior Functioning/Environment Level of Independence: Independent        Comments: working in [employer], driving, living alone independent.         OT Problem List:  OT Treatment/Interventions:      OT Goals(Current goals can be found in the care plan section) Acute Rehab OT Goals Patient Stated Goal: go home with family support   OT Frequency:     Barriers to D/C:            Co-evaluation              AM-PAC PT "6 Clicks" Daily Activity     Outcome Measure Help from another person eating meals?: None Help from another person taking care of personal grooming?: None Help from another person toileting, which includes using toliet, bedpan, or urinal?: None Help from another person bathing (including washing, rinsing, drying)?: None Help from another person to put on and taking off regular upper body clothing?: None Help from another person to put on and taking off regular lower body clothing?: None 6 Click Score: 24   End of Session Equipment Utilized During Treatment: Rolling walker  Activity Tolerance:  Patient tolerated treatment well Patient left: in chair;with call bell/phone within reach  OT Visit Diagnosis: Pain                Time: [id number] OT Time Calculation (min): 17 min Charges:  OT General Charges $OT Visit: 1 Visit OT Evaluation $OT Eval Moderate Complexity: 1 Mod G-Codes:     02/25/2018 Martie RoundJulie Cathey [place], OTR/L Pager: [phone]  Iran PlanasMayberry, [place] BailiffJulie Lynn 02/25/2018, 10:21 AM

## 2018-02-25 NOTE — Progress Notes (Signed)
Pt given AVS handout. Pt verbalized understanding. IV's removed. VSS. Pt waiting for daughter to transport home.

## 2018-02-25 NOTE — Progress Notes (Signed)
CARDIAC REHAB PHASE I   PRE:  Rate/Rhythm: 104 ST PVCs  BP:  Supine:   Sitting: 122/94  Standing:    SaO2: 96%RA  MODE:  Ambulation: 470 ft   POST:  Rate/Rhythm: 118 ST  BP:  Supine:   Sitting: 137/71  Standing:    SaO2: 99%RA 0830-0915 Pt walked 470 ft on RA with rolling walker and no assistance, tolerated well. Stated he has rolling walker at home. To recliner after walk. Discussed CRP 2 and [facility]. Discussed sternal precautions, IS, ex ed and heart healthy diet. Understanding voiced. Encouraged to have sons read materials left.    Luetta Nuttingharlene Jeshurun [place], RN BSN  02/25/2018 9:03 AM

## 2018-03-01 ENCOUNTER — Ambulatory Visit (INDEPENDENT_AMBULATORY_CARE_PROVIDER_SITE_OTHER): Payer: Self-pay

## 2018-03-01 DIAGNOSIS — Z951 Presence of aortocoronary bypass graft: Secondary | ICD-10-CM

## 2018-03-01 DIAGNOSIS — Z4802 Encounter for removal of sutures: Secondary | ICD-10-CM

## 2018-03-01 NOTE — Progress Notes (Signed)
Removed 3 sutures from chest tube incision sites with no signs of infection and patient tolerated well.  

## 2018-03-13 ENCOUNTER — Encounter: Payer: Self-pay | Admitting: Cardiology

## 2018-03-13 ENCOUNTER — Ambulatory Visit (INDEPENDENT_AMBULATORY_CARE_PROVIDER_SITE_OTHER): Payer: BLUE CROSS/BLUE SHIELD | Admitting: Cardiology

## 2018-03-13 VITALS — BP 128/72 | HR 108 | Ht 67.0 in | Wt 216.0 lb

## 2018-03-13 DIAGNOSIS — Z8679 Personal history of other diseases of the circulatory system: Secondary | ICD-10-CM

## 2018-03-13 DIAGNOSIS — Z87891 Personal history of nicotine dependence: Secondary | ICD-10-CM

## 2018-03-13 DIAGNOSIS — Z952 Presence of prosthetic heart valve: Secondary | ICD-10-CM | POA: Diagnosis not present

## 2018-03-13 DIAGNOSIS — Z9889 Other specified postprocedural states: Secondary | ICD-10-CM

## 2018-03-13 DIAGNOSIS — I2510 Atherosclerotic heart disease of native coronary artery without angina pectoris: Secondary | ICD-10-CM

## 2018-03-13 DIAGNOSIS — J90 Pleural effusion, not elsewhere classified: Secondary | ICD-10-CM | POA: Diagnosis not present

## 2018-03-13 HISTORY — DX: Personal history of other diseases of the circulatory system: Z98.890

## 2018-03-13 HISTORY — DX: Personal history of other diseases of the circulatory system: Z86.79

## 2018-03-13 NOTE — Progress Notes (Signed)
Cardiology Office Note:    Date:  03/13/2018   ID:  Tyler Richteravid Notaro, DOB 08/18/1960, MRN [id number]  PCP:  [facility]  Cardiologist:  Garwin Brothersajan R Revankar, MD   Referring MD: [facility]    ASSESSMENT:    1. Coronary artery disease involving native coronary artery of native heart without angina pectoris   2. Ex-smoker   3. S/P AVR   4. S/P aortic aneurysm repair    PLAN:    In order of problems listed above:  1. I reviewed blood work and hospital records extensively.  Patient and daughter-in-law had multiple questions which were answered to their satisfaction.  Secondary prevention stressed to the patient.  Importance of compliance with diet and medications stressed. 2. He has had significant anemia necessitating a transfusion.  His heart rate is elevated and therefore I will go to do blood work including TSH and hemoglobin.  His lung sounds are reduced at the left mid and base areas and I will send him to [facility] today for an x-ray with 2 views. 3. He will be seen in follow-up appointment in a month or earlier if he has any concerns.  Further recommendations will be made based on the findings of the aforementioned test.   Medication Adjustments/Labs and Tests Ordered: Current medicines are reviewed at length with the patient today.  Concerns regarding medicines are outlined above.  No orders of the defined types were placed in this encounter.  No orders of the defined types were placed in this encounter.    No chief complaint on file.    History of Present Illness:    Tyler Kemp is a 57 y.o. male.  The patient has had aortic ascending aortic aneurysm and coronary artery disease and has undergone replacement of the aortic valve with repair of the root.  Patient denies any problems at this time and takes care of activities of daily living.  No chest pain orthopnea or PND.  He is walking within his house on a regular basis.  He denies  any shortness of breath or palpitations or any dizziness.  At the time of my evaluation, the patient is alert awake oriented and in no distress.  Past Medical History:  Diagnosis Date  . Coronary artery disease   . Food impaction of esophagus 12/11/2017  . GERD (gastroesophageal reflux disease)   . Hiatal hernia   . Pneumonia   . PONV (postoperative nausea and vomiting)   . Severe aortic stenosis   . Thoracic aortic aneurysm [facility])     Past Surgical History:  Procedure Laterality Date  . BENTALL PROCEDURE N/A 02/19/2018   Procedure: BENTALL PROCEDURE;  Surgeon: Alleen BorneBartle, Bryan K, MD;  [facility];  Service: Open Heart Surgery;  Laterality: N/A;  RIGHT AXILLARY CANNULATION  CIRC ARREST  BILATERAL RADIAL ARTERIAL LINES  . CHOLECYSTECTOMY    . CORONARY ARTERY BYPASS GRAFT N/A 02/19/2018   Procedure: CORONARY ARTERY BYPASS GRAFTING (CABG)x3. LIMA TO LAD. SVG TO PL. SVG to OM. SAPHENOUS VEIN HARVEST.;  Surgeon: Alleen BorneBartle, Bryan K, MD;  Location: [facility] OR;  Service: Open Heart Surgery;  Laterality: N/A;  . ESOPHAGOGASTRODUODENOSCOPY (EGD) WITH PROPOFOL N/A 12/11/2017   Procedure: ESOPHAGOGASTRODUODENOSCOPY (EGD) WITH PROPOFOL;  Surgeon: Iva BoopGessner, Carl E, MD;  [facility];  Service: Endoscopy;  Laterality: N/A;  . FOREIGN BODY REMOVAL N/A 12/11/2017   Procedure: FOREIGN BODY REMOVAL;  Surgeon: Iva BoopGessner, Carl E, MD;  [facility];  Service: Endoscopy;  Laterality: N/A;  .  MULTIPLE TOOTH EXTRACTIONS    . REPLACEMENT ASCENDING AORTA N/A 02/19/2018   Procedure: REPLACEMENT ASCENDING AORTA;  Surgeon: Alleen Borne, MD;  Location: [facility] OR;  Service: Open Heart Surgery;  Laterality: N/A;  . RIGHT/LEFT HEART CATH AND CORONARY ANGIOGRAPHY N/A 12/04/2017   Procedure: RIGHT/LEFT HEART CATH AND CORONARY ANGIOGRAPHY;  Surgeon: Lyn Records, MD;  Location: [facility] INVASIVE CV LAB;  Service: Cardiovascular;  Laterality: N/A;  . TEE WITHOUT CARDIOVERSION N/A 02/19/2018   Procedure: TRANSESOPHAGEAL  ECHOCARDIOGRAM (TEE);  Surgeon: Alleen Borne, MD;  Location[facility] OR;  Service: Open Heart Surgery;  Laterality: N/A;    Current Medications: Current Meds  Medication Sig  . aspirin EC 325 MG EC tablet Take 1 tablet (325 mg total) by mouth daily.  Marland Kitchen atorvastatin (LIPITOR) 40 MG tablet Take 1 tablet (40 mg total) by mouth daily at 6 PM.  . metoprolol tartrate (LOPRESSOR) 25 MG tablet Take 1 tablet (25 mg total) by mouth 2 (two) times daily.  . pantoprazole (PROTONIX) 40 MG tablet Take 1 tablet (40 mg total) by mouth daily. (Patient taking differently: Take 40 mg by mouth daily as needed (acid reflux). )     Allergies:   Patient has no known allergies.   Social History   Socioeconomic History  . Marital status: Single    Spouse name: Not on file  . Number of children: Not on file  . Years of education: Not on file  . Highest education level: Not on file  Occupational History  . Not on file  Social Needs  . Financial resource strain: Not on file  . Food insecurity:    Worry: Not on file    Inability: Not on file  . Transportation needs:    Medical: Not on file    Non-medical: Not on file  Tobacco Use  . Smoking status: Former Smoker    Types: Cigarettes    Last attempt to quit: 09/14/2017    Years since quitting: 0.4  . Smokeless tobacco: Never Used  Substance and Sexual Activity  . Alcohol use: Yes    Comment: occasional on weekends  . Drug use: Never  . Sexual activity: Not on file  Lifestyle  . Physical activity:    Days per week: Not on file    Minutes per session: Not on file  . Stress: Not on file  Relationships  . Social connections:    Talks on phone: Not on file    Gets together: Not on file    Attends religious service: Not on file    Active member of club or organization: Not on file    Attends meetings of clubs or organizations: Not on file    Relationship status: Not on file  Other Topics Concern  . Not on file  Social History Narrative  . Not on  file     Family History: The patient's family history includes Diabetes in his mother; Throat cancer in his father.  ROS:   Please see the history of present illness.    All other systems reviewed and are negative.  EKGs/Labs/Other Studies Reviewed:    The following studies were reviewed today: EKG reveals sinus rhythm with nonspecific ST-T changes.   Recent Labs: 11/20/2017: TSH 1.890 02/13/2018: ALT 19 02/20/2018: Magnesium 2.6 02/25/2018: BUN 14; Creatinine, Ser 0.65; Hemoglobin 8.2; Platelets 185; Potassium 4.0; Sodium 136  Recent Lipid Panel    Component Value Date/Time   CHOL 223 (H) 11/20/2017 1507   TRIG 260 (  H) 11/20/2017 1507   HDL 53 11/20/2017 1507   CHOLHDL 4.2 11/20/2017 1507   LDLCALC 118 (H) 11/20/2017 1507    Physical Exam:    VS:  BP 128/72 (BP Location: Right Arm, Patient Position: Sitting, Cuff Size: Normal)   Pulse (!) 108   Ht 5\' 7"  (1.702 m)   Wt 216 lb (98 kg)   SpO2 98%   BMI 33.83 kg/m     Wt Readings from Last 3 Encounters:  03/13/18 216 lb (98 kg)  02/25/18 219 lb 9.6 oz (99.6 kg)  02/13/18 228 lb 1 oz (103.4 kg)     GEN: Patient is in no acute distress HEENT: Normal NECK: No JVD; No carotid bruits LYMPHATICS: No lymphadenopathy CARDIAC: Hear sounds regular, 2/6 systolic murmur at the apex. RESPIRATORY:  Clear to auscultation without rales, the breath sounds are reduced in the left mid and lower lung fields and no wheezing or rhonchi  ABDOMEN: Soft, non-tender, non-distended MUSCULOSKELETAL:  No edema; No deformity  SKIN: Warm and dry NEUROLOGIC:  Alert and oriented x 3 PSYCHIATRIC:  Normal affect   Signed, Garwin Brothers, MD  03/13/2018 11:09 AM    [facility]

## 2018-03-13 NOTE — Patient Instructions (Signed)
Medication Instructions:  Your physician recommends that you continue on your current medications as directed. Please refer to the Current Medication list given to you today.   Labwork: Your physician recommends that you return for lab work today: CBC, TSH, and BMP.   Testing/Procedures:  A chest x-ray takes a picture of the organs and structures inside the chest, including the heart, lungs, and blood vessels. This test can show several things, including, whether the heart is enlarges; whether fluid is building up in the lungs; and whether pacemaker / defibrillator leads are still in place.  Follow-Up: Your physician recommends that you schedule a follow-up appointment in: 1 month.  Any Other Special Instructions Will Be Listed Below (If Applicable).     If you need a refill on your cardiac medications before your next appointment, please call your pharmacy.   Chest X-Ray A chest X-ray is a painless test that uses radiation to create images of the structures inside of your chest. Chest X-rays are used to look for many health conditions, including heart failure, pneumonia, tuberculosis, rib fractures, breathing disorders, and cancer. They may be used to diagnose chest pain, constant coughing, or trouble breathing. Tell a health care provider about:  Any allergies you have.  All medicines you are taking, including vitamins, herbs, eye drops, creams, and over-the-counter medicines.  Any surgeries you have had.  Any medical conditions you have.  Whether you are pregnant or may be pregnant. What are the risks? Getting a chest X-ray is a safe procedure. However, you will be exposed to a small amount of radiation. Being exposed to too much radiation over a lifetime can increase the risk of cancer. This risk is small, but it may occur if you have many X-rays throughout your life. What happens before the procedure?  You may be asked to remove glasses, jewelry, and any other metal  objects.  You will be asked to undress from the waist up. You may be given a hospital gown to wear.  You may be asked to wear a protective lead apron to protect parts of your body from radiation. What happens during the procedure?  You will be asked to stand still as each picture is taken to get the best possible images.  You will be asked to take a deep breath and hold your breath for a few seconds.  The X-ray machine will create a picture of your chest using a tiny burst of radiation. This is painless.  More pictures may be taken from other angles. Typically, one picture will be taken while you face the X-ray camera, and another picture will be taken from the side while you stand. If you cannot stand, you may be asked to lie down. The procedure may vary among health care providers and hospitals. What happens after the procedure?  The X-ray(s) will be reviewed by your health care provider or an X-ray (radiology) specialist.  It is up to you to get your test results. Ask your health care provider, or the department that is doing the test, when your results will be ready.  Your health care provider will tell you if you need more tests or a follow-up exam. Keep all follow-up visits as told by your health care provider. This is important. Summary  A chest X-ray is a safe, painless test that is used to examine the inside of the chest, heart, and lungs.  You will need to undress from the waist up and remove jewelry and metal objects before  the procedure.  You will be exposed to a small amount of radiation during the procedure.  The X-ray machine will take one or more pictures of your chest while you remain as still as possible.  Later, a health care provider or specialist will review the test results with you. This information is not intended to replace advice given to you by your health care provider. Make sure you discuss any questions you have with your health care provider. Document  Released: 09/26/2016 Document Revised: 09/26/2016 Document Reviewed: 09/26/2016 Elsevier Interactive Patient Education  [employer]2018 Elsevier Inc.

## 2018-03-14 ENCOUNTER — Encounter: Payer: Self-pay | Admitting: Emergency Medicine

## 2018-03-14 LAB — CBC
HEMOGLOBIN: 10 g/dL — AB (ref 13.0–17.7)
Hematocrit: 32.3 % — ABNORMAL LOW (ref 37.5–51.0)
MCH: 26 pg — ABNORMAL LOW (ref 26.6–33.0)
MCHC: 31 g/dL — ABNORMAL LOW (ref 31.5–35.7)
MCV: 84 fL (ref 79–97)
PLATELETS: 368 10*3/uL (ref 150–450)
RBC: 3.85 x10E6/uL — AB (ref 4.14–5.80)
RDW: 15.4 % (ref 12.3–15.4)
WBC: 6.9 10*3/uL (ref 3.4–10.8)

## 2018-03-14 LAB — BASIC METABOLIC PANEL
BUN/Creatinine Ratio: 15 (ref 9–20)
BUN: 12 mg/dL (ref 6–24)
CALCIUM: 9 mg/dL (ref 8.7–10.2)
CO2: 22 mmol/L (ref 20–29)
Chloride: 100 mmol/L (ref 96–106)
Creatinine, Ser: 0.78 mg/dL (ref 0.76–1.27)
GFR calc Af Amer: 115 mL/min/{1.73_m2} (ref >59)
GFR calc non Af Amer: 99 mL/min/{1.73_m2} (ref >59)
GLUCOSE: 115 mg/dL — AB (ref 65–99)
POTASSIUM: 3.9 mmol/L (ref 3.5–5.2)
Sodium: 137 mmol/L (ref 134–144)

## 2018-03-14 LAB — TSH: TSH: 1.13 u[IU]/mL (ref 0.450–4.500)

## 2018-03-18 ENCOUNTER — Telehealth: Payer: Self-pay | Admitting: *Deleted

## 2018-03-18 NOTE — Telephone Encounter (Signed)
Patient's daughter-in-law, Lowanda FosterBrittany, informed of chest xray results per DPR. [place] verbalized understanding. No further questions. Sent copy to cardiothoracic surgery.

## 2018-03-20 ENCOUNTER — Other Ambulatory Visit: Payer: Self-pay | Admitting: Surgery

## 2018-03-20 DIAGNOSIS — I25119 Atherosclerotic heart disease of native coronary artery with unspecified angina pectoris: Secondary | ICD-10-CM

## 2018-03-25 ENCOUNTER — Ambulatory Visit (INDEPENDENT_AMBULATORY_CARE_PROVIDER_SITE_OTHER): Payer: Self-pay | Admitting: Surgical

## 2018-03-25 ENCOUNTER — Ambulatory Visit
Admission: RE | Admit: 2018-03-25 | Discharge: 2018-03-25 | Disposition: A | Discharge status: Home / Self Care | Payer: BLUE CROSS/BLUE SHIELD | Source: Ambulatory Visit | Attending: Surgery | Admitting: Surgery

## 2018-03-25 DIAGNOSIS — J90 Pleural effusion, not elsewhere classified: Secondary | ICD-10-CM | POA: Diagnosis not present

## 2018-03-25 DIAGNOSIS — I25119 Atherosclerotic heart disease of native coronary artery with unspecified angina pectoris: Secondary | ICD-10-CM

## 2018-03-25 DIAGNOSIS — Z952 Presence of prosthetic heart valve: Secondary | ICD-10-CM

## 2018-03-25 MED ORDER — GABAPENTIN 100 MG PO CAPS: 100.0000 mg | ORAL_CAPSULE | Freq: Three times a day (TID) | ORAL | 1 refills | Status: DC

## 2018-03-25 NOTE — Patient Instructions (Signed)
discussed instructions in regard to range of motion activities for the right arm and hand.

## 2018-03-25 NOTE — Assessment & Plan Note (Signed)
Difficulty with right arm and hand due to brachial stretch injury versus other etiology that may require further evaluation

## 2018-03-25 NOTE — Progress Notes (Signed)
[address]       [place] [id number]             [phone]      Remonia Rich[facility]David [facility] Medical Record #[id number] Date of Birth: 07/26/1961  Referring: Lyn RecordsSmith, Henry W, MD [facility]: [facility] Primary Cardiologist: No primary care provider on file.   Chief Complaint:   POST OP FOLLOW UP CARDIOVASCULAR SURGERY OPERATIVE NOTE  02/19/2018  Surgeon:  Alleen BorneBryan K. Bartle, MD  First Assistant: Gershon CraneWayne Kingdavid Leinbach,  PA-C. Vein harvesting and first assisting for the entire procedure until cross clamp removed.    Preoperative Diagnosis:  Severe 3-vessel coronary artery disease, severe aortic stenosis, 4.7 cm ascending aortic aneurysm.   Postoperative Diagnosis:  Same   Procedure:  1. Median Sternotomy 2. Extracorporeal circulation 3.   Replacement of the ascending aorta (hemi-arch) using a 30 mm Hemashield graft under deep hypothermic circulatory arrest 4.   Aortic valve replacement using a 25 mm INSPIRIS RESILIA aortic valve 5.   Wheat procedure (supra-coronary aortic anastomosis). 6.   Coronary artery bypass graft surgery x 3              Left internal mammary artery graft to the LAD             SVG to OM             SVG to PL (RCA)  7.   Open harvest of left greater saphenous vein   Anesthesia:  General Endotracheal       History of Present Illness:    The patient is a 57 year old male status post the above described procedure seen in the office on today's date and routine postsurgical follow-up.  Overall he feels as though he is making excellent progress however, he continues to have some difficulty with his right hand.  He is experiencing pain as well as numbness and is having difficulty with motor function.  This is not improved with time and is affecting him on a daily basis including ability to sleep.  He denies fevers, chills or other issues constitutional symptoms.  He is not having shortness of breath.  He has  not had any difficulties with his incisions.  He has been ambulating well.  He has not had palpitations.  He does not have lower extremity edema.      Past Medical History:  Diagnosis Date  . Coronary artery disease   . Food impaction of esophagus 12/11/2017  . GERD (gastroesophageal reflux disease)   . Hiatal hernia   . Pneumonia   . PONV (postoperative nausea and vomiting)   . Severe aortic stenosis   . Thoracic aortic aneurysm [facility](HCC)      Social History   Tobacco Use  Smoking Status Former Smoker  . Types: Cigarettes  . Last attempt to quit: 09/14/2017  . Years since quitting: 0.5  Smokeless Tobacco Never Used    Social History   Substance and Sexual Activity  Alcohol Use Yes   Comment: occasional on weekends     No Known Allergies  Current Outpatient Medications  Medication Sig Dispense Refill  . aspirin EC 325 MG EC tablet Take 1 tablet (325 mg total) by mouth daily. 30 tablet 0  . atorvastatin (LIPITOR) 40 MG tablet Take 1 tablet (40 mg total) by mouth daily at 6 PM. 30 tablet 1  . metoprolol tartrate (LOPRESSOR) 25 MG tablet Take 1 tablet (25 mg total) by mouth  2 (two) times daily. 60 tablet 1  . pantoprazole (PROTONIX) 40 MG tablet Take 1 tablet (40 mg total) by mouth daily. (Patient taking differently: Take 40 mg by mouth daily as needed (acid reflux). ) 90 tablet 3   No current facility-administered medications for this visit.        Physical Exam: BP 140/80   Pulse 80   Resp 20   Ht 5\' 7"  (1.702 m)   Wt 97.5 kg   SpO2 98% Comment: RA  BMI 33.67 kg/m   General appearance: alert, cooperative and no distress Heart: regular rate and rhythm Lungs: Slightly diminished in the left base Abdomen: Benign exam Extremities: No edema Wound: Incisions healing well without evidence of infection.  There is no obvious pulsatility in the axillary incision. Peripheral pulses are intact bilateral upper extremities.  Blood pressure on the right side is 129/85 and  left side is 117/84.  Diagnostic Studies & Laboratory data:     Recent Radiology Findings:   Dg Chest 2 View  Result Date: 03/25/2018 CLINICAL DATA:  Chest and upper extremity pain on the right. Status post Bentall procedure EXAM: CHEST - 2 VIEW COMPARISON:  None. FINDINGS: There is a probable nipple shadow on the right. There is scarring in the left base with small left pleural effusion. Lungs elsewhere are clear. Heart is upper normal in size with pulmonary vascularity normal. Patient is status post aortic valve replacement. Surgical clips are noted in the anterior mediastinum. No evident bone lesions. No pneumothorax. IMPRESSION: Suspect nipple shadow on the right; repeat study with nipple markers advised to confirm. Scarring left base with small left pleural effusion. Lungs elsewhere clear. Heart size upper normal with postoperative changes as noted. No evident adenopathy. Electronically Signed   By: Bretta BangWilliam  Woodruff III M.D.   On: 03/25/2018 14:24      Recent Lab Findings: Lab Results  Component Value Date   WBC 6.9 03/13/2018   HGB 10.0 (L) 03/13/2018   HCT 32.3 (L) 03/13/2018   PLT 368 03/13/2018   GLUCOSE 115 (H) 03/13/2018   CHOL 223 (H) 11/20/2017   TRIG 260 (H) 11/20/2017   HDL 53 11/20/2017   LDLCALC 118 (H) 11/20/2017   ALT 19 02/13/2018   AST 20 02/13/2018   NA 137 03/13/2018   K 3.9 03/13/2018   CL 100 03/13/2018   CREATININE 0.78 03/13/2018   BUN 12 03/13/2018   CO2 22 03/13/2018   TSH 1.130 03/13/2018   INR 1.41 02/19/2018   HGBA1C 5.2 02/13/2018      Assessment / Plan: Patient is overall progressing well however the right upper extremity difficulty remains concerning.  This does not appear to be solely a brachial plexus stretch injury although that is certainly a possibility.  We will start the patient on low-dose gabapentin to try to give him some pain relief.  Also instructed him on some routine range of motion and strengthening exercises for the upper  extremity.  We will see him in the office in 1 to 2 weeks to recheck as he may require further diagnostic evaluation to include rule out nerve injury with nerve conduction studies or possibly vascular component of the issue with a CTA.          Rowe ClackWayne E Shakaya Bhullar, PA-C 03/25/2018 2:37 PM Pager [phone]

## 2018-04-10 ENCOUNTER — Ambulatory Visit: Payer: Self-pay | Admitting: Surgery

## 2018-04-10 ENCOUNTER — Encounter: Payer: Self-pay | Admitting: Cardiology

## 2018-04-10 ENCOUNTER — Ambulatory Visit (INDEPENDENT_AMBULATORY_CARE_PROVIDER_SITE_OTHER): Payer: BLUE CROSS/BLUE SHIELD | Admitting: Cardiology

## 2018-04-10 VITALS — BP 118/72 | HR 83 | Ht 67.0 in | Wt 220.0 lb

## 2018-04-10 DIAGNOSIS — I2510 Atherosclerotic heart disease of native coronary artery without angina pectoris: Secondary | ICD-10-CM | POA: Diagnosis not present

## 2018-04-10 DIAGNOSIS — Z952 Presence of prosthetic heart valve: Secondary | ICD-10-CM

## 2018-04-10 DIAGNOSIS — Z87891 Personal history of nicotine dependence: Secondary | ICD-10-CM | POA: Diagnosis not present

## 2018-04-10 DIAGNOSIS — E782 Mixed hyperlipidemia: Secondary | ICD-10-CM

## 2018-04-10 HISTORY — DX: Mixed hyperlipidemia: E78.2

## 2018-04-10 NOTE — Patient Instructions (Signed)
Medication Instructions:  Your physician recommends that you continue on your current medications as directed. Please refer to the Current Medication list given to you today.  Labwork: Your physician recommends that you have the following labs drawn: Please return in 6 weeks fasting (only water for 9 hours prior) for BMP, CBC, liver and lipid panel.  Testing/Procedures: None  Follow-Up: Your physician recommends that you schedule a follow-up appointment in: 6 months  Any Other Special Instructions Will Be Listed Below (If Applicable).     If you need a refill on your cardiac medications before your next appointment, please call your pharmacy.   [facility]  Garey HamAshley A, RN, BSN

## 2018-04-10 NOTE — Progress Notes (Signed)
Cardiology Office Note:    Date:  04/10/2018   ID:  Tyler Kemp, DOB 06/18/1961, MRN [id number]  PCP:  Practice, [facility]  Cardiologist:  Garwin Brothers, MD   Referring MD: Practice, [facility]*    ASSESSMENT:    1. Coronary artery disease involving native coronary artery of native heart without angina pectoris   2. Ex-smoker   3. S/P AVR   4. Mixed dyslipidemia    PLAN:    In order of problems listed above:  1. Secondary prevention stressed to the patient.  Importance of compliance with diet and medications stressed and he vocalized understanding. 2. His blood pressure is stable.  His blood work was discussed.  I told him to talk to his primary care physician about his anemia.  His most likely this is related to his surgery and hopefully gets better.  I told him to make sure he gets full rich in iron and get supplementation from his primary care physician if necessary. 3. Patient will be seen in follow-up appointment in 6 months or earlier if the patient has any concerns 4. He will also have blood work in 6-weeks which will be fasting including liver lipid check.   Medication Adjustments/Labs and Tests Ordered: Current medicines are reviewed at length with the patient today.  Concerns regarding medicines are outlined above.  No orders of the defined types were placed in this encounter.  No orders of the defined types were placed in this encounter.    No chief complaint on file.    History of Present Illness:    Tyler Kemp is a 57 y.o. male.  Patient underwent aortic root repair for aneurysm changes.  Patient also underwent bioprosthetic aortic valve replacement.  Subsequently is done fine.  No chest pain orthopnea or PND.  He feels much better.  He has had issues with his right hand with some weakness and mild swelling.  He is in touch with his cardiac surgeon in their office about this for further evaluation and possibly nerve conduction  studies.  Past Medical History:  Diagnosis Date  . Coronary artery disease   . Food impaction of esophagus 12/11/2017  . GERD (gastroesophageal reflux disease)   . Hiatal hernia   . Pneumonia   . PONV (postoperative nausea and vomiting)   . Severe aortic stenosis   . Thoracic aortic aneurysm [facility])     Past Surgical History:  Procedure Laterality Date  . BENTALL PROCEDURE N/A 02/19/2018   Procedure: BENTALL PROCEDURE;  Surgeon: Alleen Borne, MD;  Location: [facility] OR;  Service: Open Heart Surgery;  Laterality: N/A;  RIGHT AXILLARY CANNULATION  CIRC ARREST  BILATERAL RADIAL ARTERIAL LINES  . CHOLECYSTECTOMY    . CORONARY ARTERY BYPASS GRAFT N/A 02/19/2018   Procedure: CORONARY ARTERY BYPASS GRAFTING (CABG)x3. LIMA TO LAD. SVG TO PL. SVG to OM. SAPHENOUS VEIN HARVEST.;  Surgeon: Alleen Borne, MD;  Location: [facility] OR;  Service: Open Heart Surgery;  Laterality: N/A;  . ESOPHAGOGASTRODUODENOSCOPY (EGD) WITH PROPOFOL N/A 12/11/2017   Procedure: ESOPHAGOGASTRODUODENOSCOPY (EGD) WITH PROPOFOL;  Surgeon: Iva Boop, MD;  Location: [facility] ENDOSCOPY;  Service: Endoscopy;  Laterality: N/A;  . FOREIGN BODY REMOVAL N/A 12/11/2017   Procedure: FOREIGN BODY REMOVAL;  Surgeon: Iva Boop, MD;  Location: [facility] ENDOSCOPY;  Service: Endoscopy;  Laterality: N/A;  . MULTIPLE TOOTH EXTRACTIONS    . REPLACEMENT ASCENDING AORTA N/A 02/19/2018   Procedure: REPLACEMENT ASCENDING AORTA;  Surgeon: Alleen Borne, MD;  Location:  [facility] OR;  Service: Open Heart Surgery;  Laterality: N/A;  . RIGHT/LEFT HEART CATH AND CORONARY ANGIOGRAPHY N/A 12/04/2017   Procedure: RIGHT/LEFT HEART CATH AND CORONARY ANGIOGRAPHY;  Surgeon: Lyn RecordsSmith, Henry W, MD;  Location: [facility] INVASIVE CV LAB;  Service: Cardiovascular;  Laterality: N/A;  . TEE WITHOUT CARDIOVERSION N/A 02/19/2018   Procedure: TRANSESOPHAGEAL ECHOCARDIOGRAM (TEE);  Surgeon: Alleen BorneBartle, Bryan K, MD;  [facility];  Service: Open Heart Surgery;  Laterality: N/A;    Current  Medications: Current Meds  Medication Sig  . aspirin EC 325 MG EC tablet Take 1 tablet (325 mg total) by mouth daily.  Marland Kitchen. atorvastatin (LIPITOR) 40 MG tablet Take 1 tablet (40 mg total) by mouth daily at 6 PM.  . gabapentin (NEURONTIN) 100 MG capsule Take 1 capsule (100 mg total) by mouth 3 (three) times daily.  . metoprolol tartrate (LOPRESSOR) 25 MG tablet Take 1 tablet (25 mg total) by mouth 2 (two) times daily.  . pantoprazole (PROTONIX) 40 MG tablet Take 1 tablet (40 mg total) by mouth daily. (Patient taking differently: Take 40 mg by mouth daily as needed (acid reflux). )     Allergies:   Patient has no known allergies.   Social History   Socioeconomic History  . Marital status: Single    Spouse name: Not on file  . Number of children: Not on file  . Years of education: Not on file  . Highest education level: Not on file  Occupational History  . Not on file  Social Needs  . Financial resource strain: Not on file  . Food insecurity:    Worry: Not on file    Inability: Not on file  . Transportation needs:    Medical: Not on file    Non-medical: Not on file  Tobacco Use  . Smoking status: Former Smoker    Types: Cigarettes    Last attempt to quit: 09/14/2017    Years since quitting: 0.5  . Smokeless tobacco: Never Used  Substance and Sexual Activity  . Alcohol use: Yes    Comment: occasional on weekends  . Drug use: Never  . Sexual activity: Not on file  Lifestyle  . Physical activity:    Days per week: Not on file    Minutes per session: Not on file  . Stress: Not on file  Relationships  . Social connections:    Talks on phone: Not on file    Gets together: Not on file    Attends religious service: Not on file    Active member of club or organization: Not on file    Attends meetings of clubs or organizations: Not on file    Relationship status: Not on file  Other Topics Concern  . Not on file  Social History Narrative  . Not on file     Family  History: The patient's family history includes Diabetes in his mother; Throat cancer in his father.  ROS:   Please see the history of present illness.    All other systems reviewed and are negative.  EKGs/Labs/Other Studies Reviewed:    The following studies were reviewed today: Discussed my findings with the patient today.  His blood work was stable and he was advised to   Recent Labs: 02/13/2018: ALT 19 02/20/2018: Magnesium 2.6 03/13/2018: BUN 12; Creatinine, Ser 0.78; Hemoglobin 10.0; Platelets 368; Potassium 3.9; Sodium 137; TSH 1.130  Recent Lipid Panel    Component Value Date/Time   CHOL 223 (H) 11/20/2017 1507  TRIG 260 (H) 11/20/2017 1507   HDL 53 11/20/2017 1507   CHOLHDL 4.2 11/20/2017 1507   LDLCALC 118 (H) 11/20/2017 1507    Physical Exam:    VS:  BP 118/72 (BP Location: Right Arm, Patient Position: Sitting, Cuff Size: Normal)   Pulse 83   Ht 5\' 7"  (1.702 m)   Wt 220 lb (99.8 kg)   SpO2 98%   BMI 34.46 kg/m     Wt Readings from Last 3 Encounters:  04/10/18 220 lb (99.8 kg)  03/25/18 215 lb (97.5 kg)  03/13/18 216 lb (98 kg)     GEN: Patient is in no acute distress HEENT: Normal NECK: No JVD; No carotid bruits LYMPHATICS: No lymphadenopathy CARDIAC: Hear sounds regular, 2/6 systolic murmur at the apex. RESPIRATORY:  Clear to auscultation without rales, wheezing or rhonchi  ABDOMEN: Soft, non-tender, non-distended MUSCULOSKELETAL:  No edema; No deformity  SKIN: Warm and dry NEUROLOGIC:  Alert and oriented x 3 PSYCHIATRIC:  Normal affect   Signed, Garwin Brothers, MD  04/10/2018 1:58 PM    [facility]

## 2018-04-17 ENCOUNTER — Ambulatory Visit (INDEPENDENT_AMBULATORY_CARE_PROVIDER_SITE_OTHER): Payer: Self-pay | Admitting: Surgery

## 2018-04-17 VITALS — BP 127/83 | HR 100 | Resp 20 | Ht 67.0 in | Wt 219.0 lb

## 2018-04-17 DIAGNOSIS — I2510 Atherosclerotic heart disease of native coronary artery without angina pectoris: Secondary | ICD-10-CM

## 2018-04-17 DIAGNOSIS — I350 Nonrheumatic aortic (valve) stenosis: Secondary | ICD-10-CM

## 2018-04-17 DIAGNOSIS — Z952 Presence of prosthetic heart valve: Secondary | ICD-10-CM

## 2018-04-17 DIAGNOSIS — I712 Thoracic aortic aneurysm, without rupture, unspecified: Secondary | ICD-10-CM

## 2018-04-17 DIAGNOSIS — Z951 Presence of aortocoronary bypass graft: Secondary | ICD-10-CM

## 2018-04-18 ENCOUNTER — Encounter: Payer: Self-pay | Admitting: Surgery

## 2018-04-18 ENCOUNTER — Other Ambulatory Visit: Payer: Self-pay | Admitting: *Deleted

## 2018-04-18 DIAGNOSIS — R29898 Other symptoms and signs involving the musculoskeletal system: Secondary | ICD-10-CM

## 2018-04-18 DIAGNOSIS — I82621 Acute embolism and thrombosis of deep veins of right upper extremity: Secondary | ICD-10-CM

## 2018-04-18 DIAGNOSIS — Z951 Presence of aortocoronary bypass graft: Secondary | ICD-10-CM

## 2018-04-18 DIAGNOSIS — Z952 Presence of prosthetic heart valve: Secondary | ICD-10-CM

## 2018-04-18 DIAGNOSIS — Z8679 Personal history of other diseases of the circulatory system: Secondary | ICD-10-CM

## 2018-04-18 DIAGNOSIS — M7989 Other specified soft tissue disorders: Secondary | ICD-10-CM

## 2018-04-18 DIAGNOSIS — Z9889 Other specified postprocedural states: Secondary | ICD-10-CM

## 2018-04-18 NOTE — Progress Notes (Signed)
HPI:  The patient returns for follow-up status post placement of an ascending aortic aneurysm under deep hypothermic circulatory arrest using right axillary artery cannulation, aortic valve replacement with a 25 mm INSPIRIS RESILIA aortic valve, Wheat procedure (supra-coronary aortic anastomosis) and coronary bypass graft surgery x3 02/19/2018.  He had an uneventful postoperative course and his only complaint was that he had some numbness in his right hand postoperatively.  He returned the office to see our PA on 03/25/2018 and continued to have pain and numbness in his right hand and also noted some swelling and weakness.  Since that time he has not noted much improvement in his symptoms.  He continues to note swelling in the right upper extremity compared to the left.  He cannot make a grip on the right side.  He notes numbness in an ulnar nerve distribution in the right hand.  He also has some pain in his anterior right upper arm.  He said that he also feels like his right hand is sometimes hot and sweaty and sometimes cold.  He has been feeling otherwise well without chest pain or shortness of breath.  He has been walking daily.  Current Outpatient Medications  Medication Sig Dispense Refill  . aspirin EC 325 MG EC tablet Take 1 tablet (325 mg total) by mouth daily. 30 tablet 0  . atorvastatin (LIPITOR) 40 MG tablet Take 1 tablet (40 mg total) by mouth daily at 6 PM. 30 tablet 1  . gabapentin (NEURONTIN) 100 MG capsule Take 1 capsule (100 mg total) by mouth 3 (three) times daily. 90 capsule 1  . metoprolol tartrate (LOPRESSOR) 25 MG tablet Take 1 tablet (25 mg total) by mouth 2 (two) times daily. 60 tablet 1  . pantoprazole (PROTONIX) 40 MG tablet Take 1 tablet (40 mg total) by mouth daily. (Patient taking differently: Take 40 mg by mouth daily as needed (acid reflux). ) 90 tablet 3   No current facility-administered medications for this visit.      Physical Exam: BP 127/83   Pulse 100    Resp 20   Ht 5\' 7"  (1.702 m)   Wt 219 lb (99.3 kg)   SpO2 99% Comment: RA  BMI 34.30 kg/m  He looks well. Cardiac exam shows regular rate and rhythm with normal heart sounds. Lungs are clear. The chest incision is well-healed and sternum is stable.  The right axillary incision is well-healed.  There is no palpable abnormality.  The right upper extremity is edematous from the shoulder down and seems worse in the hand.  He has a strong right radial and ulnar pulse and hand is pink and warm.  He does have a weak grip and inability to oppose his thumb.   Impression:  Overall Mr. Malvin Johns is making a good recovery from his surgery except that he appears to have some neurologic dysfunction in the right upper extremity possibly related to brachial plexus stretch or injury from sternal retraction and exposure and cannulation of the right axillary artery.  The approach to his axillary artery was unremarkable and the brachial plexus was identified and carefully avoided.  He also appears to have some autonomic dysfunction with intermittent feelings of hot and sweaty alternating with coldness in his right hand.  His right upper extremity is swollen and we will get a duplex ultrasound of the right upper extremity to rule out DVT as a cause of the swelling.  If this is negative for DVT that I think we  have to treat him expectantly with physical therapy and hope that his symptoms gradually improve with time.  It is possible that autonomic dysfunction could be contributing to the edema in the right upper extremity as well as the feelings of hot and cold in his hand.  I discussed all this with the patient and his daughter and they are in agreement with proceeding with a duplex ultrasound of the right upper extremity and physical therapy if this is negative.  Plan:  The patient will be scheduled for a duplex ultrasound of the right upper extremity to rule out DVT.  If this is negative then we will set him up with  physical therapy.  I will plan to see him back in about 1 month for follow-up.  I told him that I would follow-up on the ultrasound as soon as it is done and give him a call and let them know the results are and any changes in therapy based on those results.   Alleen Borne, MD [facility] [phone]

## 2018-04-19 ENCOUNTER — Ambulatory Visit (HOSPITAL_COMMUNITY)
Admission: RE | Admit: 2018-04-19 | Discharge: 2018-04-19 | Disposition: A | Discharge status: Home / Self Care | Payer: BLUE CROSS/BLUE SHIELD | Source: Ambulatory Visit | Attending: Vascular Surgery | Admitting: Vascular Surgery

## 2018-04-19 DIAGNOSIS — M7989 Other specified soft tissue disorders: Secondary | ICD-10-CM | POA: Diagnosis not present

## 2018-04-19 DIAGNOSIS — I82621 Acute embolism and thrombosis of deep veins of right upper extremity: Secondary | ICD-10-CM

## 2018-04-19 DIAGNOSIS — R531 Weakness: Secondary | ICD-10-CM | POA: Insufficient documentation

## 2018-04-19 DIAGNOSIS — Z952 Presence of prosthetic heart valve: Secondary | ICD-10-CM | POA: Diagnosis not present

## 2018-04-19 DIAGNOSIS — R29898 Other symptoms and signs involving the musculoskeletal system: Secondary | ICD-10-CM | POA: Diagnosis not present

## 2018-04-19 DIAGNOSIS — Z951 Presence of aortocoronary bypass graft: Secondary | ICD-10-CM | POA: Insufficient documentation

## 2018-04-19 DIAGNOSIS — Z9889 Other specified postprocedural states: Secondary | ICD-10-CM

## 2018-04-19 DIAGNOSIS — Z8679 Personal history of other diseases of the circulatory system: Secondary | ICD-10-CM

## 2018-04-22 ENCOUNTER — Telehealth: Payer: Self-pay

## 2018-04-22 NOTE — Telephone Encounter (Signed)
Referral for Physical Therapy @ [facility] was faxed for Rehab on right arm and hand.per Dr Laneta Simmers

## 2018-04-30 ENCOUNTER — Telehealth: Payer: Self-pay

## 2018-04-30 NOTE — Telephone Encounter (Signed)
Patient's daughter- in- law, [place]any contacted the office concerned about the patient's dizziness.  She stated that it is known that he is anemic and has struggled with taking iron supplements because of it causing constipation.   She also stated that the dizziness also occurs when he is getting up in/ out of the chair or bed.  I also advised her that his medication, metoprolol, can sometimes cause his blood pressure to drop when he makes quick movements which could be the culprit for his dizziness.  I advised her to tell him to make slower movements when changing positions to see if maybe this was the cause for his dizziness.  I told her there may be a combination of both problems causing his dizziness.  I did advise that he could take a stool softener with his iron supplement if needed for the constipation.  I also advised her to contact his Cardiologist office for further advise.  She acknowledged receipt.

## 2018-05-17 DIAGNOSIS — M25511 Pain in right shoulder: Secondary | ICD-10-CM | POA: Diagnosis not present

## 2018-05-17 DIAGNOSIS — M79601 Pain in right arm: Secondary | ICD-10-CM | POA: Diagnosis not present

## 2018-05-17 DIAGNOSIS — R200 Anesthesia of skin: Secondary | ICD-10-CM | POA: Diagnosis not present

## 2018-05-20 DIAGNOSIS — R200 Anesthesia of skin: Secondary | ICD-10-CM | POA: Diagnosis not present

## 2018-05-20 DIAGNOSIS — M79601 Pain in right arm: Secondary | ICD-10-CM | POA: Diagnosis not present

## 2018-05-20 DIAGNOSIS — M25511 Pain in right shoulder: Secondary | ICD-10-CM | POA: Diagnosis not present

## 2018-05-22 DIAGNOSIS — M25511 Pain in right shoulder: Secondary | ICD-10-CM | POA: Diagnosis not present

## 2018-05-22 DIAGNOSIS — R200 Anesthesia of skin: Secondary | ICD-10-CM | POA: Diagnosis not present

## 2018-05-22 DIAGNOSIS — M79601 Pain in right arm: Secondary | ICD-10-CM | POA: Diagnosis not present

## 2018-05-27 DIAGNOSIS — M25511 Pain in right shoulder: Secondary | ICD-10-CM | POA: Diagnosis not present

## 2018-05-27 DIAGNOSIS — M79601 Pain in right arm: Secondary | ICD-10-CM | POA: Diagnosis not present

## 2018-05-27 DIAGNOSIS — R200 Anesthesia of skin: Secondary | ICD-10-CM | POA: Diagnosis not present

## 2018-05-29 DIAGNOSIS — R200 Anesthesia of skin: Secondary | ICD-10-CM | POA: Diagnosis not present

## 2018-05-29 DIAGNOSIS — M79601 Pain in right arm: Secondary | ICD-10-CM | POA: Diagnosis not present

## 2018-05-29 DIAGNOSIS — M25511 Pain in right shoulder: Secondary | ICD-10-CM | POA: Diagnosis not present

## 2018-06-03 DIAGNOSIS — M25511 Pain in right shoulder: Secondary | ICD-10-CM | POA: Diagnosis not present

## 2018-06-03 DIAGNOSIS — M79601 Pain in right arm: Secondary | ICD-10-CM | POA: Diagnosis not present

## 2018-06-03 DIAGNOSIS — R200 Anesthesia of skin: Secondary | ICD-10-CM | POA: Diagnosis not present

## 2018-06-07 DIAGNOSIS — M79601 Pain in right arm: Secondary | ICD-10-CM | POA: Diagnosis not present

## 2018-06-07 DIAGNOSIS — R200 Anesthesia of skin: Secondary | ICD-10-CM | POA: Diagnosis not present

## 2018-06-07 DIAGNOSIS — M25511 Pain in right shoulder: Secondary | ICD-10-CM | POA: Diagnosis not present

## 2018-06-10 DIAGNOSIS — R200 Anesthesia of skin: Secondary | ICD-10-CM | POA: Diagnosis not present

## 2018-06-10 DIAGNOSIS — M79601 Pain in right arm: Secondary | ICD-10-CM | POA: Diagnosis not present

## 2018-06-10 DIAGNOSIS — M25511 Pain in right shoulder: Secondary | ICD-10-CM | POA: Diagnosis not present

## 2018-06-12 DIAGNOSIS — M25511 Pain in right shoulder: Secondary | ICD-10-CM | POA: Diagnosis not present

## 2018-06-12 DIAGNOSIS — R200 Anesthesia of skin: Secondary | ICD-10-CM | POA: Diagnosis not present

## 2018-06-12 DIAGNOSIS — M79601 Pain in right arm: Secondary | ICD-10-CM | POA: Diagnosis not present

## 2018-06-17 DIAGNOSIS — M79601 Pain in right arm: Secondary | ICD-10-CM | POA: Diagnosis not present

## 2018-06-17 DIAGNOSIS — M25511 Pain in right shoulder: Secondary | ICD-10-CM | POA: Diagnosis not present

## 2018-06-17 DIAGNOSIS — R200 Anesthesia of skin: Secondary | ICD-10-CM | POA: Diagnosis not present

## 2018-06-19 DIAGNOSIS — M25511 Pain in right shoulder: Secondary | ICD-10-CM | POA: Diagnosis not present

## 2018-06-19 DIAGNOSIS — M79601 Pain in right arm: Secondary | ICD-10-CM | POA: Diagnosis not present

## 2018-06-19 DIAGNOSIS — R200 Anesthesia of skin: Secondary | ICD-10-CM | POA: Diagnosis not present

## 2018-06-24 DIAGNOSIS — M79601 Pain in right arm: Secondary | ICD-10-CM | POA: Diagnosis not present

## 2018-06-24 DIAGNOSIS — M25511 Pain in right shoulder: Secondary | ICD-10-CM | POA: Diagnosis not present

## 2018-06-24 DIAGNOSIS — R200 Anesthesia of skin: Secondary | ICD-10-CM | POA: Diagnosis not present

## 2018-06-26 DIAGNOSIS — M79601 Pain in right arm: Secondary | ICD-10-CM | POA: Diagnosis not present

## 2018-06-26 DIAGNOSIS — M25511 Pain in right shoulder: Secondary | ICD-10-CM | POA: Diagnosis not present

## 2018-06-26 DIAGNOSIS — R200 Anesthesia of skin: Secondary | ICD-10-CM | POA: Diagnosis not present

## 2018-07-01 DIAGNOSIS — R200 Anesthesia of skin: Secondary | ICD-10-CM | POA: Diagnosis not present

## 2018-07-01 DIAGNOSIS — M25511 Pain in right shoulder: Secondary | ICD-10-CM | POA: Diagnosis not present

## 2018-07-01 DIAGNOSIS — M79601 Pain in right arm: Secondary | ICD-10-CM | POA: Diagnosis not present

## 2018-07-03 DIAGNOSIS — M79601 Pain in right arm: Secondary | ICD-10-CM | POA: Diagnosis not present

## 2018-07-03 DIAGNOSIS — R200 Anesthesia of skin: Secondary | ICD-10-CM | POA: Diagnosis not present

## 2018-07-03 DIAGNOSIS — M25511 Pain in right shoulder: Secondary | ICD-10-CM | POA: Diagnosis not present

## 2018-07-05 ENCOUNTER — Ambulatory Visit (INDEPENDENT_AMBULATORY_CARE_PROVIDER_SITE_OTHER): Payer: BLUE CROSS/BLUE SHIELD | Admitting: Surgery

## 2018-07-05 VITALS — BP 134/90 | HR 90 | Resp 20 | Ht 67.0 in | Wt 227.0 lb

## 2018-07-05 DIAGNOSIS — Z952 Presence of prosthetic heart valve: Secondary | ICD-10-CM

## 2018-07-05 DIAGNOSIS — Z951 Presence of aortocoronary bypass graft: Secondary | ICD-10-CM | POA: Diagnosis not present

## 2018-07-05 DIAGNOSIS — Z9889 Other specified postprocedural states: Secondary | ICD-10-CM | POA: Diagnosis not present

## 2018-07-05 DIAGNOSIS — Z8679 Personal history of other diseases of the circulatory system: Secondary | ICD-10-CM | POA: Diagnosis not present

## 2018-07-06 ENCOUNTER — Encounter: Payer: Self-pay | Admitting: Surgery

## 2018-07-06 NOTE — Progress Notes (Signed)
     HPI:  The patient returns for follow-up status post placement of an ascending aortic aneurysm under deep hypothermic circulatory arrest using right axillary artery cannulation, aortic valve replacement with a 25 mm INSPIRIS RESILIA aortic valve, Wheat procedure (supra-coronary aortic anastomosis) and coronary bypass graft surgery x3 02/19/2018.  He had an uneventful postoperative course and his only complaint was that he had some numbness in his right hand postoperatively. He returned the office to see our PA on 03/25/2018 and continued to have pain and numbness in his right hand and also noted some swelling and weakness. I last saw him on 04/17/2018 and he still had signs of right brachial plexus injury probably related to sternal retraction and exposure and cannulation of the right axillary artery. He has continued physical therapy and noted marked improvement in the strength and function of the RUE. 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 He feels great overall.   Current Outpatient Medications  Medication Sig Dispense Refill  . aspirin EC 325 MG EC tablet Take 1 tablet (325 mg total) by mouth daily. 30 tablet 0  . atorvastatin (LIPITOR) 40 MG tablet Take 1 tablet (40 mg total) by mouth daily at 6 PM. 30 tablet 1  . gabapentin (NEURONTIN) 100 MG capsule Take 1 capsule (100 mg total) by mouth 3 (three) times daily. 90 capsule 1  . metoprolol tartrate (LOPRESSOR) 25 MG tablet Take 1 tablet (25 mg total) by mouth 2 (two) times daily. 60 tablet 1  . pantoprazole (PROTONIX) 40 MG tablet Take 1 tablet (40 mg total) by mouth daily. (Patient taking differently: Take 40 mg by mouth daily as needed (acid reflux). ) 90 tablet 3   No current facility-administered medications for this visit.      Physical Exam: BP 134/90   Pulse 90   Resp 20   Ht 5\' 7"  (1.702 m)   Wt 227 lb (103 kg)   SpO2 98% Comment: RA  BMI 35.55 kg/m  He looks well Cardiac exam  shows a regular rate and rhythm with normal heart sounds. There is no murmur Lungs are clear The chest incisions are well-healed The RUE swelling has resolved. There is still some mild weakness but much better than before.   Diagnostic Tests:  None today  Impression:  Overall I think he has made a good recovery following his surgery. He still has some RUE weakness due to brachial plexus injury but that has significantly improved since his last visit and may continue to improve over time. I think he can return to work with no restrictions. I will repeat his CTA chest in one hear to reexamine the remainder of his aorta.  Plan:  Return to see me in one year with a CTA of the chest.   I spent 10 minutes performing this established patient evaluation and > 50% of this time was spent face to face counseling and coordinating the surveillance of this patient's aortic aneurysm.    Alleen BorneBryan K Tekeya Geffert, MD [facility] [phone]

## 2018-07-09 DIAGNOSIS — M25511 Pain in right shoulder: Secondary | ICD-10-CM | POA: Diagnosis not present

## 2018-07-09 DIAGNOSIS — M79601 Pain in right arm: Secondary | ICD-10-CM | POA: Diagnosis not present

## 2018-07-09 DIAGNOSIS — R200 Anesthesia of skin: Secondary | ICD-10-CM | POA: Diagnosis not present

## 2018-10-22 ENCOUNTER — Ambulatory Visit: Payer: BLUE CROSS/BLUE SHIELD | Admitting: Cardiology

## 2018-10-28 ENCOUNTER — Other Ambulatory Visit: Payer: Self-pay

## 2018-10-28 ENCOUNTER — Encounter: Payer: Self-pay | Admitting: Cardiology

## 2018-10-28 ENCOUNTER — Ambulatory Visit (INDEPENDENT_AMBULATORY_CARE_PROVIDER_SITE_OTHER): Payer: BLUE CROSS/BLUE SHIELD | Admitting: Cardiology

## 2018-10-28 VITALS — BP 122/76 | HR 87 | Ht 67.0 in | Wt 219.0 lb

## 2018-10-28 DIAGNOSIS — I82621 Acute embolism and thrombosis of deep veins of right upper extremity: Secondary | ICD-10-CM

## 2018-10-28 DIAGNOSIS — E782 Mixed hyperlipidemia: Secondary | ICD-10-CM | POA: Diagnosis not present

## 2018-10-28 DIAGNOSIS — Z87891 Personal history of nicotine dependence: Secondary | ICD-10-CM

## 2018-10-28 DIAGNOSIS — Z1329 Encounter for screening for other suspected endocrine disorder: Secondary | ICD-10-CM

## 2018-10-28 DIAGNOSIS — Z8679 Personal history of other diseases of the circulatory system: Secondary | ICD-10-CM

## 2018-10-28 DIAGNOSIS — Z9889 Other specified postprocedural states: Secondary | ICD-10-CM

## 2018-10-28 DIAGNOSIS — Z952 Presence of prosthetic heart valve: Secondary | ICD-10-CM | POA: Diagnosis not present

## 2018-10-28 DIAGNOSIS — I2510 Atherosclerotic heart disease of native coronary artery without angina pectoris: Secondary | ICD-10-CM

## 2018-10-28 HISTORY — DX: Acute embolism and thrombosis of deep veins of right upper extremity: I82.621

## 2018-10-28 MED ORDER — METOPROLOL TARTRATE 25 MG PO TABS: 25.0000 mg | ORAL_TABLET | Freq: Two times a day (BID) | ORAL | 6 refills | Status: DC

## 2018-10-28 MED ORDER — ATORVASTATIN CALCIUM 40 MG PO TABS: 40.0000 mg | ORAL_TABLET | Freq: Every day | ORAL | 6 refills | Status: DC

## 2018-10-28 MED ORDER — GABAPENTIN 100 MG PO CAPS: 100.0000 mg | ORAL_CAPSULE | Freq: Three times a day (TID) | ORAL | 6 refills | Status: DC

## 2018-10-28 NOTE — Patient Instructions (Signed)
Medication Instructions:  Your physician recommends that you continue on your current medications as directed. Please refer to the Current Medication list given to you today.  If you need a refill on your cardiac medications before your next appointment, please call your pharmacy.   Lab work: Your physician recommends that you have a lipid panel, BMP, CBC,TSH and LFT drawn on Friday, March 20,2020. You will need to fast. You can walk in anytime between 8-4 except 12-1.   If you have labs (blood work) drawn today and your tests are completely normal, you will receive your results only by: Marland Kitchen MyChart Message (if you have MyChart) OR . A paper copy in the mail If you have any lab test that is abnormal or we need to change your treatment, we will call you to review the results.  Testing/Procedures: NONE  Follow-Up: At [facility], you and your health needs are our priority.  As part of our continuing mission to provide you with exceptional heart care, we have created designated Provider Care Teams.  These Care Teams include your primary Cardiologist (physician) and Advanced Practice Providers (APPs -  Physician Assistants and Nurse Practitioners) who all work together to provide you with the care you need, when you need it. You will need a follow up appointment in 6 months.

## 2018-10-28 NOTE — Progress Notes (Signed)
Cardiology Office Note:    Date:  10/28/2018   ID:  Tyler Kemp, DOB Oct 25, 1960, MRN [id number]  PCP:  Practice, [facility]  Cardiologist:  Garwin Brothers, MD   Referring MD: Practice, Du[facility] Limerick*    ASSESSMENT:    1. Coronary artery disease involving native coronary artery of native heart without angina pectoris   2. Mixed dyslipidemia   3. S/P aortic aneurysm repair   4. S/P AVR   5. Ex-smoker    PLAN:    In order of problems listed above:  1. Secondary prevention stressed with the patient.  Importance of compliance with diet and medication stressed and he vocalized understanding.  His blood pressure is stable.  Diet was discussed for dyslipidemia.  He will be back on Friday for fasting blood work including lipids and we hope to restart his statin therapy.  He continues to take 81 mg of aspirin on a daily basis. 2. Patient had multiple questions which were answered to his satisfaction.Patient will be seen in follow-up appointment in 6 months or earlier if the patient has any concerns    Medication Adjustments/Labs and Tests Ordered: Current medicines are reviewed at length with the patient today.  Concerns regarding medicines are outlined above.  No orders of the defined types were placed in this encounter.  No orders of the defined types were placed in this encounter.     No chief complaint on file.    History of Present Illness:    Tyler Kemp is a 58 y.o. male.  Patient has history of aortic valve replacement and aortic aneurysm repair.  He denies any problems at this time and takes care of activities of daily living.  No chest pain orthopnea or PND.  He leads a fairly sedentary lifestyle and is back to work.  Unfortunately he is not taking his statin therapy because he is ran out of his medications and did not call us.  At the time of my evaluation, the patient is alert awake oriented and in no distress.  Past Medical History:  Diagnosis Date   . Coronary artery disease   . Food impaction of esophagus 12/11/2017  . GERD (gastroesophageal reflux disease)   . Hiatal hernia   . Pneumonia   . PONV (postoperative nausea and vomiting)   . Severe aortic stenosis   . Thoracic aortic aneurysm [facility])     Past Surgical History:  Procedure Laterality Date  . BENTALL PROCEDURE N/A 02/19/2018   Procedure: BENTALL PROCEDURE;  Surgeon: Alleen Borne, MD;  Location[facility] OR;  Service: Open Heart Surgery;  Laterality: N/A;  RIGHT AXILLARY CANNULATION  CIRC ARREST  BILATERAL RADIAL ARTERIAL LINES  . CHOLECYSTECTOMY    . CORONARY ARTERY BYPASS GRAFT N/A 02/19/2018   Procedure: CORONARY ARTERY BYPASS GRAFTING (CABG)x3. LIMA TO LAD. SVG TO PL. SVG to OM. SAPHENOUS VEIN HARVEST.;  Surgeon: Alleen Borne, MD;  Location: [facility] OR;  Service: Open Heart Surgery;  Laterality: N/A;  . ESOPHAGOGASTRODUODENOSCOPY (EGD) WITH PROPOFOL N/A 12/11/2017   Procedure: ESOPHAGOGASTRODUODENOSCOPY (EGD) WITH PROPOFOL;  Surgeon: Iva Boop, MD;  Location: [facility] ENDOSCOPY;  Service: Endoscopy;  Laterality: N/A;  . FOREIGN BODY REMOVAL N/A 12/11/2017   Procedure: FOREIGN BODY REMOVAL;  Surgeon: Iva Boop, MD;  Location: [facility] ENDOSCOPY;  Service: Endoscopy;  Laterality: N/A;  . MULTIPLE TOOTH EXTRACTIONS    . REPLACEMENT ASCENDING AORTA N/A 02/19/2018   Procedure: REPLACEMENT ASCENDING AORTA;  Surgeon: Alleen Borne, MD;  Location: [facility]  OR;  Service: Open Heart Surgery;  Laterality: N/A;  . RIGHT/LEFT HEART CATH AND CORONARY ANGIOGRAPHY N/A 12/04/2017   Procedure: RIGHT/LEFT HEART CATH AND CORONARY ANGIOGRAPHY;  Surgeon: Lyn Records, MD;  Location: [facility] INVASIVE CV LAB;  Service: Cardiovascular;  Laterality: N/A;  . TEE WITHOUT CARDIOVERSION N/A 02/19/2018   Procedure: TRANSESOPHAGEAL ECHOCARDIOGRAM (TEE);  Surgeon: Alleen Borne, MD;  Location: [facility] OR;  Service: Open Heart Surgery;  Laterality: N/A;    Current Medications: Current Meds  Medication Sig  . aspirin EC 325  MG EC tablet Take 1 tablet (325 mg total) by mouth daily.  Marland Kitchen atorvastatin (LIPITOR) 40 MG tablet Take 1 tablet (40 mg total) by mouth daily at 6 PM.  . gabapentin (NEURONTIN) 100 MG capsule Take 1 capsule (100 mg total) by mouth 3 (three) times daily.  . metoprolol tartrate (LOPRESSOR) 25 MG tablet Take 1 tablet (25 mg total) by mouth 2 (two) times daily.  . pantoprazole (PROTONIX) 40 MG tablet Take 1 tablet (40 mg total) by mouth daily. (Patient taking differently: Take 40 mg by mouth daily as needed (acid reflux). )     Allergies:   Patient has no known allergies.   Social History   Socioeconomic History  . Marital status: Single    Spouse name: Not on file  . Number of children: Not on file  . Years of education: Not on file  . Highest education level: Not on file  Occupational History  . Not on file  Social Needs  . Financial resource strain: Not on file  . Food insecurity:    Worry: Not on file    Inability: Not on file  . Transportation needs:    Medical: Not on file    Non-medical: Not on file  Tobacco Use  . Smoking status: Former Smoker    Types: Cigarettes    Last attempt to quit: 09/14/2017    Years since quitting: 1.1  . Smokeless tobacco: Never Used  Substance and Sexual Activity  . Alcohol use: Yes    Comment: occasional on weekends  . Drug use: Never  . Sexual activity: Not on file  Lifestyle  . Physical activity:    Days per week: Not on file    Minutes per session: Not on file  . Stress: Not on file  Relationships  . Social connections:    Talks on phone: Not on file    Gets together: Not on file    Attends religious service: Not on file    Active member of club or organization: Not on file    Attends meetings of clubs or organizations: Not on file    Relationship status: Not on file  Other Topics Concern  . Not on file  Social History Narrative  . Not on file     Family History: The patient's family history includes Diabetes in his mother;  Throat cancer in his father.  ROS:   Please see the history of present illness.    All other systems reviewed and are negative.  EKGs/Labs/Other Studies Reviewed:    The following studies were reviewed today: I discussed my findings with the patient at extensive length.   Recent Labs: 02/13/2018: ALT 19 02/20/2018: Magnesium 2.6 03/13/2018: BUN 12; Creatinine, Ser 0.78; Hemoglobin 10.0; Platelets 368; Potassium 3.9; Sodium 137; TSH 1.130  Recent Lipid Panel    Component Value Date/Time   CHOL 223 (H) 11/20/2017 1507   TRIG 260 (H) 11/20/2017 1507   HDL 53  11/20/2017 1507   CHOLHDL 4.2 11/20/2017 1507   LDLCALC 118 (H) 11/20/2017 1507    Physical Exam:    VS:  BP 122/76 (BP Location: Right Arm, Patient Position: Sitting, Cuff Size: Normal)   Pulse 87   Ht 5\' 7"  (1.702 m)   Wt 219 lb (99.3 kg)   SpO2 98%   BMI 34.30 kg/m     Wt Readings from Last 3 Encounters:  10/28/18 219 lb (99.3 kg)  07/05/18 227 lb (103 kg)  04/17/18 219 lb (99.3 kg)     GEN: Patient is in no acute distress HEENT: Normal NECK: No JVD; No carotid bruits LYMPHATICS: No lymphadenopathy CARDIAC: Hear sounds regular, 2/6 systolic murmur at the apex. RESPIRATORY:  Clear to auscultation without rales, wheezing or rhonchi  ABDOMEN: Soft, non-tender, non-distended MUSCULOSKELETAL:  No edema; No deformity  SKIN: Warm and dry NEUROLOGIC:  Alert and oriented x 3 PSYCHIATRIC:  Normal affect   Signed, Garwin Brothersajan R Revankar, MD  10/28/2018 4:11 PM    [facility]

## 2018-11-01 ENCOUNTER — Other Ambulatory Visit: Payer: Self-pay | Admitting: Cardiology

## 2018-11-01 DIAGNOSIS — Z1329 Encounter for screening for other suspected endocrine disorder: Secondary | ICD-10-CM | POA: Diagnosis not present

## 2018-11-01 DIAGNOSIS — I2510 Atherosclerotic heart disease of native coronary artery without angina pectoris: Secondary | ICD-10-CM | POA: Diagnosis not present

## 2018-11-01 DIAGNOSIS — E782 Mixed hyperlipidemia: Secondary | ICD-10-CM | POA: Diagnosis not present

## 2018-11-02 LAB — CBC
HEMATOCRIT: 41.6 % (ref 37.5–51.0)
Hemoglobin: 13.5 g/dL (ref 13.0–17.7)
MCH: 28.2 pg (ref 26.6–33.0)
MCHC: 32.5 g/dL (ref 31.5–35.7)
MCV: 87 fL (ref 79–97)
Platelets: 187 10*3/uL (ref 150–450)
RBC: 4.78 x10E6/uL (ref 4.14–5.80)
RDW: 13.2 % (ref 11.6–15.4)
WBC: 6.9 10*3/uL (ref 3.4–10.8)

## 2018-11-02 LAB — BASIC METABOLIC PANEL
BUN/Creatinine Ratio: 16 (ref 9–20)
BUN: 13 mg/dL (ref 6–24)
CHLORIDE: 104 mmol/L (ref 96–106)
CO2: 23 mmol/L (ref 20–29)
Calcium: 8.9 mg/dL (ref 8.7–10.2)
Creatinine, Ser: 0.82 mg/dL (ref 0.76–1.27)
GFR calc non Af Amer: 97 mL/min/{1.73_m2} (ref >59)
GFR, EST AFRICAN AMERICAN: 112 mL/min/{1.73_m2} (ref >59)
Glucose: 85 mg/dL (ref 65–99)
Potassium: 4.5 mmol/L (ref 3.5–5.2)
Sodium: 135 mmol/L (ref 134–144)

## 2018-11-02 LAB — HEPATIC FUNCTION PANEL
ALK PHOS: 71 IU/L (ref 39–117)
ALT: 22 IU/L (ref 0–44)
AST: 24 IU/L (ref 0–40)
Albumin: 4.3 g/dL (ref 3.8–4.9)
Bilirubin Total: 0.4 mg/dL (ref 0.0–1.2)
Bilirubin, Direct: 0.12 mg/dL (ref 0.00–0.40)
TOTAL PROTEIN: 6.4 g/dL (ref 6.0–8.5)

## 2018-11-02 LAB — LIPID PANEL
CHOLESTEROL TOTAL: 159 mg/dL (ref 100–199)
Chol/HDL Ratio: 2.8 ratio (ref 0.0–5.0)
HDL: 56 mg/dL (ref >39)
LDL CALC: 87 mg/dL (ref 0–99)
TRIGLYCERIDES: 79 mg/dL (ref 0–149)
VLDL Cholesterol Cal: 16 mg/dL (ref 5–40)

## 2018-11-02 LAB — TSH: TSH: 1.33 u[IU]/mL (ref 0.450–4.500)

## 2018-11-04 ENCOUNTER — Telehealth: Payer: Self-pay

## 2018-11-04 NOTE — Telephone Encounter (Signed)
Patient called and notified of lab results. 

## 2018-11-04 NOTE — Telephone Encounter (Signed)
-----   Message from Garwin Brothers, MD sent at 11/04/2018 11:18 AM EDT ----- The results of the study is unremarkable. Please inform patient. I will discuss in detail at next appointment. Cc  primary care/referring physician Garwin Brothers, MD 11/04/2018 11:18 AM

## 2018-12-24 DIAGNOSIS — R21 Rash and other nonspecific skin eruption: Secondary | ICD-10-CM | POA: Diagnosis not present

## 2018-12-24 DIAGNOSIS — L255 Unspecified contact dermatitis due to plants, except food: Secondary | ICD-10-CM | POA: Diagnosis not present

## 2018-12-30 DIAGNOSIS — I719 Aortic aneurysm of unspecified site, without rupture: Secondary | ICD-10-CM | POA: Diagnosis not present

## 2018-12-30 DIAGNOSIS — R1011 Right upper quadrant pain: Secondary | ICD-10-CM | POA: Diagnosis not present

## 2018-12-30 DIAGNOSIS — Z7902 Long term (current) use of antithrombotics/antiplatelets: Secondary | ICD-10-CM | POA: Diagnosis not present

## 2018-12-30 DIAGNOSIS — Z87891 Personal history of nicotine dependence: Secondary | ICD-10-CM | POA: Diagnosis not present

## 2018-12-30 DIAGNOSIS — K219 Gastro-esophageal reflux disease without esophagitis: Secondary | ICD-10-CM | POA: Diagnosis not present

## 2018-12-30 DIAGNOSIS — K449 Diaphragmatic hernia without obstruction or gangrene: Secondary | ICD-10-CM | POA: Diagnosis not present

## 2018-12-30 DIAGNOSIS — I2510 Atherosclerotic heart disease of native coronary artery without angina pectoris: Secondary | ICD-10-CM | POA: Diagnosis not present

## 2018-12-30 DIAGNOSIS — R739 Hyperglycemia, unspecified: Secondary | ICD-10-CM | POA: Diagnosis not present

## 2018-12-30 DIAGNOSIS — I10 Essential (primary) hypertension: Secondary | ICD-10-CM | POA: Diagnosis not present

## 2018-12-30 DIAGNOSIS — R7989 Other specified abnormal findings of blood chemistry: Secondary | ICD-10-CM | POA: Diagnosis not present

## 2018-12-30 DIAGNOSIS — Z79899 Other long term (current) drug therapy: Secondary | ICD-10-CM | POA: Diagnosis not present

## 2018-12-30 DIAGNOSIS — K838 Other specified diseases of biliary tract: Secondary | ICD-10-CM | POA: Diagnosis not present

## 2018-12-30 DIAGNOSIS — K8050 Calculus of bile duct without cholangitis or cholecystitis without obstruction: Secondary | ICD-10-CM

## 2018-12-30 DIAGNOSIS — E871 Hypo-osmolality and hyponatremia: Secondary | ICD-10-CM

## 2018-12-30 DIAGNOSIS — K859 Acute pancreatitis without necrosis or infection, unspecified: Secondary | ICD-10-CM | POA: Diagnosis not present

## 2018-12-30 DIAGNOSIS — Z7982 Long term (current) use of aspirin: Secondary | ICD-10-CM | POA: Diagnosis not present

## 2018-12-30 DIAGNOSIS — R945 Abnormal results of liver function studies: Secondary | ICD-10-CM | POA: Diagnosis not present

## 2018-12-30 DIAGNOSIS — I700 Atherosclerosis of aorta: Secondary | ICD-10-CM | POA: Diagnosis not present

## 2018-12-30 DIAGNOSIS — R112 Nausea with vomiting, unspecified: Secondary | ICD-10-CM | POA: Diagnosis not present

## 2018-12-30 HISTORY — DX: Calculus of bile duct without cholangitis or cholecystitis without obstruction: K80.50

## 2018-12-30 HISTORY — DX: Hypo-osmolality and hyponatremia: E87.1

## 2018-12-31 DIAGNOSIS — E871 Hypo-osmolality and hyponatremia: Secondary | ICD-10-CM | POA: Diagnosis not present

## 2018-12-31 DIAGNOSIS — K8050 Calculus of bile duct without cholangitis or cholecystitis without obstruction: Secondary | ICD-10-CM | POA: Diagnosis not present

## 2018-12-31 DIAGNOSIS — E668 Other obesity: Secondary | ICD-10-CM | POA: Diagnosis not present

## 2018-12-31 DIAGNOSIS — K219 Gastro-esophageal reflux disease without esophagitis: Secondary | ICD-10-CM | POA: Diagnosis not present

## 2018-12-31 DIAGNOSIS — I2510 Atherosclerotic heart disease of native coronary artery without angina pectoris: Secondary | ICD-10-CM | POA: Diagnosis not present

## 2018-12-31 DIAGNOSIS — K8020 Calculus of gallbladder without cholecystitis without obstruction: Secondary | ICD-10-CM | POA: Diagnosis not present

## 2018-12-31 DIAGNOSIS — K838 Other specified diseases of biliary tract: Secondary | ICD-10-CM | POA: Diagnosis not present

## 2018-12-31 DIAGNOSIS — Z79899 Other long term (current) drug therapy: Secondary | ICD-10-CM | POA: Diagnosis not present

## 2018-12-31 DIAGNOSIS — Z823 Family history of stroke: Secondary | ICD-10-CM | POA: Diagnosis not present

## 2018-12-31 DIAGNOSIS — K859 Acute pancreatitis without necrosis or infection, unspecified: Secondary | ICD-10-CM | POA: Diagnosis not present

## 2018-12-31 DIAGNOSIS — Z87891 Personal history of nicotine dependence: Secondary | ICD-10-CM | POA: Diagnosis not present

## 2018-12-31 DIAGNOSIS — I714 Abdominal aortic aneurysm, without rupture: Secondary | ICD-10-CM | POA: Diagnosis not present

## 2018-12-31 DIAGNOSIS — I10 Essential (primary) hypertension: Secondary | ICD-10-CM | POA: Diagnosis not present

## 2018-12-31 DIAGNOSIS — Z6830 Body mass index (BMI) 30.0-30.9, adult: Secondary | ICD-10-CM | POA: Diagnosis not present

## 2018-12-31 DIAGNOSIS — Z9049 Acquired absence of other specified parts of digestive tract: Secondary | ICD-10-CM | POA: Diagnosis not present

## 2018-12-31 DIAGNOSIS — I350 Nonrheumatic aortic (valve) stenosis: Secondary | ICD-10-CM | POA: Diagnosis not present

## 2018-12-31 DIAGNOSIS — Z955 Presence of coronary angioplasty implant and graft: Secondary | ICD-10-CM | POA: Diagnosis not present

## 2019-01-03 DIAGNOSIS — R21 Rash and other nonspecific skin eruption: Secondary | ICD-10-CM | POA: Diagnosis not present

## 2019-01-03 DIAGNOSIS — L309 Dermatitis, unspecified: Secondary | ICD-10-CM | POA: Diagnosis not present

## 2019-02-12 ENCOUNTER — Telehealth: Payer: Self-pay

## 2019-02-12 ENCOUNTER — Other Ambulatory Visit: Payer: Self-pay

## 2019-02-12 NOTE — Telephone Encounter (Signed)
Faxed request for protonix sent to PCP at [facility]

## 2019-03-11 ENCOUNTER — Telehealth: Payer: Self-pay | Admitting: Cardiology

## 2019-03-11 MED ORDER — ATORVASTATIN CALCIUM 40 MG PO TABS: 40.0000 mg | ORAL_TABLET | Freq: Every day | ORAL | 2 refills | Status: DC

## 2019-03-11 MED ORDER — METOPROLOL TARTRATE 25 MG PO TABS: 25.0000 mg | ORAL_TABLET | Freq: Two times a day (BID) | ORAL | 2 refills | Status: DC

## 2019-03-11 NOTE — Telephone Encounter (Signed)
Refill for atorvastatin and metoprolol sent

## 2019-03-17 ENCOUNTER — Other Ambulatory Visit: Payer: Self-pay

## 2019-03-17 MED ORDER — METOPROLOL TARTRATE 25 MG PO TABS: 25.0000 mg | ORAL_TABLET | Freq: Two times a day (BID) | ORAL | 2 refills | Status: DC

## 2019-03-17 MED ORDER — ATORVASTATIN CALCIUM 40 MG PO TABS: 40.0000 mg | ORAL_TABLET | Freq: Every day | ORAL | 2 refills | Status: DC

## 2019-04-30 ENCOUNTER — Other Ambulatory Visit: Payer: Self-pay

## 2019-04-30 ENCOUNTER — Ambulatory Visit (INDEPENDENT_AMBULATORY_CARE_PROVIDER_SITE_OTHER): Payer: BC Managed Care – PPO | Admitting: Cardiology

## 2019-04-30 ENCOUNTER — Encounter: Payer: Self-pay | Admitting: Cardiology

## 2019-04-30 VITALS — BP 136/76 | HR 81 | Wt 224.0 lb

## 2019-04-30 DIAGNOSIS — Z9889 Other specified postprocedural states: Secondary | ICD-10-CM | POA: Diagnosis not present

## 2019-04-30 DIAGNOSIS — Z8679 Personal history of other diseases of the circulatory system: Secondary | ICD-10-CM

## 2019-04-30 DIAGNOSIS — Z952 Presence of prosthetic heart valve: Secondary | ICD-10-CM

## 2019-04-30 DIAGNOSIS — Z87891 Personal history of nicotine dependence: Secondary | ICD-10-CM

## 2019-04-30 DIAGNOSIS — E782 Mixed hyperlipidemia: Secondary | ICD-10-CM

## 2019-04-30 DIAGNOSIS — Z1329 Encounter for screening for other suspected endocrine disorder: Secondary | ICD-10-CM

## 2019-04-30 DIAGNOSIS — I2510 Atherosclerotic heart disease of native coronary artery without angina pectoris: Secondary | ICD-10-CM

## 2019-04-30 NOTE — Addendum Note (Signed)
Addended by: Beckey Rutter on: 04/30/2019 04:05 PM   Modules accepted: Orders

## 2019-04-30 NOTE — Patient Instructions (Signed)
Medication Instructions:  Your physician recommends that you continue on your current medications as directed. Please refer to the Current Medication list given to you today.  If you need a refill on your cardiac medications before your next appointment, please call your pharmacy.   Lab work: Your physician recommends that you FASTING BMP, CBC, TSH, Lipid and hepatic.   If you have labs (blood work) drawn today and your tests are completely normal, you will receive your results only by: Marland Kitchen. MyChart Message (if you have MyChart) OR . A paper copy in the mail If you have any lab test that is abnormal or we need to change your treatment, we will call you to review the results.  Testing/Procedures: You had an EKG performed today  Your physician has requested that you have an echocardiogram. Echocardiography is a painless test that uses sound waves to create images of your heart. It provides your doctor with information about the size and shape of your heart and how well your heart's chambers and valves are working. This procedure takes approximately one hour. There are no restrictions for this procedure.  Follow-Up: At [facility], you and your health needs are our priority.  As part of our continuing mission to provide you with exceptional heart care, we have created designated Provider Care Teams.  These Care Teams include your primary Cardiologist (physician) and Advanced Practice Providers (APPs -  Physician Assistants and Nurse Practitioners) who all work together to provide you with the care you need, when you need it. You will need a follow up appointment in 6 months.    Any Other Special Instructions Will Be Listed Below   Echocardiogram An echocardiogram is a procedure that uses painless sound waves (ultrasound) to produce an image of the heart. Images from an echocardiogram can provide important information about:  Signs of coronary artery disease (CAD).  Aneurysm detection. An  aneurysm is a weak or damaged part of an artery wall that bulges out from the normal force of blood pumping through the body.  Heart size and shape. Changes in the size or shape of the heart can be associated with certain conditions, including heart failure, aneurysm, and CAD.  Heart muscle function.  Heart valve function.  Signs of a past heart attack.  Fluid buildup around the heart.  Thickening of the heart muscle.  A tumor or infectious growth around the heart valves. Tell a health care provider about:  Any allergies you have.  All medicines you are taking, including vitamins, herbs, eye drops, creams, and over-the-counter medicines.  Any blood disorders you have.  Any surgeries you have had.  Any medical conditions you have.  Whether you are pregnant or may be pregnant. What are the risks? Generally, this is a safe procedure. However, problems may occur, including:  Allergic reaction to dye (contrast) that may be used during the procedure. What happens before the procedure? No specific preparation is needed. You may eat and drink normally. What happens during the procedure?   An IV tube may be inserted into one of your veins.  You may receive contrast through this tube. A contrast is an injection that improves the quality of the pictures from your heart.  A gel will be applied to your chest.  A wand-like tool (transducer) will be moved over your chest. The gel will help to transmit the sound waves from the transducer.  The sound waves will harmlessly bounce off of your heart to allow the heart images to  be captured in real-time motion. The images will be recorded on a computer. The procedure may vary among health care providers and hospitals. What happens after the procedure?  You may return to your normal, everyday life, including diet, activities, and medicines, unless your health care provider tells you not to do that. Summary  An echocardiogram is a  procedure that uses painless sound waves (ultrasound) to produce an image of the heart.  Images from an echocardiogram can provide important information about the size and shape of your heart, heart muscle function, heart valve function, and fluid buildup around your heart.  You do not need to do anything to prepare before this procedure. You may eat and drink normally.  After the echocardiogram is completed, you may return to your normal, everyday life, unless your health care provider tells you not to do that. This information is not intended to replace advice given to you by your health care provider. Make sure you discuss any questions you have with your health care provider. Document Released: 07/28/2000 Document Revised: 11/21/2018 Document Reviewed: 09/02/2016 Elsevier Patient Education  2020 [employer].

## 2019-04-30 NOTE — Progress Notes (Signed)
Cardiology Office Note:    Date:  04/30/2019   ID:  Tyler Kemp, DOB 04/18/1961, MRN [id number]  PCP:  Practice, [facility]  Cardiologist:  Garwin Brothersajan R Athalene Kolle, MD   Referring MD: [facility]    ASSESSMENT:    1. Coronary artery disease involving native coronary artery of native heart without angina pectoris   2. S/P AVR   3. S/P aortic aneurysm repair   4. Ex-smoker    PLAN:    In order of problems listed above:  1. Coronary artery disease: Secondary prevention stressed with the patient.  Importance of compliance with diet and medication stressed and he vocalized understanding. 2. Essential hypertension: His blood pressure stable Mixed dyslipidemia: Diet was discussed.  He will be back in the next few days for liver lipid check, monitor labs Status post aortic valve replacement: Stable clinically.  We will get an echocardiogram to assess this as a follow-up and baseline Patient will be seen in follow-up appointment in 6 months or earlier if the patient has any concerns    Medication Adjustments/Labs and Tests Ordered: Current medicines are reviewed at length with the patient today.  Concerns regarding medicines are outlined above.  No orders of the defined types were placed in this encounter.  No orders of the defined types were placed in this encounter.    No chief complaint on file.    History of Present Illness:    Tyler RichterDavid Okelly is a 58 y.o. male patient has past medical history of coronary artery disease, aortic aneurysm repair.  He has aortic valve replacement surgery.  He denies any chest pain orthopnea or PND.  He has quit smoking completely.  At the time of my evaluation, the patient is alert awake oriented and in no distress.  Past Medical History:  Diagnosis Date  . Coronary artery disease   . Food impaction of esophagus 12/11/2017  . GERD (gastroesophageal reflux disease)   . Hiatal hernia   . Pneumonia   . PONV (postoperative  nausea and vomiting)   . Severe aortic stenosis   . Thoracic aortic aneurysm [facility])     Past Surgical History:  Procedure Laterality Date  . BENTALL PROCEDURE N/A 02/19/2018   Procedure: BENTALL PROCEDURE;  Surgeon: Alleen BorneBartle, Bryan K, MD;  [facility];  Service: Open Heart Surgery;  Laterality: N/A;  RIGHT AXILLARY CANNULATION  CIRC ARREST  BILATERAL RADIAL ARTERIAL LINES  . CHOLECYSTECTOMY    . CORONARY ARTERY BYPASS GRAFT N/A 02/19/2018   Procedure: CORONARY ARTERY BYPASS GRAFTING (CABG)x3. LIMA TO LAD. SVG TO PL. SVG to OM. SAPHENOUS VEIN HARVEST.;  Surgeon: Alleen BorneBartle, Bryan K, MD;  Location: [facility] OR;  Service: Open Heart Surgery;  Laterality: N/A;  . ESOPHAGOGASTRODUODENOSCOPY (EGD) WITH PROPOFOL N/A 12/11/2017   Procedure: ESOPHAGOGASTRODUODENOSCOPY (EGD) WITH PROPOFOL;  Surgeon: Iva BoopGessner, Carl E, MD;  [facility];  Service: Endoscopy;  Laterality: N/A;  . FOREIGN BODY REMOVAL N/A 12/11/2017   Procedure: FOREIGN BODY REMOVAL;  Surgeon: Iva BoopGessner, Carl E, MD;  [facility];  Service: Endoscopy;  Laterality: N/A;  . MULTIPLE TOOTH EXTRACTIONS    . REPLACEMENT ASCENDING AORTA N/A 02/19/2018   Procedure: REPLACEMENT ASCENDING AORTA;  Surgeon: Alleen BorneBartle, Bryan K, MD;  Location: [facility] OR;  Service: Open Heart Surgery;  Laterality: N/A;  . RIGHT/LEFT HEART CATH AND CORONARY ANGIOGRAPHY N/A 12/04/2017   Procedure: RIGHT/LEFT HEART CATH AND CORONARY ANGIOGRAPHY;  Surgeon: Lyn RecordsSmith, Henry W, MD;  Location: [facility] INVASIVE CV LAB;  Service: Cardiovascular;  Laterality:  N/A;  . TEE WITHOUT CARDIOVERSION N/A 02/19/2018   Procedure: TRANSESOPHAGEAL ECHOCARDIOGRAM (TEE);  Surgeon: Gaye Pollack, MD;  Location: [place];  Service: Open Heart Surgery;  Laterality: N/A;    Current Medications: Current Meds  Medication Sig  . aspirin EC 81 MG tablet Take 81 mg by mouth daily.  Marland Kitchen atorvastatin (LIPITOR) 40 MG tablet Take 1 tablet (40 mg total) by mouth daily at 6 PM.  . gabapentin (NEURONTIN) 100 MG capsule  Take 1 capsule (100 mg total) by mouth 3 (three) times daily.  . metoprolol tartrate (LOPRESSOR) 25 MG tablet Take 1 tablet (25 mg total) by mouth 2 (two) times daily.  . ondansetron (ZOFRAN-ODT) 8 MG disintegrating tablet Take by mouth.  . pantoprazole (PROTONIX) 40 MG tablet Take 1 tablet (40 mg total) by mouth daily. (Patient taking differently: Take 40 mg by mouth daily as needed (acid reflux). )  . predniSONE (DELTASONE) 10 MG tablet TAKE 6 TABLETS BY MOUTH DAILY FOR 5 DAYS     Allergies:   Patient has no known allergies.   Social History   Socioeconomic History  . Marital status: Single    Spouse name: Not on file  . Number of children: Not on file  . Years of education: Not on file  . Highest education level: Not on file  Occupational History  . Not on file  Social Needs  . Financial resource strain: Not on file  . Food insecurity    Worry: Not on file    Inability: Not on file  . Transportation needs    Medical: Not on file    Non-medical: Not on file  Tobacco Use  . Smoking status: Former Smoker    Types: Cigarettes    Quit date: 09/14/2017    Years since quitting: 1.6  . Smokeless tobacco: Never Used  Substance and Sexual Activity  . Alcohol use: Yes    Comment: occasional on weekends  . Drug use: Never  . Sexual activity: Not on file  Lifestyle  . Physical activity    Days per week: Not on file    Minutes per session: Not on file  . Stress: Not on file  Relationships  . Social Herbalist on phone: Not on file    Gets together: Not on file    Attends religious service: Not on file    Active member of club or organization: Not on file    Attends meetings of clubs or organizations: Not on file    Relationship status: Not on file  Other Topics Concern  . Not on file  Social History Narrative  . Not on file     Family History: The patient's family history includes Diabetes in his mother; Throat cancer in his father.  ROS:   Please see the  history of present illness.    All other systems reviewed and are negative.  EKGs/Labs/Other Studies Reviewed:    The following studies were reviewed today: I reviewed previous records with him at length   Recent Labs: 11/01/2018: ALT 22; BUN 13; Creatinine, Ser 0.82; Hemoglobin 13.5; Platelets 187; Potassium 4.5; Sodium 135; TSH 1.330  Recent Lipid Panel    Component Value Date/Time   CHOL 159 11/01/2018 0817   TRIG 79 11/01/2018 0817   HDL 56 11/01/2018 0817   CHOLHDL 2.8 11/01/2018 0817   LDLCALC 87 11/01/2018 0817    Physical Exam:    VS:  BP 136/76 (BP Location: Left Arm, Patient Position:  Sitting, Cuff Size: Normal)   Pulse 81   Wt 224 lb (101.6 kg)   SpO2 99%   BMI 35.08 kg/m     Wt Readings from Last 3 Encounters:  04/30/19 224 lb (101.6 kg)  10/28/18 219 lb (99.3 kg)  07/05/18 227 lb (103 kg)     GEN: Patient is in no acute distress HEENT: Normal NECK: No JVD; No carotid bruits LYMPHATICS: No lymphadenopathy CARDIAC: Hear sounds regular, 2/6 systolic murmur at the apex. RESPIRATORY:  Clear to auscultation without rales, wheezing or rhonchi  ABDOMEN: Soft, non-tender, non-distended MUSCULOSKELETAL:  No edema; No deformity  SKIN: Warm and dry NEUROLOGIC:  Alert and oriented x 3 PSYCHIATRIC:  Normal affect   Signed, Garwin Brothers, MD  04/30/2019 3:44 PM    [facility]

## 2019-05-16 DIAGNOSIS — Z23 Encounter for immunization: Secondary | ICD-10-CM | POA: Diagnosis not present

## 2019-05-26 ENCOUNTER — Other Ambulatory Visit: Payer: Self-pay | Admitting: Surgery

## 2019-05-26 DIAGNOSIS — I712 Thoracic aortic aneurysm, without rupture, unspecified: Secondary | ICD-10-CM

## 2019-05-26 DIAGNOSIS — Z8679 Personal history of other diseases of the circulatory system: Secondary | ICD-10-CM

## 2019-05-30 ENCOUNTER — Other Ambulatory Visit: Payer: Self-pay

## 2019-05-30 ENCOUNTER — Ambulatory Visit (INDEPENDENT_AMBULATORY_CARE_PROVIDER_SITE_OTHER): Payer: BC Managed Care – PPO

## 2019-05-30 DIAGNOSIS — E782 Mixed hyperlipidemia: Secondary | ICD-10-CM | POA: Diagnosis not present

## 2019-05-30 DIAGNOSIS — Z9889 Other specified postprocedural states: Secondary | ICD-10-CM

## 2019-05-30 DIAGNOSIS — Z952 Presence of prosthetic heart valve: Secondary | ICD-10-CM | POA: Diagnosis not present

## 2019-05-30 DIAGNOSIS — I2510 Atherosclerotic heart disease of native coronary artery without angina pectoris: Secondary | ICD-10-CM | POA: Diagnosis not present

## 2019-05-30 DIAGNOSIS — Z8679 Personal history of other diseases of the circulatory system: Secondary | ICD-10-CM | POA: Diagnosis not present

## 2019-05-30 NOTE — Progress Notes (Signed)
Complete echocardiogram has been performed.  Jimmy Fallan Mccarey RDCS, RVT 

## 2019-05-31 LAB — LIPID PANEL
Chol/HDL Ratio: 2.4 ratio (ref 0.0–5.0)
Cholesterol, Total: 132 mg/dL (ref 100–199)
HDL: 54 mg/dL (ref >39)
LDL Chol Calc (NIH): 53 mg/dL (ref 0–99)
Triglycerides: 146 mg/dL (ref 0–149)
VLDL Cholesterol Cal: 25 mg/dL (ref 5–40)

## 2019-05-31 LAB — BASIC METABOLIC PANEL
BUN/Creatinine Ratio: 16 (ref 10–24)
BUN: 14 mg/dL (ref 8–27)
CO2: 24 mmol/L (ref 20–29)
Calcium: 9.2 mg/dL (ref 8.6–10.2)
Chloride: 103 mmol/L (ref 96–106)
Creatinine, Ser: 0.85 mg/dL (ref 0.76–1.27)
GFR calc Af Amer: 109 mL/min/{1.73_m2} (ref >59)
GFR calc non Af Amer: 95 mL/min/{1.73_m2} (ref >59)
Glucose: 67 mg/dL (ref 65–99)
Potassium: 4.4 mmol/L (ref 3.5–5.2)
Sodium: 140 mmol/L (ref 134–144)

## 2019-05-31 LAB — CBC
Hematocrit: 40.7 % (ref 37.5–51.0)
Hemoglobin: 13.8 g/dL (ref 13.0–17.7)
MCH: 29.6 pg (ref 26.6–33.0)
MCHC: 33.9 g/dL (ref 31.5–35.7)
MCV: 87 fL (ref 79–97)
Platelets: 160 10*3/uL (ref 150–450)
RBC: 4.67 x10E6/uL (ref 4.14–5.80)
RDW: 13 % (ref 11.6–15.4)
WBC: 5.5 10*3/uL (ref 3.4–10.8)

## 2019-05-31 LAB — HEPATIC FUNCTION PANEL
ALT: 20 IU/L (ref 0–44)
AST: 25 IU/L (ref 0–40)
Albumin: 4.1 g/dL (ref 3.8–4.9)
Alkaline Phosphatase: 69 IU/L (ref 39–117)
Bilirubin Total: 0.4 mg/dL (ref 0.0–1.2)
Bilirubin, Direct: 0.1 mg/dL (ref 0.00–0.40)
Total Protein: 6 g/dL (ref 6.0–8.5)

## 2019-05-31 LAB — TSH: TSH: 1.26 u[IU]/mL (ref 0.450–4.500)

## 2019-06-04 ENCOUNTER — Telehealth: Payer: Self-pay

## 2019-06-04 NOTE — Telephone Encounter (Signed)
-----   Message from Jenean Lindau, MD sent at 06/04/2019  2:07 PM EDT ----- The results of the study is unremarkable. Please inform patient. I will discuss in detail at next appointment. Cc  primary care/referring physician Jenean Lindau, MD 06/04/2019 2:06 PM

## 2019-06-04 NOTE — Telephone Encounter (Signed)
Results relayed, copy sent to [facility].

## 2019-06-30 ENCOUNTER — Other Ambulatory Visit: Payer: Self-pay | Admitting: Surgery

## 2019-07-02 ENCOUNTER — Encounter: Payer: Self-pay | Admitting: Surgery

## 2019-07-02 ENCOUNTER — Ambulatory Visit (INDEPENDENT_AMBULATORY_CARE_PROVIDER_SITE_OTHER): Payer: BC Managed Care – PPO | Admitting: Surgery

## 2019-07-02 ENCOUNTER — Ambulatory Visit
Admission: RE | Admit: 2019-07-02 | Discharge: 2019-07-02 | Disposition: A | Discharge status: Home / Self Care | Payer: BC Managed Care – PPO | Source: Ambulatory Visit | Attending: Surgery | Admitting: Surgery

## 2019-07-02 ENCOUNTER — Other Ambulatory Visit: Payer: Self-pay

## 2019-07-02 VITALS — BP 136/77 | HR 83 | Temp 97.7°F | Ht 67.0 in | Wt 227.8 lb

## 2019-07-02 DIAGNOSIS — I712 Thoracic aortic aneurysm, without rupture, unspecified: Secondary | ICD-10-CM

## 2019-07-02 DIAGNOSIS — R918 Other nonspecific abnormal finding of lung field: Secondary | ICD-10-CM | POA: Diagnosis not present

## 2019-07-02 MED ORDER — IOPAMIDOL (ISOVUE-370) INJECTION 76%
75.0000 mL | Freq: Once | INTRAVENOUS | Status: AC | PRN
  Administered 2019-07-02: 13:00:00 75 mL via INTRAVENOUS

## 2019-07-02 NOTE — Progress Notes (Signed)
HPI:  The patient returns for follow-up status post placement of an ascending aortic aneurysm under deep hypothermic circulatory arrest using right axillary artery cannulation, aortic valve replacement with a 25 mmINSPIRIS RESILIA aortic valve,Wheat procedure (supra-coronary aortic anastomosis)and coronary bypass graft surgery x3 02/19/2018.  He has been feeling well overall without chest pain or shortness of breath and said that his stamina is much better than it was preoperatively.  He has been working 40 to 50 hours/week without difficulty.  His only complaint is that he still has some signs of autonomic dysfunction in the right upper extremity.  He describes that his right hand sometimes will become flushed and purplish red with sweating and then sometimes will be cool and white.  He feels like his strength is back to normal and he no longer has any pain or numbness.  Current Outpatient Medications  Medication Sig Dispense Refill  . aspirin EC 81 MG tablet Take 81 mg by mouth daily.    Marland Kitchen atorvastatin (LIPITOR) 40 MG tablet Take 1 tablet (40 mg total) by mouth daily at 6 PM. 30 tablet 2  . metoprolol tartrate (LOPRESSOR) 25 MG tablet Take 1 tablet (25 mg total) by mouth 2 (two) times daily. 60 tablet 2  . gabapentin (NEURONTIN) 100 MG capsule Take 1 capsule (100 mg total) by mouth 3 (three) times daily. (Patient not taking: Reported on 07/02/2019) 90 capsule 6   No current facility-administered medications for this visit.      Physical Exam: BP 136/77 (BP Location: Right Arm)   Pulse 83   Temp 97.7 F (36.5 C) (Skin)   Ht 5\' 7"  (1.702 m)   Wt 227 lb 12.8 oz (103.3 kg)   SpO2 97% Comment: RA  BMI 35.68 kg/m  He looks well. Cardiac exam shows a regular rate and rhythm with normal heart sounds.  There is no murmur. Lungs are clear. The chest incision is well-healed and the sternum is stable. His grip strength is equal bilaterally.  The right hand presently looks normal with a  strong right radial pulse.  The right subclavicular incision looks well-healed.  Diagnostic Tests:   ECHOCARDIOGRAM REPORT       Patient Name:   Tyler Kemp Date of Exam: 05/30/2019 Medical Rec #:  06/01/2019      Height:       67.0 in Accession #:    [id number]     Weight:       224.0 lb Date of Birth:  November 11, 1960       BSA:          2.12 m Patient Age:    58 years       BP:           136/76 mmHg Patient Gender: M              HR:           69 bpm. Exam Location:  [place]  Procedure: 2D Echo  Indications:    Aortic Valve Disorder 424.1 / I35.9   History:        Patient has prior history of Echocardiogram examinations, most                 recent 11/22/2017. S/P aortic aneurysm repair; S/P AVR Risk                 Factors:Former Smoker and Dyslipidemia. Aortic Valve: A a  bioprosthetic aortic valve   Sonographer:    Jeneen Rinks Reel Referring Phys: Waverly Ferrari Bell    1. Left ventricular ejection fraction, by visual estimation, is 60 to 65%. The left ventricle has normal function. Normal left ventricular size. There is moderately increased left ventricular hypertrophy.  2. Global right ventricle has normal systolic function.The right ventricular size is normal. No increase in right ventricular wall thickness.  3. Left atrial size was moderately dilated.  4. Right atrial size was normal.  5. Moderate thickening of the anterior mitral valve leaflet(s).  6. The mitral valve is normal in structure. No evidence of mitral valve regurgitation. No evidence of mitral stenosis.  7. The tricuspid valve is normal in structure. Tricuspid valve regurgitation was not visualized by color flow Dopple  8.  9. Aorta is post repair. 10. Bioprosthetic aortiv valve with satisfactory function.  FINDINGS  Left Ventricle: Left ventricular ejection fraction, by visual estimation, is 60 to 65%. The left ventricle has normal function. There is moderately increased  left ventricular hypertrophy. Normal left ventricular size.  Right Ventricle: The right ventricular size is normal. No increase in right ventricular wall thickness. Global RV systolic function is has normal systolic function. The tricuspid regurgitant velocity is 2.14 m/s, and with an assumed right atrial pressure  of 3 mmHg, the estimated right ventricular systolic pressure is normal at 21.3 mmHg.  Left Atrium: Left atrial size was moderately dilated.  Right Atrium: Right atrial size was normal in size  Pericardium: There is no evidence of pericardial effusion.  Mitral Valve: The mitral valve is normal in structure. There is moderate thickening of the anterior mitral valve leaflet(s). No evidence of mitral valve stenosis by observation. No evidence of mitral valve regurgitation.  Tricuspid Valve: The tricuspid valve is normal in structure. Tricuspid valve regurgitation was not visualized by color flow Doppler.  Aortic Valve: The aortic valve has been repaired/replaced. Aortic valve regurgitation was not visualized by color flow Doppler. The aortic valve is structurally normal, with no evidence of sclerosis or stenosis. Aortic valve mean gradient measures 13.0  mmHg. Aortic valve peak gradient measures 22.5 mmHg. Aortic valve area, by VTI measures 1.37 cm. A bioprosthetic aortic valve valve is present in the aortic position. Bioprosthetic aortiv valve with satisfactory function.  Pulmonic Valve: The pulmonic valve was normal in structure. Pulmonic valve regurgitation is not visualized by color flow Doppler.  Aorta: The aortic root, ascending aorta and aortic arch are all structurally normal, with no evidence of dilitation or obstruction. There is moderate dilatation of the ascending aorta measuring 42 mm. Aorta is post repair.  Venous: The inferior vena cava is normal in size with greater than 50% respiratory variability, suggesting right atrial pressure of 3 mmHg.  IAS/Shunts: No  atrial level shunt detected by color flow Doppler. No ventricular septal defect is seen or detected. There is no evidence of an atrial septal defect.     LEFT VENTRICLE PLAX 2D LVIDd:         5.20 cm       Diastology LVIDs:         2.80 cm       LV e' lateral:   11.40 cm/s LV PW:         1.30 cm       LV E/e' lateral: 9.0 LV IVS:        1.40 cm       LV e' medial:    7.51 cm/s LVOT diam:  2.00 cm       LV E/e' medial:  13.7 LV SV:         100 ml LV SV Index:   44.79 LVOT Area:     3.14 cm   LV Volumes (MOD) LV area d, A2C:    36.70 cm LV area d, A4C:    37.30 cm LV area s, A2C:    25.50 cm LV area s, A4C:    24.80 cm LV major d, A2C:   9.47 cm LV major d, A4C:   9.40 cm LV major s, A2C:   8.96 cm LV major s, A4C:   8.49 cm LV vol d, MOD A2C: 121.0 ml LV vol d, MOD A4C: 121.0 ml LV vol s, MOD A2C: 68.7 ml LV vol s, MOD A4C: 63.7 ml LV SV MOD A2C:     52.3 ml LV SV MOD A4C:     121.0 ml LV SV MOD BP:      53.8 ml  RIGHT VENTRICLE            IVC RV S prime:     6.75 cm/s  IVC diam: 1.20 cm TAPSE (M-mode): 1.6 cm  LEFT ATRIUM              Index       RIGHT ATRIUM           Index LA diam:        4.70 cm  2.21 cm/m  RA Area:     18.40 cm LA Vol (A2C):   111.0 ml 52.31 ml/m RA Volume:   51.20 ml  24.13 ml/m LA Vol (A4C):   105.0 ml 49.48 ml/m LA Biplane Vol: 111.0 ml 52.31 ml/m  AORTIC VALVE AV Area (Vmax):    1.20 cm AV Area (Vmean):   1.24 cm AV Area (VTI):     1.37 cm AV Vmax:           237.00 cm/s AV Vmean:          170.000 cm/s AV VTI:            0.458 m AV Peak Grad:      22.5 mmHg AV Mean Grad:      13.0 mmHg LVOT Vmax:         90.20 cm/s LVOT Vmean:        67.200 cm/s LVOT VTI:          0.200 m LVOT/AV VTI ratio: 0.44   AORTA Ao Root diam: 3.50 cm Ao Asc diam:  4.20 cm  MITRAL VALVE                         TRICUSPID VALVE MV Area (PHT): 4.68 cm              TR Peak grad:   18.3 mmHg MV PHT:        46.98 msec            TR Vmax:         214.00 cm/s MV Decel Time: 162 msec MV E velocity: 103.00 cm/s 103 cm/s  SHUNTS MV A velocity: 74.70 cm/s  70.3 cm/s Systemic VTI:  0.20 m MV E/A ratio:  1.38        1.5       Systemic Diam: 2.00 cm    Belva Cromeajan Revankar MD Electronically signed by Belva Cromeajan Revankar MD Signature Date/Time: 05/30/2019/5:35:51 PM      CLINICAL  DATA:  Status post Wheat procedure, follow-up examination  EXAM: CT ANGIOGRAPHY CHEST WITH CONTRAST  TECHNIQUE: Multidetector CT imaging of the chest was performed using the standard protocol during bolus administration of intravenous contrast. Multiplanar CT image reconstructions and MIPs were obtained to evaluate the vascular anatomy.  CONTRAST:  75mL ISOVUE-370 IOPAMIDOL (ISOVUE-370) INJECTION 76%  COMPARISON:  12/10/2017  FINDINGS: Cardiovascular: Initial precontrast images show no evidence of hyperdense crescent within the aorta to suggest intramural hematoma. Post-contrast images demonstrate evidence of aortic valve replacement as well as supra coronary graft placement. No areas of extravasation are identified. The graft is widely patent. Coronary calcifications are seen stable from the prior exam. The aortic arch shows no aneurysmal dilatation. The descending thoracic aorta demonstrates atherosclerotic calcifications without evidence of dissection or aneurysmal dilatation. No significant cardiac enlargement is noted. The pulmonary artery as visualized is within normal limits. Left internal mammary artery graft is noted extending towards the cardiac apex.  Mediastinum/Nodes: The thoracic inlet is unremarkable. No hilar or mediastinal adenopathy is noted. The esophagus is well visualized with evidence of a sliding-type hiatal hernia. Previously seen food bolus within the esophagus has been removed in the interval.  Lungs/Pleura: Small nodule is noted along the major fissure on the left best seen on image number 31 of series 12. This is  stable from the prior exam. Mild area of subpleural scarring and nodularity is noted in the bases bilaterally best seen on image number 110 of series 12. These changes are stable from the prior exam. No other significant pulmonary nodules are seen.  Upper Abdomen: Visualized upper abdomen again demonstrates changes of hiatal hernia. Prior cholecystectomy is seen. The remainder of the upper abdomen is within normal limits.  Musculoskeletal: Degenerative changes of the thoracic spine are seen. No acute bony abnormality is noted.  Review of the MIP images confirms the above findings.  IMPRESSION: Changes consistent with aortic valve replacement as well as supracoronary aortic graft placement. No complicating factors are noted.  No evidence of pulmonary emboli.  Scattered nodules within both lungs stable from the previous exams and consistent with a benign etiology.  Hiatal hernia.  Aortic Atherosclerosis (ICD10-I70.0).   Electronically Signed   By: Alcide Clever M.D.   On: 07/02/2019 13:42  Impression:  He continues to do well following his surgery.  His echocardiogram shows a normally functioning bioprosthetic valve with a mean gradient of 12 mmHg which is what I would expect with his 25 mm valve.  CTA of his chest shows a mildly dilated aortic root that was measured at 4.2 cm.  The ascending aortic graft looks normal.  There is no sign of pseudoaneurysm.  The aortic arch and descending thoracic aorta are normal caliber.  He does have some symptoms of autonomic instability in the right upper extremity most likely due to brachial plexus injury from either the sternotomy incision or the right axillary artery exposure.  His sensory and motor dysfunction that were present postoperatively have completely resolved.  The autonomic dysfunction may take years to resolve.  This should not cause him any long-term disability.  Plan:  I will plan to see him back in 1 year with a CTA  of the chest to follow-up on the remainder of his aorta.  I spent 15 minutes performing this established patient evaluation and > 50% of this time was spent face to face counseling and coordinating the follow-up of his aortic aneurysm repair.    Alleen Borne, MD [facility] [phone]

## 2019-08-29 IMAGING — CT CT ANGIO CHEST
2 of 6 series · 17 of 36 positions shown · IV contrast (APPLIED)
Comparison: None.

CLINICAL DATA: Follow-up thoracic aortic aneurysm

EXAM:
CT ANGIOGRAPHY CHEST WITH CONTRAST
TECHNIQUE: Multidetector CT imaging of the chest was performed using the
standard protocol during bolus administration of intravenous
contrast. Multiplanar CT image reconstructions and MIPs were
obtained to evaluate the vascular anatomy.
CONTRAST:  100mL L14O0L-GPQ IOPAMIDOL (L14O0L-GPQ) INJECTION 76%

[Series 4: axial arterial · axial · arterial · 0.79mm/px · z∈[-330,-75]mm · 16 of 97 slices shown]
[im 6/97  lung]
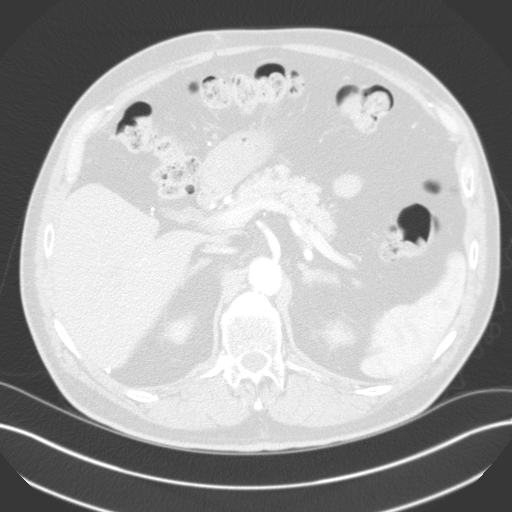
[im 12/97  mediastinal]
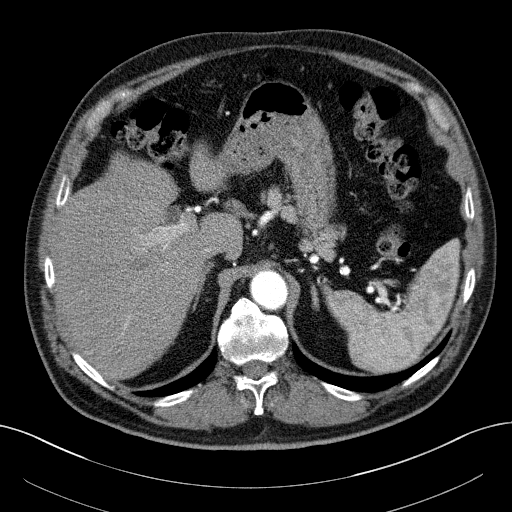
[im 17/97  lung]
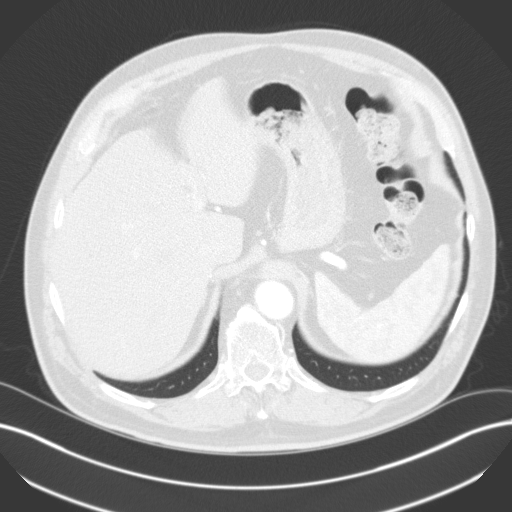
[im 23/97  mediastinal]
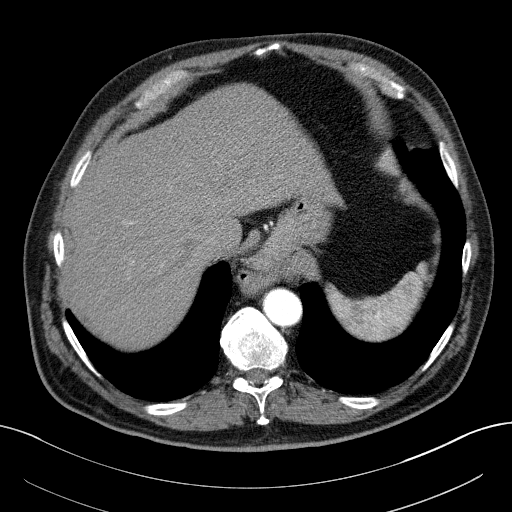
[im 29/97  lung]
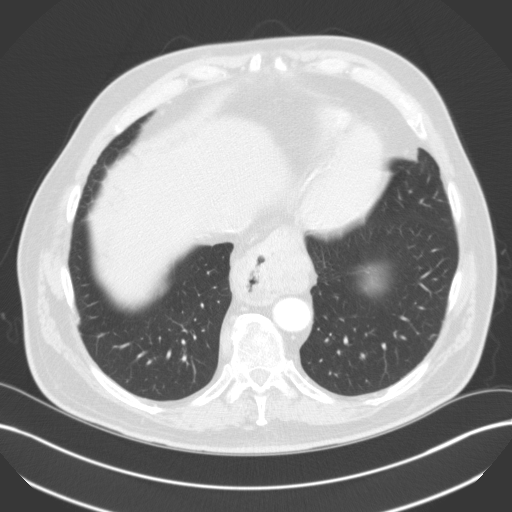
[im 34/97  mediastinal]
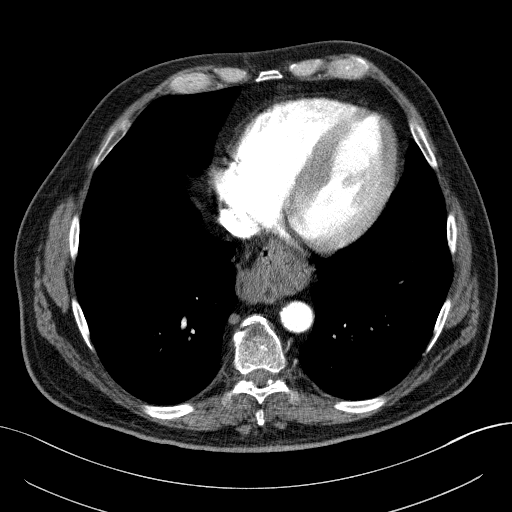
[im 40/97  lung]
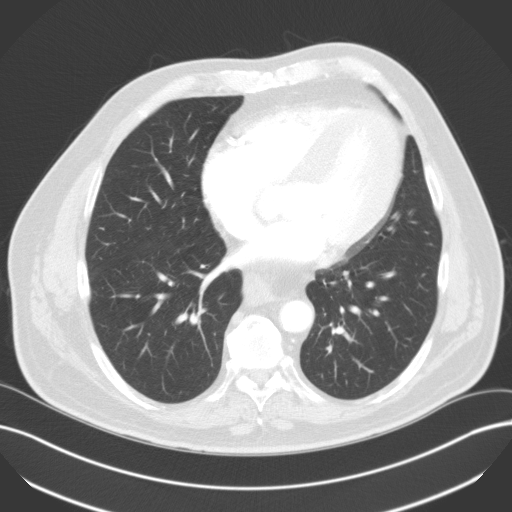
[im 46/97  mediastinal]
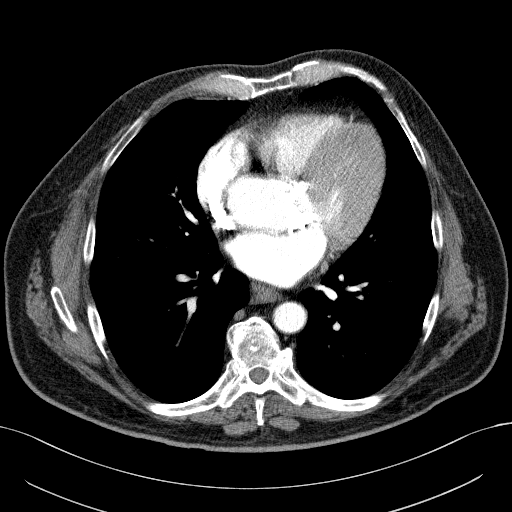
[im 51/97  lung]
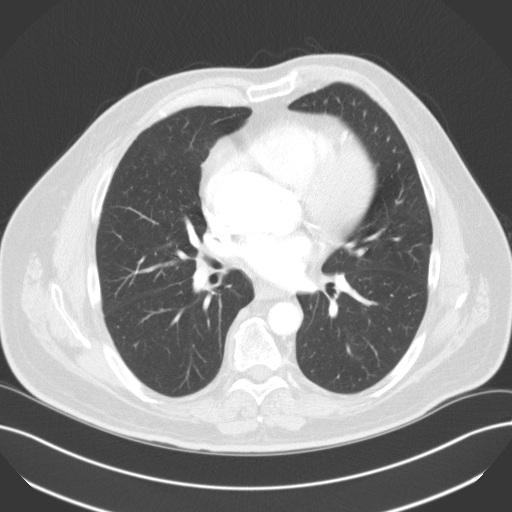
[im 57/97  mediastinal]
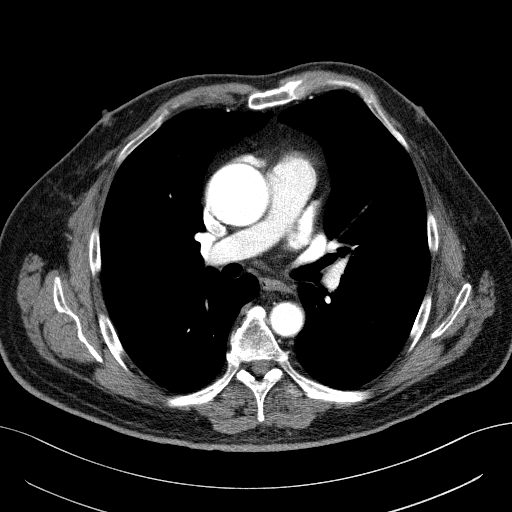
[im 63/97  lung]
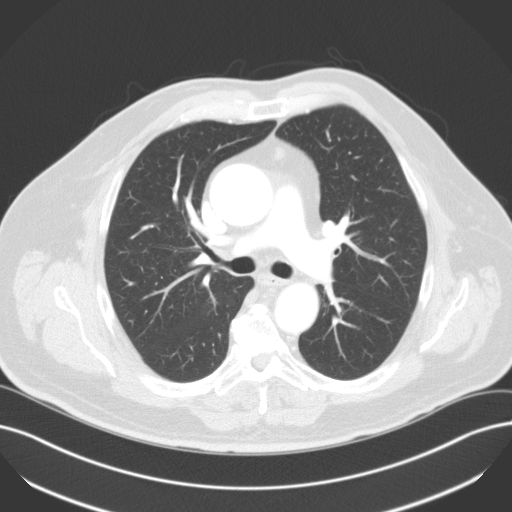
[im 68/97  mediastinal]
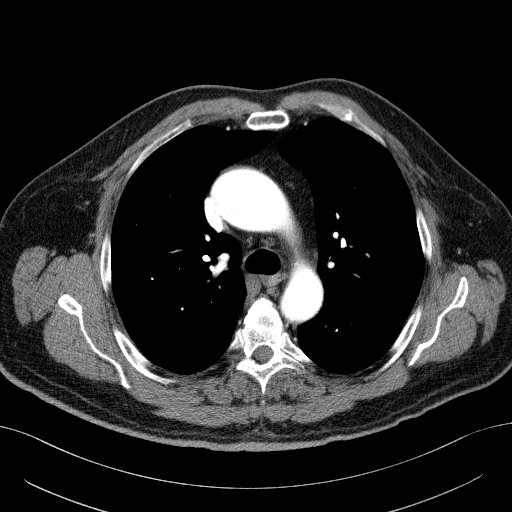
[im 74/97  lung]
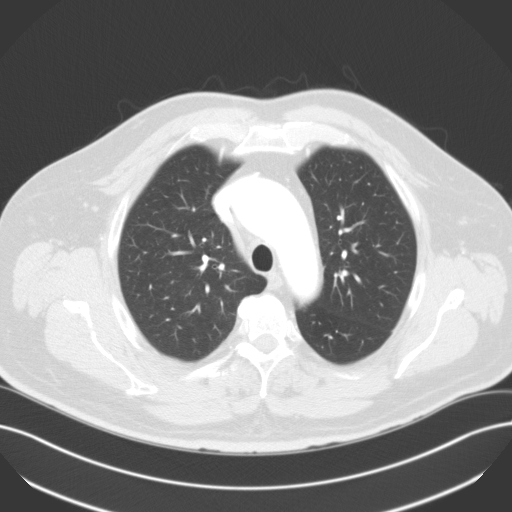
[im 80/97  mediastinal]
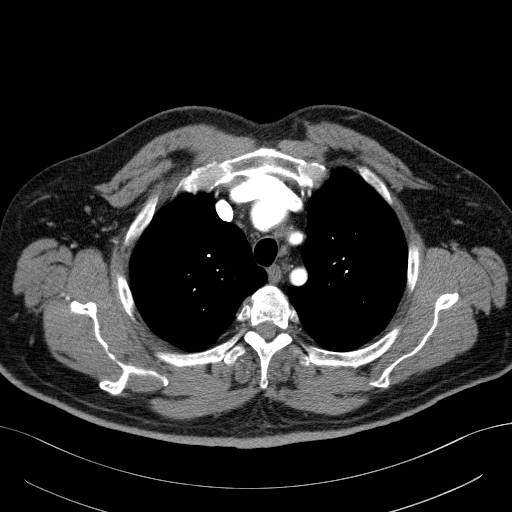
[im 85/97  lung]
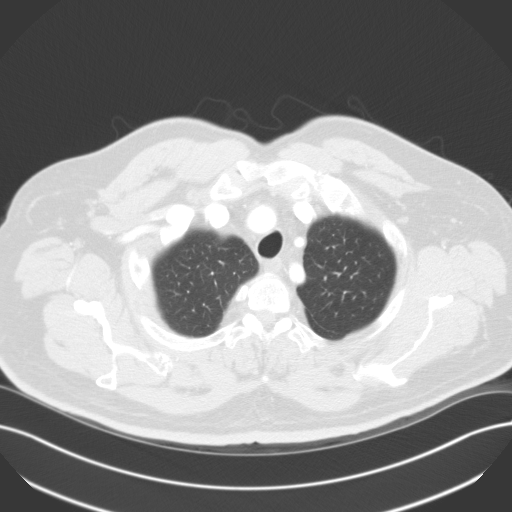
[im 91/97  mediastinal]
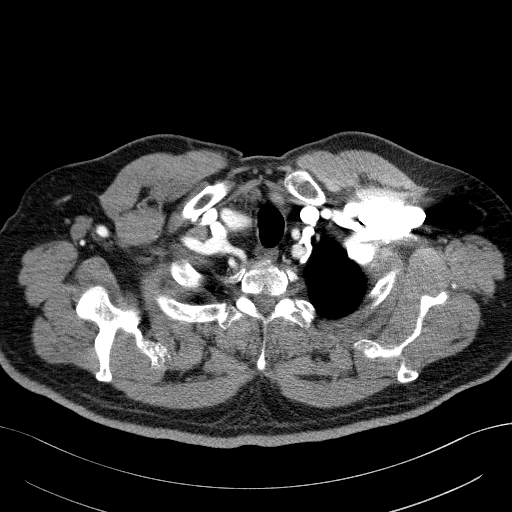

[Series 6: coronal · coronal · 0.61mm/px · 1 of 115 slices shown]
[im 58/115  mediastinal]
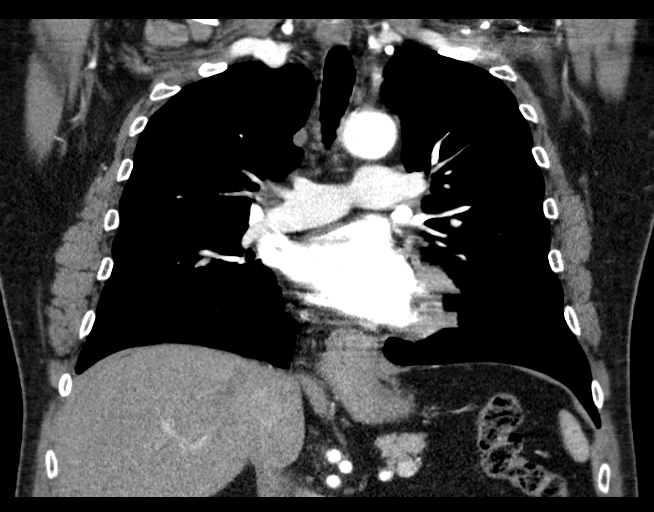

[17 of 36 positions shown; findings below may reference images not displayed]

FINDINGS: Cardiovascular: Ascending thoracic aorta is aneurysmal measuring up
to 4.7 cm. Diffuse aortic valve calcifications. Moderate to severe
coronary artery calcifications within the left anterior descending,
left circumflex and right coronary arteries. Left main coronary
artery calcifications also noted. No evidence of aortic dissection.

Mediastinum/Nodes: Small hiatal hernia. No mediastinal, hilar, or
axillary adenopathy.

Lungs/Pleura: Lungs are clear. No focal airspace opacities or
suspicious nodules. No effusions.

Upper Abdomen: Prior cholecystectomy.  No acute findings.

Musculoskeletal: Chest wall soft tissues are unremarkable. No acute
bony abnormality. Degenerative changes diffusely throughout the
thoracic spine.

Review of the MIP images confirms the above findings.
IMPRESSION: 4.7 cm ascending thoracic aortic aneurysm. Recommend semi-annual
imaging followup by CTA or MRA and referral to cardiothoracic
surgery if not already obtained. This recommendation follows 2494
ACCF/AHA/AATS/ACR/ASA/SCA/TAMA/KLEVER/KLEVER/RENTZ Guidelines for the
Diagnosis and Management of Patients With Thoracic Aortic Disease.
Circulation. 2494; 121: [id number]

Small hiatal hernia.

Extensive coronary artery disease. Aortic valve calcifications
diffusely. Aortic atherosclerosis.

## 2019-09-15 ENCOUNTER — Other Ambulatory Visit: Payer: Self-pay | Admitting: Cardiology

## 2019-09-16 ENCOUNTER — Other Ambulatory Visit: Payer: Self-pay

## 2019-09-16 MED ORDER — GABAPENTIN 100 MG PO CAPS: 100.0000 mg | ORAL_CAPSULE | Freq: Three times a day (TID) | ORAL | 6 refills | Status: DC

## 2019-10-25 ENCOUNTER — Other Ambulatory Visit: Payer: Self-pay | Admitting: Cardiology

## 2019-11-02 DIAGNOSIS — J309 Allergic rhinitis, unspecified: Secondary | ICD-10-CM | POA: Diagnosis not present

## 2019-11-02 DIAGNOSIS — R0981 Nasal congestion: Secondary | ICD-10-CM | POA: Diagnosis not present

## 2019-11-02 DIAGNOSIS — Z20822 Contact with and (suspected) exposure to covid-19: Secondary | ICD-10-CM | POA: Diagnosis not present

## 2019-11-20 DIAGNOSIS — L298 Other pruritus: Secondary | ICD-10-CM | POA: Diagnosis not present

## 2019-11-20 DIAGNOSIS — R21 Rash and other nonspecific skin eruption: Secondary | ICD-10-CM | POA: Diagnosis not present

## 2019-12-05 ENCOUNTER — Encounter: Payer: Self-pay | Admitting: Cardiology

## 2019-12-05 ENCOUNTER — Other Ambulatory Visit: Payer: Self-pay

## 2019-12-05 ENCOUNTER — Ambulatory Visit (INDEPENDENT_AMBULATORY_CARE_PROVIDER_SITE_OTHER): Payer: Medicaid Other | Admitting: Cardiology

## 2019-12-05 VITALS — BP 128/78 | HR 69 | Temp 98.5°F | Ht 72.0 in | Wt 223.6 lb

## 2019-12-05 DIAGNOSIS — Z87891 Personal history of nicotine dependence: Secondary | ICD-10-CM | POA: Diagnosis not present

## 2019-12-05 DIAGNOSIS — Z9889 Other specified postprocedural states: Secondary | ICD-10-CM | POA: Diagnosis not present

## 2019-12-05 DIAGNOSIS — Z8679 Personal history of other diseases of the circulatory system: Secondary | ICD-10-CM

## 2019-12-05 DIAGNOSIS — E782 Mixed hyperlipidemia: Secondary | ICD-10-CM

## 2019-12-05 DIAGNOSIS — I2510 Atherosclerotic heart disease of native coronary artery without angina pectoris: Secondary | ICD-10-CM | POA: Diagnosis not present

## 2019-12-05 DIAGNOSIS — Z952 Presence of prosthetic heart valve: Secondary | ICD-10-CM

## 2019-12-05 MED ORDER — ASPIRIN EC 81 MG PO TBEC: 81.0000 mg | DELAYED_RELEASE_TABLET | Freq: Every day | ORAL | 3 refills | Status: AC

## 2019-12-05 NOTE — Progress Notes (Signed)
Cardiology Office Note:    Date:  12/05/2019   ID:  Tyler Kemp, DOB 1961/01/09, MRN [id number]  PCP:  [facility], [place]  Cardiologist:  Jenean Lindau, MD   Referring MD: Practice, [place] Hong*    ASSESSMENT:    1. Coronary artery disease involving native coronary artery of native heart without angina pectoris   2. Former cigarette smoker   3. Mixed dyslipidemia   4. S/P aortic aneurysm repair   5. S/P AVR    PLAN:    In order of problems listed above:  1. Coronary artery disease: Secondary prevention stressed with the patient.  Importance of compliance with diet medication stressed and he vocalized understanding.  He was urged to walk at least half an hour a day on a daily basis and he promises to do so. 2. Essential hypertension: Blood pressure is stable 3. Mixed dyslipidemia: Diet was emphasized and he will be back in the next few days for complete blood work including fasting lipids 4. Will static well aneurysm and aortic aneurysm repair: Stable recent echo reviewed with the patient and patient was advised to take a coated 81 mg of aspirin on a daily basis. 5. Former cigarette smoker: He has quit completely in he is happy about it. 6. Patient will be seen in follow-up appointment in 6 months or earlier if the patient has any concerns    Medication Adjustments/Labs and Tests Ordered: Current medicines are reviewed at length with the patient today.  Concerns regarding medicines are outlined above.  No orders of the defined types were placed in this encounter.  No orders of the defined types were placed in this encounter.    No chief complaint on file.    History of Present Illness:    Tyler Kemp is a 59 y.o. male.  Patient has past medical history of coronary artery disease post aortic valve replacement and aortic aneurysm repair.  He is a former smoker but has quit.  He has history of mixed dyslipidemia.  He denies any problems at this time  and takes care of activities of daily living.  No chest pain orthopnea or PND.  At the time of my evaluation, the patient is alert awake oriented and in no distress.  Past Medical History:  Diagnosis Date  . Choledocholithiasis 12/30/2018  . Coronary artery disease   . Deep vein thrombosis (DVT) of right upper extremity ([place]) 10/28/2018  . Food impaction of esophagus 12/11/2017  . Former cigarette smoker 06/15/2016  . GERD (gastroesophageal reflux disease)   . Hiatal hernia   . Hyponatremia 12/30/2018  . Mixed dyslipidemia 04/10/2018  . Pneumonia   . PONV (postoperative nausea and vomiting)   . S/P aortic aneurysm repair 03/13/2018  . S/P AVR 02/19/2018  . Severe aortic stenosis   . Thoracic aortic aneurysm [facility])     Past Surgical History:  Procedure Laterality Date  . BENTALL PROCEDURE N/A 02/19/2018   Procedure: BENTALL PROCEDURE;  Surgeon: Gaye Pollack, MD;  Location: [facility] OR;  Service: Open Heart Surgery;  Laterality: N/A;  RIGHT AXILLARY CANNULATION  CIRC ARREST  BILATERAL RADIAL ARTERIAL LINES  . CHOLECYSTECTOMY    . CORONARY ARTERY BYPASS GRAFT N/A 02/19/2018   Procedure: CORONARY ARTERY BYPASS GRAFTING (CABG)x3. LIMA TO LAD. SVG TO PL. SVG to OM. SAPHENOUS VEIN HARVEST.;  Surgeon: Gaye Pollack, MD;  [facility];  Service: Open Heart Surgery;  Laterality: N/A;  . ESOPHAGOGASTRODUODENOSCOPY (EGD) WITH PROPOFOL N/A 12/11/2017   Procedure:  ESOPHAGOGASTRODUODENOSCOPY (EGD) WITH PROPOFOL;  Surgeon: Iva Boop, MD;  Location[facility] ENDOSCOPY;  Service: Endoscopy;  Laterality: N/A;  . FOREIGN BODY REMOVAL N/A 12/11/2017   Procedure: FOREIGN BODY REMOVAL;  Surgeon: Iva Boop, MD;  Location: [facility] ENDOSCOPY;  Service: Endoscopy;  Laterality: N/A;  . MULTIPLE TOOTH EXTRACTIONS    . REPLACEMENT ASCENDING AORTA N/A 02/19/2018   Procedure: REPLACEMENT ASCENDING AORTA;  Surgeon: Alleen Borne, MD;  Location: [facility] OR;  Service: Open Heart Surgery;  Laterality: N/A;  . RIGHT/LEFT HEART CATH  AND CORONARY ANGIOGRAPHY N/A 12/04/2017   Procedure: RIGHT/LEFT HEART CATH AND CORONARY ANGIOGRAPHY;  Surgeon: Lyn Records, MD;  [facility] INVASIVE CV LAB;  Service: Cardiovascular;  Laterality: N/A;  . TEE WITHOUT CARDIOVERSION N/A 02/19/2018   Procedure: TRANSESOPHAGEAL ECHOCARDIOGRAM (TEE);  Surgeon: Alleen Borne, MD;  Location: [facility] OR;  Service: Open Heart Surgery;  Laterality: N/A;    Current Medications: Current Meds  Medication Sig  . aspirin 325 MG tablet Take 325 mg by mouth daily.  Marland Kitchen atorvastatin (LIPITOR) 40 MG tablet TAKE 1 TABLET (40 MG TOTAL) BY MOUTH DAILY AT 6 PM.  . gabapentin (NEURONTIN) 100 MG capsule Take 1 capsule (100 mg total) by mouth 3 (three) times daily.  . metoprolol tartrate (LOPRESSOR) 25 MG tablet TAKE 1 TABLET BY MOUTH TWICE A DAY     Allergies:   Patient has no known allergies.   Social History   Socioeconomic History  . Marital status: Single    Spouse name: Not on file  . Number of children: Not on file  . Years of education: Not on file  . Highest education level: Not on file  Occupational History  . Not on file  Tobacco Use  . Smoking status: Former Smoker    Types: Cigarettes    Quit date: 09/14/2017    Years since quitting: 2.2  . Smokeless tobacco: Never Used  Substance and Sexual Activity  . Alcohol use: Yes    Comment: occasional on weekends  . Drug use: Never  . Sexual activity: Not on file  Other Topics Concern  . Not on file  Social History Narrative  . Not on file   Social Determinants of Health   Financial Resource Strain:   . Difficulty of Paying Living Expenses:   Food Insecurity:   . Worried About Programme researcher, broadcasting/film/video in the Last Year:   . Barista in the Last Year:   Transportation Needs:   . Freight forwarder (Medical):   Marland Kitchen Lack of Transportation (Non-Medical):   Physical Activity:   . Days of Exercise per Week:   . Minutes of Exercise per Session:   Stress:   . Feeling of Stress :   Social  Connections:   . Frequency of Communication with Friends and Family:   . Frequency of Social Gatherings with Friends and Family:   . Attends Religious Services:   . Active Member of Clubs or Organizations:   . Attends Banker Meetings:   Marland Kitchen Marital Status:      Family History: The patient's family history includes Diabetes in his mother; Throat cancer in his father.  ROS:   Please see the history of present illness.    All other systems reviewed and are negative.  EKGs/Labs/Other Studies Reviewed:    The following studies were reviewed today: IMPRESSION: Changes consistent with aortic valve replacement as well as supracoronary aortic graft placement. No complicating factors are noted.  No evidence of pulmonary emboli.  Scattered nodules within both lungs stable from the previous exams and consistent with a benign etiology.  Hiatal hernia.  Aortic Atherosclerosis (ICD10-I70.0).   Electronically Signed   By: Alcide Clever M.D.   On: 07/02/2019 13:42  IMPRESSIONS    1. Left ventricular ejection fraction, by visual estimation, is 60 to  65%. The left ventricle has normal function. Normal left ventricular size.  There is moderately increased left ventricular hypertrophy.  2. Global right ventricle has normal systolic function.The right  ventricular size is normal. No increase in right ventricular wall  thickness.  3. Left atrial size was moderately dilated.  4. Right atrial size was normal.  5. Moderate thickening of the anterior mitral valve leaflet(s).  6. The mitral valve is normal in structure. No evidence of mitral valve  regurgitation. No evidence of mitral stenosis.  7. The tricuspid valve is normal in structure. Tricuspid valve  regurgitation was not visualized by color flow Dopple  8.  9. Aorta is post repair.  10. Bioprosthetic aortiv valve with satisfactory function.     Recent Labs: 05/30/2019: ALT 20; BUN 14; Creatinine,  Ser 0.85; Hemoglobin 13.8; Platelets 160; Potassium 4.4; Sodium 140; TSH 1.260  Recent Lipid Panel    Component Value Date/Time   CHOL 132 05/30/2019 0810   TRIG 146 05/30/2019 0810   HDL 54 05/30/2019 0810   CHOLHDL 2.4 05/30/2019 0810   LDLCALC 53 05/30/2019 0810    Physical Exam:    VS:  BP 128/78   Pulse 69   Temp 98.5 F (36.9 C)   Ht 6' (1.829 m)   Wt 223 lb 9.6 oz (101.4 kg)   SpO2 96%   BMI 30.33 kg/m     Wt Readings from Last 3 Encounters:  12/05/19 223 lb 9.6 oz (101.4 kg)  07/02/19 227 lb 12.8 oz (103.3 kg)  04/30/19 224 lb (101.6 kg)     GEN: Patient is in no acute distress HEENT: Normal NECK: No JVD; No carotid bruits LYMPHATICS: No lymphadenopathy CARDIAC: Hear sounds regular, 2/6 systolic murmur at the apex. RESPIRATORY:  Clear to auscultation without rales, wheezing or rhonchi  ABDOMEN: Soft, non-tender, non-distended MUSCULOSKELETAL:  No edema; No deformity  SKIN: Warm and dry NEUROLOGIC:  Alert and oriented x 3 PSYCHIATRIC:  Normal affect   Signed, Garwin Brothers, MD  12/05/2019 9:22 AM     [facility]

## 2019-12-05 NOTE — Patient Instructions (Signed)
Medication Instructions:  Your physician has recommended you make the following change in your medication:  Start 81 mg aspirin daily  *If you need a refill on your cardiac medications before your next appointment, please call your pharmacy*   Lab Work: Your physician recommends that you return for lab work in: the next few days.  You need to have labs done when you are fasting.  You can come Monday through Friday 8:30 am to 12:00 pm and 1:15 to 4:30. You do not need to make an appointment as the order has already been placed. The labs you are going to have done are BMET, CBC, TSH, LFT and Lipids.   If you have labs (blood work) drawn today and your tests are completely normal, you will receive your results only by: Marland Kitchen MyChart Message (if you have MyChart) OR . A paper copy in the mail If you have any lab test that is abnormal or we need to change your treatment, we will call you to review the results.   Testing/Procedures: None ordered    Follow-Up: At [facility], you and your health needs are our priority.  As part of our continuing mission to provide you with exceptional heart care, we have created designated Provider Care Teams.  These Care Teams include your primary Cardiologist (physician) and Advanced Practice Providers (APPs -  Physician Assistants and Nurse Practitioners) who all work together to provide you with the care you need, when you need it.  We recommend signing up for the patient portal called "MyChart".  Sign up information is provided on this After Visit Summary.  MyChart is used to connect with patients for Virtual Visits (Telemedicine).  Patients are able to view lab/test results, encounter notes, upcoming appointments, etc.  Non-urgent messages can be sent to your provider as well.   To learn more about what you can do with MyChart, go to [email or web address].    Your next appointment:   6 month(s)  The format for your next appointment:   In  Person  Provider:   Belva Crome, MD   Other Instructions NA

## 2019-12-12 DIAGNOSIS — E782 Mixed hyperlipidemia: Secondary | ICD-10-CM | POA: Diagnosis not present

## 2019-12-12 DIAGNOSIS — I2510 Atherosclerotic heart disease of native coronary artery without angina pectoris: Secondary | ICD-10-CM | POA: Diagnosis not present

## 2019-12-13 LAB — BASIC METABOLIC PANEL
BUN/Creatinine Ratio: 15 (ref 10–24)
BUN: 13 mg/dL (ref 8–27)
CO2: 22 mmol/L (ref 20–29)
Calcium: 8.9 mg/dL (ref 8.6–10.2)
Chloride: 102 mmol/L (ref 96–106)
Creatinine, Ser: 0.88 mg/dL (ref 0.76–1.27)
GFR calc Af Amer: 108 mL/min/{1.73_m2} (ref >59)
GFR calc non Af Amer: 93 mL/min/{1.73_m2} (ref >59)
Glucose: 86 mg/dL (ref 65–99)
Potassium: 4.2 mmol/L (ref 3.5–5.2)
Sodium: 139 mmol/L (ref 134–144)

## 2019-12-13 LAB — CBC WITH DIFFERENTIAL/PLATELET
Basophils Absolute: 0.1 10*3/uL (ref 0.0–0.2)
Basos: 1 %
EOS (ABSOLUTE): 0.5 10*3/uL — ABNORMAL HIGH (ref 0.0–0.4)
Eos: 7 %
Hematocrit: 42 % (ref 37.5–51.0)
Hemoglobin: 14.3 g/dL (ref 13.0–17.7)
Immature Grans (Abs): 0 10*3/uL (ref 0.0–0.1)
Immature Granulocytes: 0 %
Lymphocytes Absolute: 2 10*3/uL (ref 0.7–3.1)
Lymphs: 30 %
MCH: 29.7 pg (ref 26.6–33.0)
MCHC: 34 g/dL (ref 31.5–35.7)
MCV: 87 fL (ref 79–97)
Monocytes Absolute: 0.5 10*3/uL (ref 0.1–0.9)
Monocytes: 8 %
Neutrophils Absolute: 3.7 10*3/uL (ref 1.4–7.0)
Neutrophils: 54 %
Platelets: 160 10*3/uL (ref 150–450)
RBC: 4.82 x10E6/uL (ref 4.14–5.80)
RDW: 12.8 % (ref 11.6–15.4)
WBC: 6.8 10*3/uL (ref 3.4–10.8)

## 2019-12-13 LAB — HEPATIC FUNCTION PANEL
ALT: 22 IU/L (ref 0–44)
AST: 26 IU/L (ref 0–40)
Albumin: 4.1 g/dL (ref 3.8–4.9)
Alkaline Phosphatase: 69 IU/L (ref 39–117)
Bilirubin Total: 0.5 mg/dL (ref 0.0–1.2)
Bilirubin, Direct: 0.15 mg/dL (ref 0.00–0.40)
Total Protein: 6.2 g/dL (ref 6.0–8.5)

## 2019-12-13 LAB — LIPID PANEL
Chol/HDL Ratio: 2.5 ratio (ref 0.0–5.0)
Cholesterol, Total: 141 mg/dL (ref 100–199)
HDL: 56 mg/dL (ref >39)
LDL Chol Calc (NIH): 67 mg/dL (ref 0–99)
Triglycerides: 94 mg/dL (ref 0–149)
VLDL Cholesterol Cal: 18 mg/dL (ref 5–40)

## 2019-12-13 LAB — TSH: TSH: 1.34 u[IU]/mL (ref 0.450–4.500)

## 2019-12-17 ENCOUNTER — Encounter: Payer: Self-pay | Admitting: *Deleted

## 2020-03-02 DIAGNOSIS — L209 Atopic dermatitis, unspecified: Secondary | ICD-10-CM | POA: Diagnosis not present

## 2020-03-18 DIAGNOSIS — I8311 Varicose veins of right lower extremity with inflammation: Secondary | ICD-10-CM | POA: Diagnosis not present

## 2020-03-18 DIAGNOSIS — I83891 Varicose veins of right lower extremities with other complications: Secondary | ICD-10-CM | POA: Diagnosis not present

## 2020-03-18 DIAGNOSIS — I83811 Varicose veins of right lower extremities with pain: Secondary | ICD-10-CM | POA: Diagnosis not present

## 2020-04-25 ENCOUNTER — Other Ambulatory Visit: Payer: Self-pay | Admitting: Cardiology

## 2020-05-08 DIAGNOSIS — Z23 Encounter for immunization: Secondary | ICD-10-CM | POA: Diagnosis not present

## 2020-05-27 ENCOUNTER — Other Ambulatory Visit: Payer: Self-pay | Admitting: Surgery

## 2020-05-27 DIAGNOSIS — I712 Thoracic aortic aneurysm, without rupture, unspecified: Secondary | ICD-10-CM

## 2020-06-02 DIAGNOSIS — I83811 Varicose veins of right lower extremities with pain: Secondary | ICD-10-CM | POA: Diagnosis not present

## 2020-06-02 DIAGNOSIS — L490 Exfoliation due to erythematous condition involving less than 10 percent of body surface: Secondary | ICD-10-CM | POA: Diagnosis not present

## 2020-06-02 DIAGNOSIS — I872 Venous insufficiency (chronic) (peripheral): Secondary | ICD-10-CM | POA: Diagnosis not present

## 2020-06-08 DIAGNOSIS — I83813 Varicose veins of bilateral lower extremities with pain: Secondary | ICD-10-CM | POA: Diagnosis not present

## 2020-06-09 DIAGNOSIS — K449 Diaphragmatic hernia without obstruction or gangrene: Secondary | ICD-10-CM | POA: Insufficient documentation

## 2020-06-09 DIAGNOSIS — I712 Thoracic aortic aneurysm, without rupture, unspecified: Secondary | ICD-10-CM | POA: Insufficient documentation

## 2020-06-09 DIAGNOSIS — I350 Nonrheumatic aortic (valve) stenosis: Secondary | ICD-10-CM | POA: Insufficient documentation

## 2020-06-09 DIAGNOSIS — R112 Nausea with vomiting, unspecified: Secondary | ICD-10-CM | POA: Insufficient documentation

## 2020-06-09 DIAGNOSIS — Z9889 Other specified postprocedural states: Secondary | ICD-10-CM | POA: Insufficient documentation

## 2020-06-09 DIAGNOSIS — J189 Pneumonia, unspecified organism: Secondary | ICD-10-CM | POA: Insufficient documentation

## 2020-06-11 ENCOUNTER — Ambulatory Visit (INDEPENDENT_AMBULATORY_CARE_PROVIDER_SITE_OTHER): Payer: BC Managed Care – PPO | Admitting: Cardiology

## 2020-06-11 ENCOUNTER — Encounter: Payer: Self-pay | Admitting: Cardiology

## 2020-06-11 ENCOUNTER — Other Ambulatory Visit: Payer: Self-pay

## 2020-06-11 VITALS — BP 152/82 | HR 79 | Ht 66.0 in | Wt 228.2 lb

## 2020-06-11 DIAGNOSIS — Z9889 Other specified postprocedural states: Secondary | ICD-10-CM

## 2020-06-11 DIAGNOSIS — Z87891 Personal history of nicotine dependence: Secondary | ICD-10-CM

## 2020-06-11 DIAGNOSIS — Z8679 Personal history of other diseases of the circulatory system: Secondary | ICD-10-CM

## 2020-06-11 DIAGNOSIS — Z952 Presence of prosthetic heart valve: Secondary | ICD-10-CM | POA: Diagnosis not present

## 2020-06-11 DIAGNOSIS — E782 Mixed hyperlipidemia: Secondary | ICD-10-CM

## 2020-06-11 DIAGNOSIS — I2510 Atherosclerotic heart disease of native coronary artery without angina pectoris: Secondary | ICD-10-CM

## 2020-06-11 LAB — CBC WITH DIFFERENTIAL/PLATELET
Basophils Absolute: 0.1 10*3/uL (ref 0.0–0.2)
Basos: 1 %
EOS (ABSOLUTE): 0.2 10*3/uL (ref 0.0–0.4)
Eos: 2 %
Hematocrit: 42.2 % (ref 37.5–51.0)
Hemoglobin: 14.1 g/dL (ref 13.0–17.7)
Immature Grans (Abs): 0 10*3/uL (ref 0.0–0.1)
Immature Granulocytes: 0 %
Lymphocytes Absolute: 1.2 10*3/uL (ref 0.7–3.1)
Lymphs: 11 %
MCH: 29.6 pg (ref 26.6–33.0)
MCHC: 33.4 g/dL (ref 31.5–35.7)
MCV: 89 fL (ref 79–97)
Monocytes Absolute: 0.6 10*3/uL (ref 0.1–0.9)
Monocytes: 5 %
Neutrophils Absolute: 8.8 10*3/uL — ABNORMAL HIGH (ref 1.4–7.0)
Neutrophils: 81 %
Platelets: 160 10*3/uL (ref 150–450)
RBC: 4.77 x10E6/uL (ref 4.14–5.80)
RDW: 12.6 % (ref 11.6–15.4)
WBC: 10.8 10*3/uL (ref 3.4–10.8)

## 2020-06-11 LAB — BASIC METABOLIC PANEL
BUN/Creatinine Ratio: 16 (ref 10–24)
BUN: 13 mg/dL (ref 8–27)
CO2: 24 mmol/L (ref 20–29)
Calcium: 9.2 mg/dL (ref 8.6–10.2)
Chloride: 100 mmol/L (ref 96–106)
Creatinine, Ser: 0.8 mg/dL (ref 0.76–1.27)
GFR calc Af Amer: 111 mL/min/{1.73_m2} (ref >59)
GFR calc non Af Amer: 96 mL/min/{1.73_m2} (ref >59)
Glucose: 89 mg/dL (ref 65–99)
Potassium: 4.5 mmol/L (ref 3.5–5.2)
Sodium: 135 mmol/L (ref 134–144)

## 2020-06-11 LAB — HEPATIC FUNCTION PANEL
ALT: 19 IU/L (ref 0–44)
AST: 21 IU/L (ref 0–40)
Albumin: 4.4 g/dL (ref 3.8–4.8)
Alkaline Phosphatase: 73 IU/L (ref 44–121)
Bilirubin Total: 0.6 mg/dL (ref 0.0–1.2)
Bilirubin, Direct: 0.19 mg/dL (ref 0.00–0.40)
Total Protein: 6.6 g/dL (ref 6.0–8.5)

## 2020-06-11 LAB — LIPID PANEL
Chol/HDL Ratio: 2.3 ratio (ref 0.0–5.0)
Cholesterol, Total: 140 mg/dL (ref 100–199)
HDL: 60 mg/dL (ref >39)
LDL Chol Calc (NIH): 66 mg/dL (ref 0–99)
Triglycerides: 67 mg/dL (ref 0–149)
VLDL Cholesterol Cal: 14 mg/dL (ref 5–40)

## 2020-06-11 LAB — TSH: TSH: 1.01 u[IU]/mL (ref 0.450–4.500)

## 2020-06-11 NOTE — Patient Instructions (Signed)
Medication Instructions:  No medication changes. *If you need a refill on your cardiac medications before your next appointment, please call your pharmacy*   Lab Work: Your physician recommends that you have labs done in the office today. Your test included  basic metabolic panel, complete blood count, TSH, liver function and lipids.  If you have labs (blood work) drawn today and your tests are completely normal, you will receive your results only by: . MyChart Message (if you have MyChart) OR . A paper copy in the mail If you have any lab test that is abnormal or we need to change your treatment, we will call you to review the results.   Testing/Procedures: Your physician has requested that you have an echocardiogram. Echocardiography is a painless test that uses sound waves to create images of your heart. It provides your doctor with information about the size and shape of your heart and how well your heart's chambers and valves are working. This procedure takes approximately one hour. There are no restrictions for this procedure.     Follow-Up: At [facility], you and your health needs are our priority.  As part of our continuing mission to provide you with exceptional heart care, we have created designated Provider Care Teams.  These Care Teams include your primary Cardiologist (physician) and Advanced Practice Providers (APPs -  Physician Assistants and Nurse Practitioners) who all work together to provide you with the care you need, when you need it.  We recommend signing up for the patient portal called "MyChart".  Sign up information is provided on this After Visit Summary.  MyChart is used to connect with patients for Virtual Visits (Telemedicine).  Patients are able to view lab/test results, encounter notes, upcoming appointments, etc.  Non-urgent messages can be sent to your provider as well.   To learn more about what you can do with MyChart, go to [email or web address].     Your next appointment:   6 month(s)  The format for your next appointment:   In Person  Provider:   Rajan Revankar, MD   Other Instructions  Echocardiogram An echocardiogram is a procedure that uses painless sound waves (ultrasound) to produce an image of the heart. Images from an echocardiogram can provide important information about:  Signs of coronary artery disease (CAD).  Aneurysm detection. An aneurysm is a weak or damaged part of an artery wall that bulges out from the normal force of blood pumping through the body.  Heart size and shape. Changes in the size or shape of the heart can be associated with certain conditions, including heart failure, aneurysm, and CAD.  Heart muscle function.  Heart valve function.  Signs of a past heart attack.  Fluid buildup around the heart.  Thickening of the heart muscle.  A tumor or infectious growth around the heart valves. Tell a health care provider about:  Any allergies you have.  All medicines you are taking, including vitamins, herbs, eye drops, creams, and over-the-counter medicines.  Any blood disorders you have.  Any surgeries you have had.  Any medical conditions you have.  Whether you are pregnant or may be pregnant. What are the risks? Generally, this is a safe procedure. However, problems may occur, including:  Allergic reaction to dye (contrast) that may be used during the procedure. What happens before the procedure? No specific preparation is needed. You may eat and drink normally. What happens during the procedure?   An IV tube may be inserted into one of your   veins.  You may receive contrast through this tube. A contrast is an injection that improves the quality of the pictures from your heart.  A gel will be applied to your chest.  A wand-like tool (transducer) will be moved over your chest. The gel will help to transmit the sound waves from the transducer.  The sound waves will harmlessly  bounce off of your heart to allow the heart images to be captured in real-time motion. The images will be recorded on a computer. The procedure may vary among health care providers and hospitals. What happens after the procedure?  You may return to your normal, everyday life, including diet, activities, and medicines, unless your health care provider tells you not to do that. Summary  An echocardiogram is a procedure that uses painless sound waves (ultrasound) to produce an image of the heart.  Images from an echocardiogram can provide important information about the size and shape of your heart, heart muscle function, heart valve function, and fluid buildup around your heart.  You do not need to do anything to prepare before this procedure. You may eat and drink normally.  After the echocardiogram is completed, you may return to your normal, everyday life, unless your health care provider tells you not to do that. This information is not intended to replace advice given to you by your health care provider. Make sure you discuss any questions you have with your health care provider. Document Revised: 11/21/2018 Document Reviewed: 09/02/2016 Elsevier Patient Education  2020 Elsevier Inc.   

## 2020-06-11 NOTE — Progress Notes (Signed)
Cardiology Office Note:    Date:  06/11/2020   ID:  Tyler Kemp, DOB [birth year], MRN [id number]  PCP:  Practice, [facility]  Cardiologist:  Garwin Brothers, MD   Referring MD: Practice, Duanne Limerick*    ASSESSMENT:    1. Coronary artery disease involving native coronary artery of native heart without angina pectoris   2. Former cigarette smoker   3. Mixed dyslipidemia   4. S/P AVR   5. S/P aortic aneurysm repair    PLAN:    In order of problems listed above:  1. Coronary artery disease: Secondary prevention stressed with the patient.  Importance of compliance with diet medication stressed and he vocalized understanding.  Patient tells me that he is going to start walking half an hour on a daily basis. 2. Post aortic root repair and aortic valve replacement: He appears to be clinically stable.  Echocardiogram will be done to assess aortic valve replacement and the prosthetic valve. 3. Essential hypertension: He tells me that blood pressure stable at home.  Diet exercise and lifestyle modification was stressed please report up of his blood pressures and send them in a week 4. Mixed dyslipidemia and obesity: Diet was emphasized.  Weight reduction was stressed extensively.  Risks of obesity explained he plans to do better. 5. Patient will be seen in follow-up appointment in 6 months or earlier if the patient has any concerns    Medication Adjustments/Labs and Tests Ordered: Current medicines are reviewed at length with the patient today.  Concerns regarding medicines are outlined above.  No orders of the defined types were placed in this encounter.  No orders of the defined types were placed in this encounter.    No chief complaint on file.    History of Present Illness:    Tyler Kemp is a 59 y.o. male.  Patient has past medical history of coronary artery disease post aortic aneurysm surgery and aortic valve replacement.  He denies any problems at this  time and takes care of activities of daily living.  No chest pain orthopnea or PND.  He exercises not much and leads a sedentary lifestyle.  No chest pain.  He tells me that he lax with diet and exercise.  Past Medical History:  Diagnosis Date  . Choledocholithiasis 12/30/2018  . Coronary artery disease   . Deep vein thrombosis (DVT) of right upper extremity ([facility]) 10/28/2018  . Food impaction of esophagus 12/11/2017  . Former cigarette smoker 06/15/2016  . GERD (gastroesophageal reflux disease)   . Hiatal hernia   . Hyponatremia 12/30/2018  . Mixed dyslipidemia 04/10/2018  . Pneumonia   . PONV (postoperative nausea and vomiting)   . S/P aortic aneurysm repair 03/13/2018  . S/P AVR 02/19/2018  . Severe aortic stenosis   . Thoracic aortic aneurysm [facility])     Past Surgical History:  Procedure Laterality Date  . BENTALL PROCEDURE N/A 02/19/2018   Procedure: BENTALL PROCEDURE;  Surgeon: Alleen Borne, MD;  Location: [facility] OR;  Service: Open Heart Surgery;  Laterality: N/A;  RIGHT AXILLARY CANNULATION  CIRC ARREST  BILATERAL RADIAL ARTERIAL LINES  . CHOLECYSTECTOMY    . CORONARY ARTERY BYPASS GRAFT N/A 02/19/2018   Procedure: CORONARY ARTERY BYPASS GRAFTING (CABG)x3. LIMA TO LAD. SVG TO PL. SVG to OM. SAPHENOUS VEIN HARVEST.;  Surgeon: Alleen Borne, MD;  Location: [facility] OR;  Service: Open Heart Surgery;  Laterality: N/A;  . ESOPHAGOGASTRODUODENOSCOPY (EGD) WITH PROPOFOL N/A 12/11/2017   Procedure: ESOPHAGOGASTRODUODENOSCOPY (EGD) WITH  PROPOFOL;  Surgeon: Iva Boop, MD;  Location: [facility] ENDOSCOPY;  Service: Endoscopy;  Laterality: N/A;  . FOREIGN BODY REMOVAL N/A 12/11/2017   Procedure: FOREIGN BODY REMOVAL;  Surgeon: Iva Boop, MD;  Location: [facility] ENDOSCOPY;  Service: Endoscopy;  Laterality: N/A;  . MULTIPLE TOOTH EXTRACTIONS    . REPLACEMENT ASCENDING AORTA N/A 02/19/2018   Procedure: REPLACEMENT ASCENDING AORTA;  Surgeon: Alleen Borne, MD;  Location: [facility] OR;  Service: Open Heart Surgery;   Laterality: N/A;  . RIGHT/LEFT HEART CATH AND CORONARY ANGIOGRAPHY N/A 12/04/2017   Procedure: RIGHT/LEFT HEART CATH AND CORONARY ANGIOGRAPHY;  Surgeon: Lyn Records, MD;  [facility] INVASIVE CV LAB;  Service: Cardiovascular;  Laterality: N/A;  . TEE WITHOUT CARDIOVERSION N/A 02/19/2018   Procedure: TRANSESOPHAGEAL ECHOCARDIOGRAM (TEE);  Surgeon: Alleen Borne, MD;  Location: [facility] OR;  Service: Open Heart Surgery;  Laterality: N/A;    Current Medications: Current Meds  Medication Sig  . aspirin EC 81 MG tablet Take 1 tablet (81 mg total) by mouth daily.  Marland Kitchen atorvastatin (LIPITOR) 40 MG tablet TAKE 1 TABLET (40 MG TOTAL) BY MOUTH DAILY AT 6 PM.  . gabapentin (NEURONTIN) 100 MG capsule Take 1 capsule (100 mg total) by mouth 3 (three) times daily.  . metoprolol tartrate (LOPRESSOR) 25 MG tablet TAKE 1 TABLET BY MOUTH TWICE A DAY     Allergies:   Patient has no known allergies.   Social History   Socioeconomic History  . Marital status: Single    Spouse name: Not on file  . Number of children: Not on file  . Years of education: Not on file  . Highest education level: Not on file  Occupational History  . Not on file  Tobacco Use  . Smoking status: Former Smoker    Types: Cigarettes    Quit date: 09/14/2017    Years since quitting: 2.7  . Smokeless tobacco: Never Used  Vaping Use  . Vaping Use: Never used  Substance and Sexual Activity  . Alcohol use: Yes    Comment: occasional on weekends  . Drug use: Never  . Sexual activity: Not on file  Other Topics Concern  . Not on file  Social History Narrative  . Not on file   Social Determinants of Health   Financial Resource Strain:   . Difficulty of Paying Living Expenses: Not on file  Food Insecurity:   . Worried About Programme researcher, broadcasting/film/video in the Last Year: Not on file  . Ran Out of Food in the Last Year: Not on file  Transportation Needs:   . Lack of Transportation (Medical): Not on file  . Lack of Transportation  (Non-Medical): Not on file  Physical Activity:   . Days of Exercise per Week: Not on file  . Minutes of Exercise per Session: Not on file  Stress:   . Feeling of Stress : Not on file  Social Connections:   . Frequency of Communication with Friends and Family: Not on file  . Frequency of Social Gatherings with Friends and Family: Not on file  . Attends Religious Services: Not on file  . Active Member of Clubs or Organizations: Not on file  . Attends Banker Meetings: Not on file  . Marital Status: Not on file     Family History: The patient's family history includes Diabetes in his mother; Throat cancer in his father.  ROS:   Please see the history of present illness.    All  other systems reviewed and are negative.  EKGs/Labs/Other Studies Reviewed:    The following studies were reviewed today: I discussed my findings with the patient including labs from last visit.  EKG reveals sinus rhythm, LVH and T wave inversions in inferior leads.     Recent Labs: 12/12/2019: ALT 22; BUN 13; Creatinine, Ser 0.88; Hemoglobin 14.3; Platelets 160; Potassium 4.2; Sodium 139; TSH 1.340  Recent Lipid Panel    Component Value Date/Time   CHOL 141 12/12/2019 0809   TRIG 94 12/12/2019 0809   HDL 56 12/12/2019 0809   CHOLHDL 2.5 12/12/2019 0809   LDLCALC 67 12/12/2019 0809    Physical Exam:    VS:  BP (!) 152/82   Pulse 79   Ht 5\' 6"  (1.676 m)   Wt 228 lb 3.2 oz (103.5 kg)   SpO2 99%   BMI 36.83 kg/m     Wt Readings from Last 3 Encounters:  06/11/20 228 lb 3.2 oz (103.5 kg)  12/05/19 223 lb 9.6 oz (101.4 kg)  07/02/19 227 lb 12.8 oz (103.3 kg)     GEN: Patient is in no acute distress HEENT: Normal NECK: No JVD; No carotid bruits LYMPHATICS: No lymphadenopathy CARDIAC: Hear sounds regular, 2/6 systolic murmur at the apex. RESPIRATORY:  Clear to auscultation without rales, wheezing or rhonchi  ABDOMEN: Soft, non-tender, non-distended MUSCULOSKELETAL:  No edema; No  deformity  SKIN: Warm and dry NEUROLOGIC:  Alert and oriented x 3 PSYCHIATRIC:  Normal affect   Signed, 07/04/19, MD  06/11/2020 8:39 AM    [facility]

## 2020-06-28 ENCOUNTER — Other Ambulatory Visit: Payer: Medicaid Other

## 2020-07-14 ENCOUNTER — Other Ambulatory Visit: Payer: Self-pay

## 2020-07-14 ENCOUNTER — Ambulatory Visit
Admission: RE | Admit: 2020-07-14 | Discharge: 2020-07-14 | Disposition: A | Discharge status: Home / Self Care | Payer: BC Managed Care – PPO | Source: Ambulatory Visit | Attending: Surgery | Admitting: Surgery

## 2020-07-14 ENCOUNTER — Ambulatory Visit (INDEPENDENT_AMBULATORY_CARE_PROVIDER_SITE_OTHER): Payer: 59 | Admitting: Surgery

## 2020-07-14 ENCOUNTER — Encounter: Payer: Self-pay | Admitting: Surgery

## 2020-07-14 VITALS — BP 130/78 | HR 90 | Resp 20 | Ht 66.0 in | Wt 223.0 lb

## 2020-07-14 DIAGNOSIS — I712 Thoracic aortic aneurysm, without rupture, unspecified: Secondary | ICD-10-CM

## 2020-07-14 MED ORDER — IOPAMIDOL (ISOVUE-370) INJECTION 76%
75.0000 mL | Freq: Once | INTRAVENOUS | Status: AC | PRN
  Administered 2020-07-14: 75 mL via INTRAVENOUS

## 2020-07-14 NOTE — Progress Notes (Signed)
HPI:  The patient returns for follow-up status post placement of an ascending aortic aneurysm under deep hypothermic circulatory arrest using right axillary artery cannulation, aortic valve replacement with a 25 mmINSPIRIS RESILIA aortic valve,Wheat procedure (supra-coronary aortic anastomosis)and coronary bypass graft surgery x3 02/19/2018.   Since I last saw him on 07/02/2019 he has been feeling well.  He denies any chest pain or shortness of breath.  He still has some signs of autonomic dysfunction in the right upper extremity with his right hand sometimes becoming cool, white, numb and then at other times flushed and purplish red with sweating of his palm.  He feels like his strength in the right upper extremity is back to normal.  Current Outpatient Medications  Medication Sig Dispense Refill  . aspirin EC 81 MG tablet Take 1 tablet (81 mg total) by mouth daily. 90 tablet 3  . atorvastatin (LIPITOR) 40 MG tablet TAKE 1 TABLET (40 MG TOTAL) BY MOUTH DAILY AT 6 PM. 90 tablet 1  . gabapentin (NEURONTIN) 100 MG capsule Take 1 capsule (100 mg total) by mouth 3 (three) times daily. 90 capsule 6  . metoprolol tartrate (LOPRESSOR) 25 MG tablet TAKE 1 TABLET BY MOUTH TWICE A DAY 180 tablet 1   No current facility-administered medications for this visit.     Physical Exam: BP 130/78   Pulse 90   Resp 20   Ht 5\' 6"  (1.676 m)   Wt 223 lb (101.2 kg)   SpO2 97% Comment: RA  BMI 35.99 kg/m  He looks well. Cardiac exam shows regular rate and rhythm with normal heart sounds.  There is no murmur. Lungs are clear. The chest incision is well-healed and the sternum is stable. He is right-hand is pink and warm with a strong radial pulse.  Right grip strength is normal.   Diagnostic Tests:  CLINICAL DATA:  59 year old male with a history of thoracic aortic aneurysm  EXAM: CT ANGIOGRAPHY CHEST WITH CONTRAST  TECHNIQUE: Multidetector CT imaging of the chest was performed using  the standard protocol during bolus administration of intravenous contrast. Multiplanar CT image reconstructions and MIPs were obtained to evaluate the vascular anatomy.  CONTRAST:  64mL ISOVUE-370 IOPAMIDOL (ISOVUE-370) INJECTION 76%  COMPARISON:  07/02/2019, 12/11/2017  FINDINGS: Cardiovascular:  Heart:  Surgical changes of median sternotomy, CABG, with native coronary artery calcifications. Heart size is unchanged with no pericardial fluid/thickening.  Aorta:  Surgical changes aortic valve replacement and supra coronary ascending aorta grafting/hemi arch repair of ascending aneurysm. Unremarkable appearance of the graft anastomosis both proximally and distally. Mild atherosclerotic changes of the thoracic aorta again noted.  Branch arteries remain patent. Common origin of the innominate artery and the left common carotid artery again noted. Cervical cerebral vessels patent, with dominant right vertebral artery.  No periaortic fluid.  No dissection.  The greatest diameter of the distal arch/proximal descending thoracic aorta measures 33 mm on the sagittal reformatted images, unchanged from prior. Diameter of the aorta at the aortic hiatus measures 26 mm.  Pulmonary arteries:  Timing of the contrast bolus is not optimized for evaluation of pulmonary artery filling defects.  Mediastinum/Nodes: No mediastinal adenopathy. Hiatal hernia with otherwise unremarkable thoracic esophagus.  Unremarkable appearance of the thoracic inlet.  Lungs/Pleura: Mild centrilobular nodularity of the right upper lobe posteriorly with evidence of minimal endobronchial debris. Similar pattern of centrilobular nodularity in the right lower lobe. These changes are new from the comparison CT of 07/02/2019. No pleural effusion. No pneumothorax. No confluent  airspace disease.  Upper Abdomen: No acute.  Musculoskeletal: Configuration of the superior endplate of C7  is unchanged from the prior. Degenerative changes of the thoracic spine. No bony canal narrowing.  Review of the MIP images confirms the above findings.  IMPRESSION: Since the prior CT there is development of centrilobular nodularity with associated minimal endobronchial debris of the right upper lobe and right lower lobe, indicating inflammatory/infectious process. Given the patient's apparent risk factors, a short interval follow-up noncontrast chest CT may be reasonable to assure resolution.  Unremarkable appearance of postsurgical changes of median sternotomy, CABG, aortic valve replacement with supra coronary hemi arch graft repair of ascending aorta.  Hiatal hernia.  Aortic Atherosclerosis (ICD10-I70.0).  Signed,  Yvone Neu. Reyne Dumas, RPVI  [facility]  [facility]   Electronically Signed   By: Gilmer Mor D.O.   On: 07/14/2020 14:35  Impression:  Overall he continues to do well following his surgery.  He still has some autonomic dysfunction of the right upper extremity secondary to brachial plexus stretch which may continue to improve over time.  This does not cause any problem with the functioning of his right upper extremity.  CTA of the chest today shows stable appearance of the aortic root, aortic arch and descending thoracic aorta.  I reviewed the CTA images with him and answered his questions  Plan:  He has been scheduled for a follow-up echocardiogram in a couple weeks to check his aortic valve.  I will plan to see him back in 1 year with a CTA of the chest to follow-up on his residual thoracic aorta.  I spent 15 minutes performing this established patient evaluation and > 50% of this time was spent face to face counseling and coordinating the surveillance of his aorta with a history of resected ascending aortic aneurysm.   Alleen Borne, MD [facility] [phone]

## 2020-07-23 ENCOUNTER — Other Ambulatory Visit: Payer: Self-pay

## 2020-07-23 ENCOUNTER — Ambulatory Visit (INDEPENDENT_AMBULATORY_CARE_PROVIDER_SITE_OTHER): Payer: 59

## 2020-07-23 DIAGNOSIS — I2510 Atherosclerotic heart disease of native coronary artery without angina pectoris: Secondary | ICD-10-CM

## 2020-07-23 DIAGNOSIS — Z8679 Personal history of other diseases of the circulatory system: Secondary | ICD-10-CM | POA: Diagnosis not present

## 2020-07-23 DIAGNOSIS — Z9889 Other specified postprocedural states: Secondary | ICD-10-CM

## 2020-07-23 LAB — ECHOCARDIOGRAM COMPLETE
AR max vel: 1.13 cm2
AV Area VTI: 1.19 cm2
AV Area mean vel: 1.12 cm2
AV Mean grad: 11 mmHg
AV Peak grad: 19.9 mmHg
Ao pk vel: 2.23 m/s
Area-P 1/2: 3.65 cm2
Calc EF: 45.2 %
S' Lateral: 4.1 cm
Single Plane A2C EF: 44.3 %
Single Plane A4C EF: 48 %

## 2020-07-23 NOTE — Progress Notes (Signed)
Complete echocardiogram performed.  Jimmy Elizette Shek RDCS, RVT  

## 2020-07-25 ENCOUNTER — Other Ambulatory Visit: Payer: Self-pay | Admitting: Cardiology

## 2020-10-24 ENCOUNTER — Other Ambulatory Visit: Payer: Self-pay | Admitting: Cardiology

## 2020-10-25 NOTE — Telephone Encounter (Signed)
Atorvastatin and Metoprolol approved and sent

## 2020-11-12 ENCOUNTER — Encounter: Payer: Self-pay | Admitting: *Deleted

## 2020-11-15 ENCOUNTER — Ambulatory Visit (INDEPENDENT_AMBULATORY_CARE_PROVIDER_SITE_OTHER): Payer: 59 | Admitting: Cardiology

## 2020-11-15 ENCOUNTER — Encounter: Payer: Self-pay | Admitting: Cardiology

## 2020-11-15 ENCOUNTER — Other Ambulatory Visit: Payer: Self-pay

## 2020-11-15 VITALS — BP 148/98 | HR 68 | Ht 66.0 in | Wt 225.2 lb

## 2020-11-15 DIAGNOSIS — Z1329 Encounter for screening for other suspected endocrine disorder: Secondary | ICD-10-CM

## 2020-11-15 DIAGNOSIS — Z8679 Personal history of other diseases of the circulatory system: Secondary | ICD-10-CM

## 2020-11-15 DIAGNOSIS — Z9889 Other specified postprocedural states: Secondary | ICD-10-CM

## 2020-11-15 DIAGNOSIS — I2510 Atherosclerotic heart disease of native coronary artery without angina pectoris: Secondary | ICD-10-CM

## 2020-11-15 DIAGNOSIS — I712 Thoracic aortic aneurysm, without rupture, unspecified: Secondary | ICD-10-CM

## 2020-11-15 DIAGNOSIS — Z952 Presence of prosthetic heart valve: Secondary | ICD-10-CM

## 2020-11-15 DIAGNOSIS — E782 Mixed hyperlipidemia: Secondary | ICD-10-CM

## 2020-11-15 LAB — CBC WITH DIFFERENTIAL/PLATELET
Basophils Absolute: 0.1 10*3/uL (ref 0.0–0.2)
Basos: 1 %
EOS (ABSOLUTE): 0.2 10*3/uL (ref 0.0–0.4)
Eos: 4 %
Hematocrit: 42.8 % (ref 37.5–51.0)
Hemoglobin: 14.5 g/dL (ref 13.0–17.7)
Immature Grans (Abs): 0 10*3/uL (ref 0.0–0.1)
Immature Granulocytes: 0 %
Lymphocytes Absolute: 1.8 10*3/uL (ref 0.7–3.1)
Lymphs: 31 %
MCH: 29.4 pg (ref 26.6–33.0)
MCHC: 33.9 g/dL (ref 31.5–35.7)
MCV: 87 fL (ref 79–97)
Monocytes Absolute: 0.4 10*3/uL (ref 0.1–0.9)
Monocytes: 8 %
Neutrophils Absolute: 3.3 10*3/uL (ref 1.4–7.0)
Neutrophils: 56 %
Platelets: 166 10*3/uL (ref 150–450)
RBC: 4.93 x10E6/uL (ref 4.14–5.80)
RDW: 12.8 % (ref 11.6–15.4)
WBC: 5.8 10*3/uL (ref 3.4–10.8)

## 2020-11-15 LAB — HEPATIC FUNCTION PANEL
ALT: 20 IU/L (ref 0–44)
AST: 22 IU/L (ref 0–40)
Albumin: 4.3 g/dL (ref 3.8–4.8)
Alkaline Phosphatase: 70 IU/L (ref 44–121)
Bilirubin Total: 0.5 mg/dL (ref 0.0–1.2)
Bilirubin, Direct: 0.16 mg/dL (ref 0.00–0.40)
Total Protein: 6.8 g/dL (ref 6.0–8.5)

## 2020-11-15 LAB — BASIC METABOLIC PANEL
BUN/Creatinine Ratio: 16 (ref 10–24)
BUN: 13 mg/dL (ref 8–27)
CO2: 22 mmol/L (ref 20–29)
Calcium: 9.4 mg/dL (ref 8.6–10.2)
Chloride: 102 mmol/L (ref 96–106)
Creatinine, Ser: 0.83 mg/dL (ref 0.76–1.27)
Glucose: 90 mg/dL (ref 65–99)
Potassium: 4.7 mmol/L (ref 3.5–5.2)
Sodium: 138 mmol/L (ref 134–144)
eGFR: 100 mL/min/{1.73_m2} (ref >59)

## 2020-11-15 LAB — LIPID PANEL
Chol/HDL Ratio: 2.6 ratio (ref 0.0–5.0)
Cholesterol, Total: 148 mg/dL (ref 100–199)
HDL: 56 mg/dL (ref >39)
LDL Chol Calc (NIH): 74 mg/dL (ref 0–99)
Triglycerides: 97 mg/dL (ref 0–149)
VLDL Cholesterol Cal: 18 mg/dL (ref 5–40)

## 2020-11-15 LAB — TSH: TSH: 1.64 u[IU]/mL (ref 0.450–4.500)

## 2020-11-15 NOTE — Patient Instructions (Signed)

## 2020-11-15 NOTE — Progress Notes (Signed)
Cardiology Office Note:    Date:  11/15/2020   ID:  Tyler Kemp, DOB January 18, 1961, MRN [id number]  PCP:  Practice, [facility]  Cardiologist:  Garwin Brothers, MD   Referring MD: Practice, [facility]*    ASSESSMENT:    1. Coronary artery disease involving native coronary artery of native heart without angina pectoris   2. Thoracic aortic aneurysm without rupture (HCC)   3. S/P AVR   4. S/P aortic aneurysm repair   5. Mixed dyslipidemia    PLAN:    In order of problems listed above:  1. Primary prevention stressed with the patient.  Importance of compliance with diet medication stressed any vocalized understanding.  He was advised to walk at least half an hour a day 5 days a week and he promises to do so. 2. Essential hypertension: Blood pressure stable and diet was emphasized.  Lifestyle modification urged. 3. Aortic valve replacement: Stable at this time echocardiogram reviewed CT scan reviewed and it reveals that the anatomy of the repair is fine. 4.   Mixed dyslipidemia: Diet was emphasized and he will have complete blood work today he is agreeable. 5. Obesity: Weight reduction stressed and he promises to do better.  I mentioned to the patient the risks of obesity and he vocalized understanding.  He promises to diet and exercise and lose weight. 6. Patient will be seen in follow-up appointment in 6 months or earlier if the patient has any concerns    Medication Adjustments/Labs and Tests Ordered: Current medicines are reviewed at length with the patient today.  Concerns regarding medicines are outlined above.  No orders of the defined types were placed in this encounter.  No orders of the defined types were placed in this encounter.    No chief complaint on file.    History of Present Illness:    Tyler Kemp is a 60 y.o. male.  Patient has past medical history of aortic valve replacement for severe aortic stenosis, essential hypertension and  dyslipidemia.  He denies any problems at this time and takes care of activities of daily living.  No chest pain orthopnea or PND.  At the time of my evaluation, the patient is alert awake oriented and in no distress.  He ambulates on a regular basis but does not have a regular exercise activity.  He has issues with varicose veins but has received treatment and feels better.  At the time of my evaluation, the patient is alert awake oriented and in no distress.  Past Medical History:  Diagnosis Date  . Choledocholithiasis 12/30/2018  . Coronary artery disease   . Deep vein thrombosis (DVT) of right upper extremity (HCC) 10/28/2018  . Food impaction of esophagus 12/11/2017  . Former cigarette smoker 06/15/2016  . GERD (gastroesophageal reflux disease)   . Hiatal hernia   . Hyponatremia 12/30/2018  . Mixed dyslipidemia 04/10/2018  . Pneumonia   . PONV (postoperative nausea and vomiting)   . S/P aortic aneurysm repair 03/13/2018  . S/P AVR 02/19/2018  . Severe aortic stenosis   . Thoracic aortic aneurysm [facility])     Past Surgical History:  Procedure Laterality Date  . BENTALL PROCEDURE N/A 02/19/2018   Procedure: BENTALL PROCEDURE;  Surgeon: Alleen Borne, MD;  Location: [facility] OR;  Service: Open Heart Surgery;  Laterality: N/A;  RIGHT AXILLARY CANNULATION  CIRC ARREST  BILATERAL RADIAL ARTERIAL LINES  . CHOLECYSTECTOMY    . CORONARY ARTERY BYPASS GRAFT N/A 02/19/2018   Procedure: CORONARY  ARTERY BYPASS GRAFTING (CABG)x3. LIMA TO LAD. SVG TO PL. SVG to OM. SAPHENOUS VEIN HARVEST.;  Surgeon: Alleen Borne, MD;  Location: [facility] OR;  Service: Open Heart Surgery;  Laterality: N/A;  . ESOPHAGOGASTRODUODENOSCOPY (EGD) WITH PROPOFOL N/A 12/11/2017   Procedure: ESOPHAGOGASTRODUODENOSCOPY (EGD) WITH PROPOFOL;  Surgeon: Iva Boop, MD;  Location: [facility] ENDOSCOPY;  Service: Endoscopy;  Laterality: N/A;  . FOREIGN BODY REMOVAL N/A 12/11/2017   Procedure: FOREIGN BODY REMOVAL;  Surgeon: Iva Boop, MD;   Location: [facility] ENDOSCOPY;  Service: Endoscopy;  Laterality: N/A;  . MULTIPLE TOOTH EXTRACTIONS    . REPLACEMENT ASCENDING AORTA N/A 02/19/2018   Procedure: REPLACEMENT ASCENDING AORTA;  Surgeon: Alleen Borne, MD;  Location: [facility] OR;  Service: Open Heart Surgery;  Laterality: N/A;  . RIGHT/LEFT HEART CATH AND CORONARY ANGIOGRAPHY N/A 12/04/2017   Procedure: RIGHT/LEFT HEART CATH AND CORONARY ANGIOGRAPHY;  Surgeon: Lyn Records, MD;  Location: [facility] INVASIVE CV LAB;  Service: Cardiovascular;  Laterality: N/A;  . TEE WITHOUT CARDIOVERSION N/A 02/19/2018   Procedure: TRANSESOPHAGEAL ECHOCARDIOGRAM (TEE);  Surgeon: Alleen Borne, MD;  Location: [facility] OR;  Service: Open Heart Surgery;  Laterality: N/A;    Current Medications: Current Meds  Medication Sig  . aspirin EC 81 MG tablet Take 1 tablet (81 mg total) by mouth daily.  Marland Kitchen atorvastatin (LIPITOR) 40 MG tablet Take 40 mg by mouth daily.  Marland Kitchen gabapentin (NEURONTIN) 100 MG capsule Take 100 mg by mouth 3 (three) times daily.  . metoprolol tartrate (LOPRESSOR) 25 MG tablet Take 25 mg by mouth 2 (two) times daily.     Allergies:   Patient has no known allergies.   Social History   Socioeconomic History  . Marital status: Single    Spouse name: Not on file  . Number of children: Not on file  . Years of education: Not on file  . Highest education level: Not on file  Occupational History  . Not on file  Tobacco Use  . Smoking status: Former Smoker    Types: Cigarettes    Quit date: 09/14/2017    Years since quitting: 3.1  . Smokeless tobacco: Never Used  Vaping Use  . Vaping Use: Never used  Substance and Sexual Activity  . Alcohol use: Yes    Comment: occasional on weekends  . Drug use: Never  . Sexual activity: Not on file  Other Topics Concern  . Not on file  Social History Narrative  . Not on file   Social Determinants of Health   Financial Resource Strain: Not on file  Food Insecurity: Not on file  Transportation Needs: Not on file   Physical Activity: Not on file  Stress: Not on file  Social Connections: Not on file     Family History: The patient's family history includes Diabetes in his mother and sister; Heart disease in his father and sister; Stroke in his father; Throat cancer in his father.  ROS:   Please see the history of present illness.    All other systems reviewed and are negative.  EKGs/Labs/Other Studies Reviewed:    The following studies were reviewed today: IMPRESSIONS    1. Left ventricular ejection fraction, by estimation, is 55 to 60%. The  left ventricle has normal function. The left ventricle has no regional  wall motion abnormalities. There is mild left ventricular hypertrophy.  Left ventricular diastolic parameters  are consistent with Grade II diastolic dysfunction (pseudonormalization).  2. Right ventricular systolic function is normal. The right  ventricular  size is normal. There is normal pulmonary artery systolic pressure.  3. Left atrial size was moderately dilated.  4. The mitral valve is normal in structure. No evidence of mitral valve  regurgitation. No evidence of mitral stenosis.  5. Accelertion time 6ms. The aortic valve has been repaired/replaced.  Aortic valve regurgitation is not visualized. Mild aortic valve stenosis.  There is a 25 mm Inspiris Resilia valve present in the aortic position.  Aortic valve area, by VTI measures  1.19 cm. Aortic valve mean gradient measures 11.0 mmHg.  6. There is moderate dilatation of the ascending aorta, measuring 44 mm.  7. The inferior vena cava is normal in size with greater than 50%  respiratory variability, suggesting right atrial pressure of 3 mmHg.    CLINICAL DATA:  60 year old male with a history of thoracic aortic aneurysm  EXAM: CT ANGIOGRAPHY CHEST WITH CONTRAST  TECHNIQUE: Multidetector CT imaging of the chest was performed using the standard protocol during bolus administration of  intravenous contrast. Multiplanar CT image reconstructions and MIPs were obtained to evaluate the vascular anatomy.  CONTRAST:  70mL ISOVUE-370 IOPAMIDOL (ISOVUE-370) INJECTION 76%  COMPARISON:  07/02/2019, 12/11/2017  FINDINGS: Cardiovascular:  Heart:  Surgical changes of median sternotomy, CABG, with native coronary artery calcifications. Heart size is unchanged with no pericardial fluid/thickening.  Aorta:  Surgical changes aortic valve replacement and supra coronary ascending aorta grafting/hemi arch repair of ascending aneurysm. Unremarkable appearance of the graft anastomosis both proximally and distally. Mild atherosclerotic changes of the thoracic aorta again noted.  Branch arteries remain patent. Common origin of the innominate artery and the left common carotid artery again noted. Cervical cerebral vessels patent, with dominant right vertebral artery.  No periaortic fluid.  No dissection.  The greatest diameter of the distal arch/proximal descending thoracic aorta measures 33 mm on the sagittal reformatted images, unchanged from prior. Diameter of the aorta at the aortic hiatus measures 26 mm.  Pulmonary arteries:  Timing of the contrast bolus is not optimized for evaluation of pulmonary artery filling defects.  Mediastinum/Nodes: No mediastinal adenopathy. Hiatal hernia with otherwise unremarkable thoracic esophagus.  Unremarkable appearance of the thoracic inlet.  Lungs/Pleura: Mild centrilobular nodularity of the right upper lobe posteriorly with evidence of minimal endobronchial debris. Similar pattern of centrilobular nodularity in the right lower lobe. These changes are new from the comparison CT of 07/02/2019. No pleural effusion. No pneumothorax. No confluent airspace disease.  Upper Abdomen: No acute.  Musculoskeletal: Configuration of the superior endplate of C7 is unchanged from the prior. Degenerative changes of the  thoracic spine. No bony canal narrowing.  Review of the MIP images confirms the above findings.  IMPRESSION: Since the prior CT there is development of centrilobular nodularity with associated minimal endobronchial debris of the right upper lobe and right lower lobe, indicating inflammatory/infectious process. Given the patient's apparent risk factors, a short interval follow-up noncontrast chest CT may be reasonable to assure resolution.  Unremarkable appearance of postsurgical changes of median sternotomy, CABG, aortic valve replacement with supra coronary hemi arch graft repair of ascending aorta.  Hiatal hernia.  Aortic Atherosclerosis (ICD10-I70.0).  Signed,  Yvone Neu. Reyne Dumas, RPVI  [facility]  [facility]   Electronically Signed   By: Gilmer Mor D.O.   On: 07/14/2020 14:35    Recent Labs: 06/11/2020: ALT 19; BUN 13; Creatinine, Ser 0.80; Hemoglobin 14.1; Platelets 160; Potassium 4.5; Sodium 135; TSH 1.010  Recent Lipid Panel    Component Value Date/Time  CHOL 140 06/11/2020 0902   TRIG 67 06/11/2020 0902   HDL 60 06/11/2020 0902   CHOLHDL 2.3 06/11/2020 0902   LDLCALC 66 06/11/2020 0902    Physical Exam:    VS:  BP (!) 148/98   Pulse 68   Ht 5\' 6"  (1.676 m)   Wt 225 lb 3.2 oz (102.2 kg)   SpO2 98%   BMI 36.35 kg/m     Wt Readings from Last 3 Encounters:  11/15/20 225 lb 3.2 oz (102.2 kg)  07/14/20 223 lb (101.2 kg)  06/11/20 228 lb 3.2 oz (103.5 kg)     GEN: Patient is in no acute distress HEENT: Normal NECK: No JVD; No carotid bruits LYMPHATICS: No lymphadenopathy CARDIAC: Hear sounds regular, 2/6 systolic murmur at the apex. RESPIRATORY:  Clear to auscultation without rales, wheezing or rhonchi  ABDOMEN: Soft, non-tender, non-distended MUSCULOSKELETAL:  No edema; No deformity  SKIN: Warm and dry NEUROLOGIC:  Alert and oriented x 3 PSYCHIATRIC:  Normal affect    Signed, Garwin Brothersajan R Ulrick Methot, MD  11/15/2020 8:26 AM    [facility]

## 2020-11-17 ENCOUNTER — Telehealth: Payer: Self-pay

## 2020-11-17 NOTE — Telephone Encounter (Signed)
-----   Message from Garwin Brothers, MD sent at 11/16/2020 10:34 AM EDT ----- The results of the study is unremarkable. Please inform patient. I will discuss in detail at next appointment. Cc  primary care/referring physician Garwin Brothers, MD 11/16/2020 10:34 AM

## 2020-11-17 NOTE — Telephone Encounter (Signed)
Spoke with patient regarding results and recommendation.  Patient verbalizes understanding and is agreeable to plan of care. Advised patient to call back with any issues or concerns.  

## 2021-03-12 ENCOUNTER — Other Ambulatory Visit: Payer: Self-pay | Admitting: Cardiology

## 2021-03-19 ENCOUNTER — Other Ambulatory Visit: Payer: Self-pay | Admitting: Cardiology

## 2021-04-01 ENCOUNTER — Other Ambulatory Visit: Payer: Self-pay | Admitting: Cardiology

## 2021-05-06 ENCOUNTER — Other Ambulatory Visit: Payer: Self-pay | Admitting: Cardiology

## 2021-05-08 ENCOUNTER — Other Ambulatory Visit: Payer: Self-pay | Admitting: Cardiology

## 2021-05-14 ENCOUNTER — Other Ambulatory Visit: Payer: Self-pay | Admitting: Cardiology

## 2021-05-16 ENCOUNTER — Other Ambulatory Visit: Payer: Self-pay | Admitting: Cardiology

## 2021-05-17 ENCOUNTER — Telehealth: Payer: Self-pay | Admitting: Cardiology

## 2021-05-17 NOTE — Telephone Encounter (Signed)
*  STAT* If patient is at the pharmacy, call can be transferred to refill team.   1. Which medications need to be refilled? (please list name of each medication and dose if known) gabapentin (NEURONTIN) 100 MG capsule  2. Which pharmacy/location (including street and city if local pharmacy) is medication to be sent to? [id number] - [place], [place] - [address]  3. Do they need a 30 day or 90 day supply? 90 day   Patient is out of medication.

## 2021-05-17 NOTE — Telephone Encounter (Signed)
Advised this medication comes from his PCP. Tyler Kemp verbalized understanding and had no additional questions.

## 2021-05-30 ENCOUNTER — Other Ambulatory Visit: Payer: Self-pay

## 2021-06-10 ENCOUNTER — Other Ambulatory Visit: Payer: Self-pay

## 2021-06-10 ENCOUNTER — Ambulatory Visit (INDEPENDENT_AMBULATORY_CARE_PROVIDER_SITE_OTHER): Payer: 59 | Admitting: Cardiology

## 2021-06-10 ENCOUNTER — Encounter: Payer: Self-pay | Admitting: Cardiology

## 2021-06-10 VITALS — BP 162/92 | HR 70 | Ht 66.0 in | Wt 231.2 lb

## 2021-06-10 DIAGNOSIS — I2510 Atherosclerotic heart disease of native coronary artery without angina pectoris: Secondary | ICD-10-CM

## 2021-06-10 DIAGNOSIS — Z87891 Personal history of nicotine dependence: Secondary | ICD-10-CM

## 2021-06-10 DIAGNOSIS — Z952 Presence of prosthetic heart valve: Secondary | ICD-10-CM

## 2021-06-10 DIAGNOSIS — Z1329 Encounter for screening for other suspected endocrine disorder: Secondary | ICD-10-CM

## 2021-06-10 DIAGNOSIS — E782 Mixed hyperlipidemia: Secondary | ICD-10-CM | POA: Diagnosis not present

## 2021-06-10 NOTE — Patient Instructions (Signed)
Medication Instructions:  Your physician recommends that you continue on your current medications as directed. Please refer to the Current Medication list given to you today.  *If you need a refill on your cardiac medications before your next appointment, please call your pharmacy*   Lab Work: Your physician recommends that you have labs done in the office today. Your test included  basic metabolic panel, complete blood count, TSH, liver function and lipids.  If you have labs (blood work) drawn today and your tests are completely normal, you will receive your results only by: MyChart Message (if you have MyChart) OR A paper copy in the mail If you have any lab test that is abnormal or we need to change your treatment, we will call you to review the results.   Testing/Procedures: Your physician has requested that you have an echocardiogram. Echocardiography is a painless test that uses sound waves to create images of your heart. It provides your doctor with information about the size and shape of your heart and how well your heart's chambers and valves are working. This procedure takes approximately one hour. There are no restrictions for this procedure.    Follow-Up: At [facility], you and your health needs are our priority.  As part of our continuing mission to provide you with exceptional heart care, we have created designated Provider Care Teams.  These Care Teams include your primary Cardiologist (physician) and Advanced Practice Providers (APPs -  Physician Assistants and Nurse Practitioners) who all work together to provide you with the care you need, when you need it.  We recommend signing up for the patient portal called "MyChart".  Sign up information is provided on this After Visit Summary.  MyChart is used to connect with patients for Virtual Visits (Telemedicine).  Patients are able to view lab/test results, encounter notes, upcoming appointments, etc.  Non-urgent messages can  be sent to your provider as well.   To learn more about what you can do with MyChart, go to [email or web address].    Your next appointment:   6 month(s)  The format for your next appointment:   In Person  Provider:   Belva Crome, MD   Other Instructions Echocardiogram An echocardiogram is a test that uses sound waves (ultrasound) to produce images of the heart. Images from an echocardiogram can provide important information about: Heart size and shape. The size and thickness and movement of your heart's walls. Heart muscle function and strength. Heart valve function or if you have stenosis. Stenosis is when the heart valves are too narrow. If blood is flowing backward through the heart valves (regurgitation). A tumor or infectious growth around the heart valves. Areas of heart muscle that are not working well because of poor blood flow or injury from a heart attack. Aneurysm detection. An aneurysm is a weak or damaged part of an artery wall. The wall bulges out from the normal force of blood pumping through the body. Tell a health care provider about: Any allergies you have. All medicines you are taking, including vitamins, herbs, eye drops, creams, and over-the-counter medicines. Any blood disorders you have. Any surgeries you have had. Any medical conditions you have. Whether you are pregnant or may be pregnant. What are the risks? Generally, this is a safe test. However, problems may occur, including an allergic reaction to dye (contrast) that may be used during the test. What happens before the test? No specific preparation is needed. You may eat and drink normally. What happens during  the test? You will take off your clothes from the waist up and put on a hospital gown. Electrodes or electrocardiogram (ECG)patches may be placed on your chest. The electrodes or patches are then connected to a device that monitors your heart rate and rhythm. You will lie down on a  table for an ultrasound exam. A gel will be applied to your chest to help sound waves pass through your skin. A handheld device, called a transducer, will be pressed against your chest and moved over your heart. The transducer produces sound waves that travel to your heart and bounce back (or "echo" back) to the transducer. These sound waves will be captured in real-time and changed into images of your heart that can be viewed on a video monitor. The images will be recorded on a computer and reviewed by your health care provider. You may be asked to change positions or hold your breath for a short time. This makes it easier to get different views or better views of your heart. In some cases, you may receive contrast through an IV in one of your veins. This can improve the quality of the pictures from your heart. The procedure may vary among health care providers and hospitals.   What can I expect after the test? You may return to your normal, everyday life, including diet, activities, and medicines, unless your health care provider tells you not to do that. Follow these instructions at home: It is up to you to get the results of your test. Ask your health care provider, or the department that is doing the test, when your results will be ready. Keep all follow-up visits. This is important. Summary An echocardiogram is a test that uses sound waves (ultrasound) to produce images of the heart. Images from an echocardiogram can provide important information about the size and shape of your heart, heart muscle function, heart valve function, and other possible heart problems. You do not need to do anything to prepare before this test. You may eat and drink normally. After the echocardiogram is completed, you may return to your normal, everyday life, unless your health care provider tells you not to do that. This information is not intended to replace advice given to you by your health care provider. Make  sure you discuss any questions you have with your health care provider. Document Revised: 03/23/2020 Document Reviewed: 03/23/2020 Elsevier Patient Education  2021 [employer].

## 2021-06-10 NOTE — Progress Notes (Signed)
Cardiology Office Note:    Date:  06/10/2021   ID:  [name], DOB Nov 05, 1960, MRN [id number]  PCP:  Practice, [facility]  Cardiologist:  Garwin Brothers, MD   Referring MD: Practice, Duanne Limerick*    ASSESSMENT:    1. Coronary artery disease involving native coronary artery of native heart without angina pectoris   2. S/P AVR   3. Mixed dyslipidemia   4. Former cigarette smoker    PLAN:    In order of problems listed above:  Coronary artery disease: Secondary prevention stressed with the patient.  Importance of compliance with diet medication stressed and he vocalized understanding.  He was advised to walk at least half an hour 5 days a week and he promises to do so. Essential hypertension: Blood pressure stable and diet was emphasized.  Lifestyle modification urged he has an element of whitecoat hypertension and his blood pressures at home are fine. Postsurgical replacement and aortic aneurysm repair: Stable at this time and we will do an echocardiogram for follow up and monitoring. Mixed dyslipidemia: He does not like the feeling with atorvastatin and so I will look at his lipids and change it to rosuvastatin. Patient will be seen in follow-up appointment in 6 months or earlier if the patient has any concerns    Medication Adjustments/Labs and Tests Ordered: Current medicines are reviewed at length with the patient today.  Concerns regarding medicines are outlined above.  No orders of the defined types were placed in this encounter.  No orders of the defined types were placed in this encounter.    No chief complaint on file.    History of Present Illness:    Jdyn Parkerson is a 60 y.o. male.  Patient has past medical history of aortic aneurysm and aortic valve replacement.  The aneurysm was repaired.  He has history of essential hypertension dyslipidemia and former smoking.  He does not smoke now.  He has an element of whitecoat hypertension.  He  denies any chest pain orthopnea or PND.  He walks on a regular basis.  At the time of my evaluation, the patient is alert awake oriented and in no distress.  Past Medical History:  Diagnosis Date   Choledocholithiasis 12/30/2018   Coronary artery disease    Deep vein thrombosis (DVT) of right upper extremity (HCC) 10/28/2018   Food impaction of esophagus 12/11/2017   Former cigarette smoker 06/15/2016   GERD (gastroesophageal reflux disease)    Hiatal hernia    Hyponatremia 12/30/2018   Mixed dyslipidemia 04/10/2018   Pneumonia    PONV (postoperative nausea and vomiting)    S/P aortic aneurysm repair 03/13/2018   S/P AVR 02/19/2018   Severe aortic stenosis    Thoracic aortic aneurysm     Past Surgical History:  Procedure Laterality Date   BENTALL PROCEDURE N/A 02/19/2018   Procedure: BENTALL PROCEDURE;  Surgeon: Alleen Borne, MD;  Location: [facility] OR;  Service: Open Heart Surgery;  Laterality: N/A;  RIGHT AXILLARY CANNULATION  CIRC ARREST  BILATERAL RADIAL ARTERIAL LINES   CHOLECYSTECTOMY     CORONARY ARTERY BYPASS GRAFT N/A 02/19/2018   Procedure: CORONARY ARTERY BYPASS GRAFTING (CABG)x3. LIMA TO LAD. SVG TO PL. SVG to OM. SAPHENOUS VEIN HARVEST.;  Surgeon: Alleen Borne, MD;  Location: [facility] OR;  Service: Open Heart Surgery;  Laterality: N/A;   ESOPHAGOGASTRODUODENOSCOPY (EGD) WITH PROPOFOL N/A 12/11/2017   Procedure: ESOPHAGOGASTRODUODENOSCOPY (EGD) WITH PROPOFOL;  Surgeon: Iva Boop, MD;  Location: [facility] ENDOSCOPY;  Service: Endoscopy;  Laterality: N/A;   FOREIGN BODY REMOVAL N/A 12/11/2017   Procedure: FOREIGN BODY REMOVAL;  Surgeon: Iva Boop, MD;  Location: [facility] ENDOSCOPY;  Service: Endoscopy;  Laterality: N/A;   MULTIPLE TOOTH EXTRACTIONS     REPLACEMENT ASCENDING AORTA N/A 02/19/2018   Procedure: REPLACEMENT ASCENDING AORTA;  Surgeon: Alleen Borne, MD;  Location: [facility] OR;  Service: Open Heart Surgery;  Laterality: N/A;   RIGHT/LEFT HEART CATH AND CORONARY ANGIOGRAPHY N/A  12/04/2017   Procedure: RIGHT/LEFT HEART CATH AND CORONARY ANGIOGRAPHY;  Surgeon: Lyn Records, MD;  Location: [facility] INVASIVE CV LAB;  Service: Cardiovascular;  Laterality: N/A;   TEE WITHOUT CARDIOVERSION N/A 02/19/2018   Procedure: TRANSESOPHAGEAL ECHOCARDIOGRAM (TEE);  Surgeon: Alleen Borne, MD;  Location[facility];  Service: Open Heart Surgery;  Laterality: N/A;    Current Medications: Current Meds  Medication Sig   aspirin EC 81 MG tablet Take 1 tablet (81 mg total) by mouth daily.   atorvastatin (LIPITOR) 40 MG tablet Take 20 mg by mouth daily.   gabapentin (NEURONTIN) 100 MG capsule Take 100 mg by mouth 3 (three) times daily.   metoprolol tartrate (LOPRESSOR) 25 MG tablet TAKE 1 TABLET BY MOUTH TWICE A DAY   triamcinolone lotion (KENALOG) 0.1 % Apply 1 application topically 2 (two) times daily.     Allergies:   Patient has no known allergies.   Social History   Socioeconomic History   Marital status: Single    Spouse name: Not on file   Number of children: Not on file   Years of education: Not on file   Highest education level: Not on file  Occupational History   Not on file  Tobacco Use   Smoking status: Former    Types: Cigarettes    Quit date: 09/14/2017    Years since quitting: 3.7   Smokeless tobacco: Never  Vaping Use   Vaping Use: Never used  Substance and Sexual Activity   Alcohol use: Yes    Comment: occasional on weekends   Drug use: Never   Sexual activity: Not on file  Other Topics Concern   Not on file  Social History Narrative   Not on file   Social Determinants of Health   Financial Resource Strain: Not on file  Food Insecurity: Not on file  Transportation Needs: Not on file  Physical Activity: Not on file  Stress: Not on file  Social Connections: Not on file     Family History: The patient's family history includes Diabetes in his mother and sister; Heart disease in his father and sister; Stroke in his father; Throat cancer in his  father.  ROS:   Please see the history of present illness.    All other systems reviewed and are negative.  EKGs/Labs/Other Studies Reviewed:    The following studies were reviewed today: I discussed my findings with the patient extensively.   Recent Labs: 11/15/2020: ALT 20; BUN 13; Creatinine, Ser 0.83; Hemoglobin 14.5; Platelets 166; Potassium 4.7; Sodium 138; TSH 1.640  Recent Lipid Panel    Component Value Date/Time   CHOL 148 11/15/2020 0842   TRIG 97 11/15/2020 0842   HDL 56 11/15/2020 0842   CHOLHDL 2.6 11/15/2020 0842   LDLCALC 74 11/15/2020 0842    Physical Exam:    VS:  BP (!) 162/92   Pulse 70   Ht 5\' 6"  (1.676 m)   Wt 231 lb 3.2 oz (104.9 kg)   SpO2 99%   BMI  37.32 kg/m     Wt Readings from Last 3 Encounters:  06/10/21 231 lb 3.2 oz (104.9 kg)  11/15/20 225 lb 3.2 oz (102.2 kg)  07/14/20 223 lb (101.2 kg)     GEN: Patient is in no acute distress HEENT: Normal NECK: No JVD; No carotid bruits LYMPHATICS: No lymphadenopathy CARDIAC: Hear sounds regular, 2/6 systolic murmur at the apex. RESPIRATORY:  Clear to auscultation without rales, wheezing or rhonchi  ABDOMEN: Soft, non-tender, non-distended MUSCULOSKELETAL:  No edema; No deformity  SKIN: Warm and dry NEUROLOGIC:  Alert and oriented x 3 PSYCHIATRIC:  Normal affect   Signed, Garwin Brothers, MD  06/10/2021 8:51 AM    [facility]

## 2021-06-11 LAB — BASIC METABOLIC PANEL
BUN/Creatinine Ratio: 11 (ref 10–24)
BUN: 10 mg/dL (ref 8–27)
CO2: 25 mmol/L (ref 20–29)
Calcium: 9.3 mg/dL (ref 8.6–10.2)
Chloride: 101 mmol/L (ref 96–106)
Creatinine, Ser: 0.94 mg/dL (ref 0.76–1.27)
Glucose: 85 mg/dL (ref 70–99)
Potassium: 4.9 mmol/L (ref 3.5–5.2)
Sodium: 138 mmol/L (ref 134–144)
eGFR: 92 mL/min/{1.73_m2} (ref >59)

## 2021-06-11 LAB — CBC WITH DIFFERENTIAL/PLATELET
Basophils Absolute: 0.1 10*3/uL (ref 0.0–0.2)
Basos: 1 %
EOS (ABSOLUTE): 0.3 10*3/uL (ref 0.0–0.4)
Eos: 5 %
Hematocrit: 43.1 % (ref 37.5–51.0)
Hemoglobin: 14.6 g/dL (ref 13.0–17.7)
Immature Grans (Abs): 0 10*3/uL (ref 0.0–0.1)
Immature Granulocytes: 0 %
Lymphocytes Absolute: 1.6 10*3/uL (ref 0.7–3.1)
Lymphs: 27 %
MCH: 28.9 pg (ref 26.6–33.0)
MCHC: 33.9 g/dL (ref 31.5–35.7)
MCV: 85 fL (ref 79–97)
Monocytes Absolute: 0.5 10*3/uL (ref 0.1–0.9)
Monocytes: 8 %
Neutrophils Absolute: 3.6 10*3/uL (ref 1.4–7.0)
Neutrophils: 59 %
Platelets: 171 10*3/uL (ref 150–450)
RBC: 5.05 x10E6/uL (ref 4.14–5.80)
RDW: 12.9 % (ref 11.6–15.4)
WBC: 6.1 10*3/uL (ref 3.4–10.8)

## 2021-06-11 LAB — LIPID PANEL
Chol/HDL Ratio: 2.8 ratio (ref 0.0–5.0)
Cholesterol, Total: 157 mg/dL (ref 100–199)
HDL: 56 mg/dL (ref >39)
LDL Chol Calc (NIH): 84 mg/dL (ref 0–99)
Triglycerides: 94 mg/dL (ref 0–149)
VLDL Cholesterol Cal: 17 mg/dL (ref 5–40)

## 2021-06-11 LAB — HEPATIC FUNCTION PANEL
ALT: 18 IU/L (ref 0–44)
AST: 24 IU/L (ref 0–40)
Albumin: 4.5 g/dL (ref 3.8–4.8)
Alkaline Phosphatase: 71 IU/L (ref 44–121)
Bilirubin Total: 0.4 mg/dL (ref 0.0–1.2)
Bilirubin, Direct: 0.11 mg/dL (ref 0.00–0.40)
Total Protein: 6.7 g/dL (ref 6.0–8.5)

## 2021-06-11 LAB — TSH: TSH: 1.9 u[IU]/mL (ref 0.450–4.500)

## 2021-06-15 ENCOUNTER — Telehealth: Payer: Self-pay

## 2021-06-15 NOTE — Telephone Encounter (Signed)
-----   Message from Garwin Brothers, MD sent at 06/13/2021  8:36 AM EDT ----- The results of the study is unremarkable. Please inform patient. I will discuss in detail at next appointment. Cc  primary care/referring physician Garwin Brothers, MD 06/13/2021 8:36 AM

## 2021-06-15 NOTE — Telephone Encounter (Signed)
Tried calling patient. No answer and no voicemail set up for me to leave a message. 

## 2021-06-24 ENCOUNTER — Other Ambulatory Visit: Payer: Self-pay

## 2021-06-24 ENCOUNTER — Ambulatory Visit (INDEPENDENT_AMBULATORY_CARE_PROVIDER_SITE_OTHER): Payer: 59

## 2021-06-24 DIAGNOSIS — I2510 Atherosclerotic heart disease of native coronary artery without angina pectoris: Secondary | ICD-10-CM

## 2021-06-24 LAB — ECHOCARDIOGRAM COMPLETE
AR max vel: 0.97 cm2
AV Area VTI: 1.24 cm2
AV Area mean vel: 1.04 cm2
AV Mean grad: 11.5 mmHg
AV Peak grad: 21.3 mmHg
Ao pk vel: 2.31 m/s
Area-P 1/2: 3.93 cm2
Calc EF: 46.3 %
S' Lateral: 3.6 cm
Single Plane A2C EF: 46.1 %
Single Plane A4C EF: 47.1 %

## 2021-06-27 ENCOUNTER — Telehealth: Payer: Self-pay

## 2021-06-27 ENCOUNTER — Other Ambulatory Visit: Payer: Self-pay | Admitting: Surgery

## 2021-06-27 DIAGNOSIS — I712 Thoracic aortic aneurysm, without rupture, unspecified: Secondary | ICD-10-CM

## 2021-06-27 DIAGNOSIS — I7121 Aneurysm of the ascending aorta, without rupture: Secondary | ICD-10-CM

## 2021-06-27 NOTE — Telephone Encounter (Signed)
No VM set up.

## 2021-06-27 NOTE — Telephone Encounter (Signed)
-----   Message from Garwin Brothers, MD sent at 06/24/2021  4:50 PM EST ----- Overall things are stable.  Heart function is mildly depressed.  So I want to start him on Entresto low-dose.  Please do so and bring him in for a Chem-7 in 1 week.  Let him keep it above his blood pressures at home.  Copy primary care.  This medicine can help improve his heart function. Garwin Brothers, MD 06/24/2021 4:50 PM

## 2021-06-29 DIAGNOSIS — I2510 Atherosclerotic heart disease of native coronary artery without angina pectoris: Secondary | ICD-10-CM

## 2021-06-29 MED ORDER — ENTRESTO 24-26 MG PO TABS: 1.0000 | ORAL_TABLET | Freq: Two times a day (BID) | ORAL | 3 refills | Status: DC

## 2021-06-30 ENCOUNTER — Telehealth: Payer: Self-pay

## 2021-06-30 NOTE — Telephone Encounter (Signed)
Pt is unaware at this time of the medication. I have mailed a letter to the pt and requested he call the office.

## 2021-07-05 ENCOUNTER — Telehealth: Payer: Self-pay

## 2021-07-05 MED ORDER — ENTRESTO 24-26 MG PO TABS: 1.0000 | ORAL_TABLET | Freq: Two times a day (BID) | ORAL | 3 refills | Status: DC

## 2021-07-05 NOTE — Telephone Encounter (Signed)
Prior Auth for [place] initiated through cover my meds. Determination is pending. Confirmation#[id number]

## 2021-07-05 NOTE — Addendum Note (Signed)
Addended by: Heywood Bene on: 07/05/2021 04:56 PM   Modules accepted: Orders

## 2021-07-05 NOTE — Telephone Encounter (Signed)
Per Cover my Meds, Tyler Kemp has been approved, pharmacy notified however, the cost is more and they suggest a preferred pharmacy [facility] as [facility]. I spoke with the patient agreed to [facility]

## 2021-07-16 LAB — BASIC METABOLIC PANEL
BUN/Creatinine Ratio: 11 (ref 10–24)
BUN: 11 mg/dL (ref 8–27)
CO2: 22 mmol/L (ref 20–29)
Calcium: 9.3 mg/dL (ref 8.6–10.2)
Chloride: 102 mmol/L (ref 96–106)
Creatinine, Ser: 0.96 mg/dL (ref 0.76–1.27)
Glucose: 80 mg/dL (ref 70–99)
Potassium: 4.3 mmol/L (ref 3.5–5.2)
Sodium: 137 mmol/L (ref 134–144)
eGFR: 89 mL/min/{1.73_m2} (ref >59)

## 2021-08-17 ENCOUNTER — Inpatient Hospital Stay: Admission: RE | Admit: 2021-08-17 | Payer: 59 | Source: Ambulatory Visit

## 2021-08-17 ENCOUNTER — Ambulatory Visit: Payer: 59 | Admitting: Surgery

## 2021-10-22 ENCOUNTER — Other Ambulatory Visit: Payer: Self-pay | Admitting: Cardiology

## 2021-12-09 ENCOUNTER — Ambulatory Visit: Payer: 59 | Admitting: Cardiology

## 2022-05-14 ENCOUNTER — Other Ambulatory Visit: Payer: Self-pay | Admitting: Cardiology

## 2022-06-07 ENCOUNTER — Other Ambulatory Visit: Payer: Self-pay | Admitting: Cardiology

## 2022-07-10 ENCOUNTER — Ambulatory Visit: Payer: Medicaid Other | Attending: Cardiology | Admitting: Cardiology

## 2022-07-10 ENCOUNTER — Encounter: Payer: Self-pay | Admitting: Cardiology

## 2022-07-10 VITALS — BP 158/90 | HR 79 | Ht 67.0 in | Wt 230.6 lb

## 2022-07-10 DIAGNOSIS — E782 Mixed hyperlipidemia: Secondary | ICD-10-CM | POA: Diagnosis not present

## 2022-07-10 DIAGNOSIS — Z952 Presence of prosthetic heart valve: Secondary | ICD-10-CM | POA: Diagnosis not present

## 2022-07-10 DIAGNOSIS — Z9889 Other specified postprocedural states: Secondary | ICD-10-CM

## 2022-07-10 DIAGNOSIS — I2510 Atherosclerotic heart disease of native coronary artery without angina pectoris: Secondary | ICD-10-CM | POA: Diagnosis not present

## 2022-07-10 DIAGNOSIS — Z8679 Personal history of other diseases of the circulatory system: Secondary | ICD-10-CM

## 2022-07-10 DIAGNOSIS — Z87891 Personal history of nicotine dependence: Secondary | ICD-10-CM

## 2022-07-10 MED ORDER — VALSARTAN 80 MG PO TABS: 80.0000 mg | ORAL_TABLET | Freq: Every day | ORAL | 3 refills | Status: DC

## 2022-07-10 NOTE — Progress Notes (Signed)
Cardiology Office Note:    Date:  07/10/2022   ID:  Tyler Kemp, DOB Oct 07, 1960, MRN [id number]  PCP:  Practice, [facility]  Cardiologist:  Garwin Brothers, MD   Referring MD: Practice, Duanne Limerick*    ASSESSMENT:    1. Coronary artery disease involving native coronary artery of native heart without angina pectoris   2. S/P AVR   3. S/P aortic aneurysm repair   4. Mixed dyslipidemia   5. Former cigarette smoker    PLAN:    In order of problems listed above:  Coronary artery disease: Secondary prevention stressed with the patient.  Importance of compliance with diet medication stressed any vocalized understanding. Essential hypertension: Blood pressure is elevated.  Diet emphasized.  He has not picked up his Entresto from the prescription last time.  He is not very compliant with medical advice.  I will have initiated him on valsartan and get an echocardiogram for follow-up and to [facility] if necessary.  Compliance urged. Cardiomyopathy: Mild in nature.  Will have an echocardiogram to follow-up. Post aortic valve replacement and aortic aneurysm repair: Stable at this time. Obesity: Weight reduction stressed and diet was emphasized.  Lifestyle modification urged him he promises to do better. Patient will be seen in follow-up appointment in 6 months or earlier if the patient has any concerns    Medication Adjustments/Labs and Tests Ordered: Current medicines are reviewed at length with the patient today.  Concerns regarding medicines are outlined above.  No orders of the defined types were placed in this encounter.  No orders of the defined types were placed in this encounter.    No chief complaint on file.    History of Present Illness:    Tyler Kemp is a 61 y.o. male.  Patient has past medical history of coronary artery disease and aortic valve replacement and aortic aneurysm repair.  He denies any problems at this time and takes care of activities of  daily living.  No chest pain orthopnea or PND.  At the time of my evaluation, the patient is alert awake oriented and in no distress.  Past Medical History:  Diagnosis Date   Choledocholithiasis 12/30/2018   Coronary artery disease    Deep vein thrombosis (DVT) of right upper extremity (HCC) 10/28/2018   Food impaction of esophagus 12/11/2017   Former cigarette smoker 06/15/2016   GERD (gastroesophageal reflux disease)    Hiatal hernia    Hyponatremia 12/30/2018   Mixed dyslipidemia 04/10/2018   Pneumonia    PONV (postoperative nausea and vomiting)    S/P aortic aneurysm repair 03/13/2018   S/P AVR 02/19/2018   Severe aortic stenosis    Thoracic aortic aneurysm [facility])     Past Surgical History:  Procedure Laterality Date   BENTALL PROCEDURE N/A 02/19/2018   Procedure: BENTALL PROCEDURE;  Surgeon: Alleen Borne, MD;  Location: [facility] OR;  Service: Open Heart Surgery;  Laterality: N/A;  RIGHT AXILLARY CANNULATION  CIRC ARREST  BILATERAL RADIAL ARTERIAL LINES   CHOLECYSTECTOMY     CORONARY ARTERY BYPASS GRAFT N/A 02/19/2018   Procedure: CORONARY ARTERY BYPASS GRAFTING (CABG)x3. LIMA TO LAD. SVG TO PL. SVG to OM. SAPHENOUS VEIN HARVEST.;  Surgeon: Alleen Borne, MD;  Location: [facility] OR;  Service: Open Heart Surgery;  Laterality: N/A;   ESOPHAGOGASTRODUODENOSCOPY (EGD) WITH PROPOFOL N/A 12/11/2017   Procedure: ESOPHAGOGASTRODUODENOSCOPY (EGD) WITH PROPOFOL;  Surgeon: Iva Boop, MD;  Location: [facility] ENDOSCOPY;  Service: Endoscopy;  Laterality: N/A;   FOREIGN BODY  REMOVAL N/A 12/11/2017   Procedure: FOREIGN BODY REMOVAL;  Surgeon: Iva Boop, MD;  Location: [facility] ENDOSCOPY;  Service: Endoscopy;  Laterality: N/A;   MULTIPLE TOOTH EXTRACTIONS     REPLACEMENT ASCENDING AORTA N/A 02/19/2018   Procedure: REPLACEMENT ASCENDING AORTA;  Surgeon: Alleen Borne, MD;  Location: [facility] OR;  Service: Open Heart Surgery;  Laterality: N/A;   RIGHT/LEFT HEART CATH AND CORONARY ANGIOGRAPHY N/A 12/04/2017   Procedure:  RIGHT/LEFT HEART CATH AND CORONARY ANGIOGRAPHY;  Surgeon: Lyn Records, MD;  Location: [facility] INVASIVE CV LAB;  Service: Cardiovascular;  Laterality: N/A;   TEE WITHOUT CARDIOVERSION N/A 02/19/2018   Procedure: TRANSESOPHAGEAL ECHOCARDIOGRAM (TEE);  Surgeon: Alleen Borne, MD;  Location: [facility] OR;  Service: Open Heart Surgery;  Laterality: N/A;    Current Medications: Current Meds  Medication Sig   aspirin EC 81 MG tablet Take 1 tablet (81 mg total) by mouth daily.   atorvastatin (LIPITOR) 40 MG tablet Take 20 mg by mouth daily.   metoprolol tartrate (LOPRESSOR) 25 MG tablet TAKE 1 TABLET BY MOUTH TWICE A DAY     Allergies:   Patient has no known allergies.   Social History   Socioeconomic History   Marital status: Single    Spouse name: Not on file   Number of children: Not on file   Years of education: Not on file   Highest education level: Not on file  Occupational History   Not on file  Tobacco Use   Smoking status: Former    Types: Cigarettes    Quit date: 09/14/2017    Years since quitting: 4.8   Smokeless tobacco: Never  Vaping Use   Vaping Use: Never used  Substance and Sexual Activity   Alcohol use: Yes    Comment: occasional on weekends   Drug use: Never   Sexual activity: Not on file  Other Topics Concern   Not on file  Social History Narrative   Not on file   Social Determinants of Health   Financial Resource Strain: Not on file  Food Insecurity: Not on file  Transportation Needs: Not on file  Physical Activity: Not on file  Stress: Not on file  Social Connections: Not on file     Family History: The patient's family history includes Diabetes in his mother and sister; Heart disease in his father and sister; Stroke in his father; Throat cancer in his father.  ROS:   Please see the history of present illness.    All other systems reviewed and are negative.  EKGs/Labs/Other Studies Reviewed:    The following studies were reviewed today: I discussed my  findings with the patient at length.   Recent Labs: 07/15/2021: BUN 11; Creatinine, Ser 0.96; Potassium 4.3; Sodium 137  Recent Lipid Panel    Component Value Date/Time   CHOL 157 06/10/2021 0859   TRIG 94 06/10/2021 0859   HDL 56 06/10/2021 0859   CHOLHDL 2.8 06/10/2021 0859   LDLCALC 84 06/10/2021 0859    Physical Exam:    VS:  BP (!) 158/90   Pulse 79   Ht 5\' 7"  (1.702 m)   Wt 230 lb 9.6 oz (104.6 kg)   SpO2 98%   BMI 36.12 kg/m     Wt Readings from Last 3 Encounters:  07/10/22 230 lb 9.6 oz (104.6 kg)  06/10/21 231 lb 3.2 oz (104.9 kg)  11/15/20 225 lb 3.2 oz (102.2 kg)     GEN: Patient is in no acute distress  HEENT: Normal NECK: No JVD; No carotid bruits LYMPHATICS: No lymphadenopathy CARDIAC: Hear sounds regular, 2/6 systolic murmur at the apex. RESPIRATORY:  Clear to auscultation without rales, wheezing or rhonchi  ABDOMEN: Soft, non-tender, non-distended MUSCULOSKELETAL:  No edema; No deformity  SKIN: Warm and dry NEUROLOGIC:  Alert and oriented x 3 PSYCHIATRIC:  Normal affect   Signed, Garwin Brothers, MD  07/10/2022 3:12 PM    [facility]

## 2022-07-10 NOTE — Patient Instructions (Signed)
Medication Instructions:  Your physician has recommended you make the following change in your medication:   Start Valsartan 80 mg daily.  *If you need a refill on your cardiac medications before your next appointment, please call your pharmacy*   Lab Work: Your physician recommends that you return for lab work in: 1 week for a BMET. You can come Monday through Friday 8:30 am to 12:00 pm and 1:15 to 4:30. You do not need to make an appointment as the order has already been placed. .  If you have labs (blood work) drawn today and your tests are completely normal, you will receive your results only by: MyChart Message (if you have MyChart) OR A paper copy in the mail If you have any lab test that is abnormal or we need to change your treatment, we will call you to review the results.   Testing/Procedures: Your physician has requested that you have an echocardiogram. Echocardiography is a painless test that uses sound waves to create images of your heart. It provides your doctor with information about the size and shape of your heart and how well your heart's chambers and valves are working. This procedure takes approximately one hour. There are no restrictions for this procedure.    Follow-Up: At [facility], you and your health needs are our priority.  As part of our continuing mission to provide you with exceptional heart care, we have created designated Provider Care Teams.  These Care Teams include your primary Cardiologist (physician) and Advanced Practice Providers (APPs -  Physician Assistants and Nurse Practitioners) who all work together to provide you with the care you need, when you need it.  We recommend signing up for the patient portal called "MyChart".  Sign up information is provided on this After Visit Summary.  MyChart is used to connect with patients for Virtual Visits (Telemedicine).  Patients are able to view lab/test results, encounter notes, upcoming appointments,  etc.  Non-urgent messages can be sent to your provider as well.   To learn more about what you can do with MyChart, go to [email or web address].    Your next appointment:   9 month(s)  The format for your next appointment:   In Person  Provider:   Belva Crome, MD   Other Instructions Valsartan Tablets What is this medication? VALSARTAN (val SAR tan) treats high blood pressure and heart failure. It may also be used to prevent further damage after a heart attack. It works by relaxing the blood vessels, which decreases blood pressure and the amount of work the heart has to do. It belongs to a group of medications called ARBs. This medicine may be used for other purposes; ask your health care provider or pharmacist if you have questions. COMMON BRAND NAME(S): Diovan What should I tell my care team before I take this medication? They need to know if you have any of these conditions: Heart failure Kidney disease Liver disease An unusual or allergic reaction to valsartan, other medications, foods, dyes, or preservatives Pregnant or trying to get pregnant Breast-feeding How should I use this medication? Take this medication by mouth. Take it as directed on the prescription label at the same time every day. You can take it with or without food. If it upsets your stomach, take it with food. Keep taking it unless your care team tells you to stop. Talk to your care team about the use of this medication in children. While it may be prescribed for children as young as  1 for selected conditions, precautions do apply. Overdosage: If you think you have taken too much of this medicine contact a poison control center or emergency room at once. NOTE: This medicine is only for you. Do not share this medicine with others. What if I miss a dose? If you miss a dose, take it as soon as you can. If it is almost time for your next dose, take only that dose. Do not take double or extra doses. What may  interact with this medication? Aliskiren ACE inhibitors, such as enalapril or lisinopril Diuretics, especially amiloride, eplerenone, spironolactone, or triamterene Lithium NSAIDs, medications for pain and inflammation, such as ibuprofen or naproxen Potassium salts or potassium supplements This list may not describe all possible interactions. Give your health care provider a list of all the medicines, herbs, non-prescription drugs, or dietary supplements you use. Also tell them if you smoke, drink alcohol, or use illegal drugs. Some items may interact with your medicine. What should I watch for while using this medication? Visit your care team for regular checks on your progress. Check your blood pressure as directed. Ask your care team what your blood pressure should be. Also, find out when you should contact him or her. Do not treat yourself for coughs, colds, or pain while you are taking this medication without asking your care team for advice. Some medications may increase your blood pressure. Women should inform their care team if they wish to become pregnant or think they might be pregnant. There is a potential for serious side effects to an unborn child. Talk to your care team for more information. This medication may affect your coordination, reaction time, or judgment. Do not drive or operate machinery until you know how this medication affects you. Sit up or stand slowly to reduce the risk of dizzy or fainting spells. Drinking alcohol with this medication can increase the risk of these side effects. Avoid salt substitutes unless you are told otherwise by your care team. What side effects may I notice from receiving this medication? Side effects that you should report to your care team as soon as possible: Allergic reactions--skin rash, itching, hives, swelling of the face, lips, tongue, or throat High potassium level--muscle weakness, fast or irregular heartbeat Kidney injury--decrease in  the amount of urine, swelling of the ankles, hands, or feet Low blood pressure--dizziness, feeling faint or lightheaded, blurry vision Side effects that usually do not require medical attention (report to your care team if they continue or are bothersome): Dizziness Fatigue Headache This list may not describe all possible side effects. Call your doctor for medical advice about side effects. You may report side effects to FDA at 1-800-FDA-1088. Where should I keep my medication? Keep out of the reach of children and pets. Store at room temperature between 20 and 25 degrees C (68 and 77 degrees F). Protect from moisture. Keep the container tightly closed. Get rid of any unused medication after the expiration date. To get rid of medications that are no longer needed or have expired: Take the medication to a medication take-back program. Check with your pharmacy or law enforcement to find a location. If you cannot return the medication, check the label or package insert to see if the medication should be thrown out in the garbage or flushed down the toilet. If you are not sure, ask your care team. If it is safe to put it in the trash, take the medication out of the container. Mix the medication with cat  litter, dirt, coffee grounds, or other unwanted substance. Seal the mixture in a bag or container. Put it in the trash. NOTE: This sheet is a summary. It may not cover all possible information. If you have questions about this medicine, talk to your doctor, pharmacist, or health care provider.  2023 Elsevier/Gold Standard (2020-10-08 00:00:00)   Echocardiogram An echocardiogram is a test that uses sound waves (ultrasound) to produce images of the heart. Images from an echocardiogram can provide important information about: Heart size and shape. The size and thickness and movement of your heart's walls. Heart muscle function and strength. Heart valve function or if you have stenosis. Stenosis is  when the heart valves are too narrow. If blood is flowing backward through the heart valves (regurgitation). A tumor or infectious growth around the heart valves. Areas of heart muscle that are not working well because of poor blood flow or injury from a heart attack. Aneurysm detection. An aneurysm is a weak or damaged part of an artery wall. The wall bulges out from the normal force of blood pumping through the body. Tell a health care provider about: Any allergies you have. All medicines you are taking, including vitamins, herbs, eye drops, creams, and over-the-counter medicines. Any blood disorders you have. Any surgeries you have had. Any medical conditions you have. Whether you are pregnant or may be pregnant. What are the risks? Generally, this is a safe test. However, problems may occur, including an allergic reaction to dye (contrast) that may be used during the test. What happens before the test? No specific preparation is needed. You may eat and drink normally. What happens during the test? You will take off your clothes from the waist up and put on a hospital gown. Electrodes or electrocardiogram (ECG)patches may be placed on your chest. The electrodes or patches are then connected to a device that monitors your heart rate and rhythm. You will lie down on a table for an ultrasound exam. A gel will be applied to your chest to help sound waves pass through your skin. A handheld device, called a transducer, will be pressed against your chest and moved over your heart. The transducer produces sound waves that travel to your heart and bounce back (or "echo" back) to the transducer. These sound waves will be captured in real-time and changed into images of your heart that can be viewed on a video monitor. The images will be recorded on a computer and reviewed by your health care provider. You may be asked to change positions or hold your breath for a short time. This makes it easier to get  different views or better views of your heart. In some cases, you may receive contrast through an IV in one of your veins. This can improve the quality of the pictures from your heart. The procedure may vary among health care providers and hospitals.   What can I expect after the test? You may return to your normal, everyday life, including diet, activities, and medicines, unless your health care provider tells you not to do that. Follow these instructions at home: It is up to you to get the results of your test. Ask your health care provider, or the department that is doing the test, when your results will be ready. Keep all follow-up visits. This is important. Summary An echocardiogram is a test that uses sound waves (ultrasound) to produce images of the heart. Images from an echocardiogram can provide important information about the size and shape  of your heart, heart muscle function, heart valve function, and other possible heart problems. You do not need to do anything to prepare before this test. You may eat and drink normally. After the echocardiogram is completed, you may return to your normal, everyday life, unless your health care provider tells you not to do that. This information is not intended to replace advice given to you by your health care provider. Make sure you discuss any questions you have with your health care provider. Document Revised: 03/23/2020 Document Reviewed: 03/23/2020 Elsevier Patient Education  2021 [employer].

## 2022-07-12 ENCOUNTER — Other Ambulatory Visit: Payer: Self-pay | Admitting: Cardiology

## 2022-07-18 LAB — BASIC METABOLIC PANEL
BUN/Creatinine Ratio: 15 (ref 10–24)
BUN: 13 mg/dL (ref 8–27)
CO2: 25 mmol/L (ref 20–29)
Calcium: 9.1 mg/dL (ref 8.6–10.2)
Chloride: 101 mmol/L (ref 96–106)
Creatinine, Ser: 0.89 mg/dL (ref 0.76–1.27)
Glucose: 79 mg/dL (ref 70–99)
Potassium: 4.4 mmol/L (ref 3.5–5.2)
Sodium: 137 mmol/L (ref 134–144)
eGFR: 96 mL/min/{1.73_m2} (ref >59)

## 2022-07-25 ENCOUNTER — Ambulatory Visit: Payer: Medicaid Other | Attending: Cardiology

## 2022-07-25 DIAGNOSIS — I2510 Atherosclerotic heart disease of native coronary artery without angina pectoris: Secondary | ICD-10-CM

## 2022-07-25 LAB — ECHOCARDIOGRAM COMPLETE
AV Mean grad: 9 mmHg
AV Peak grad: 13.6 mmHg
Ao pk vel: 1.85 m/s
Area-P 1/2: 4.63 cm2
Calc EF: 43.2 %
MV M vel: 4.93 m/s
MV Peak grad: 97.2 mmHg
S' Lateral: 4.3 cm
Single Plane A2C EF: 38.5 %
Single Plane A4C EF: 49.1 %

## 2022-07-26 ENCOUNTER — Telehealth: Payer: Self-pay

## 2022-07-26 DIAGNOSIS — Q254 Congenital malformation of aorta unspecified: Secondary | ICD-10-CM

## 2022-07-26 NOTE — Telephone Encounter (Signed)
-----   Message from Garwin Brothers, MD sent at 07/26/2022 10:01 AM EST ----- Echo is unremarkable.  I would like to get a CT of the chest without contrast to assess dilated ascending aorta.  He is postsurgery.  the results of the study is unremarkable. Please inform patient. I will discuss in detail at next appointment. Cc  primary care/referring physician Garwin Brothers, MD 07/26/2022 10:01 AM

## 2022-07-26 NOTE — Telephone Encounter (Signed)
Left VM for Tyler Kemp to callback as pt ask me to call her and discuss results and upcoming test.

## 2022-07-27 ENCOUNTER — Other Ambulatory Visit: Payer: Self-pay | Admitting: Cardiology

## 2022-07-27 NOTE — Telephone Encounter (Signed)
Refills to pharmacy 

## 2022-08-21 ENCOUNTER — Telehealth: Payer: Self-pay | Admitting: Cardiology

## 2022-08-21 NOTE — Telephone Encounter (Signed)
Sister called in to say that the patient insurance will not cover his CT. Calling to see what other options are their. Please advise

## 2022-08-23 NOTE — Telephone Encounter (Signed)
Called Blossom Hoops and left a message for her to call back

## 2022-08-24 ENCOUNTER — Telehealth: Payer: Self-pay | Admitting: Cardiology

## 2022-08-24 NOTE — Telephone Encounter (Signed)
Left vm for Bethena Roys to callback.  Abdul-Razzaaq, Liana Crocker, RN Good afternoon,  Pt's auth for CT is now approved. I've updated the notes and referral.  Thanks, Anisah

## 2022-08-24 NOTE — Telephone Encounter (Signed)
Called patient to make him aware of Chest CT scheduled at [facility] on 08/28/22 at 9:30 AM. No answer- unable to leave message due to no voicemail set up.

## 2022-08-25 NOTE — Telephone Encounter (Signed)
Left vm to call back

## 2022-08-29 NOTE — Telephone Encounter (Signed)
Tyler Kemp returned call and states that CT was completed 08/28/22.

## 2022-08-29 NOTE — Telephone Encounter (Signed)
Left VM to call back 

## 2022-09-04 ENCOUNTER — Telehealth: Payer: Self-pay | Admitting: Cardiology

## 2022-09-04 NOTE — Telephone Encounter (Signed)
Sister called to follow-up on patient's test results. 

## 2022-09-04 NOTE — Telephone Encounter (Signed)
Spoke with Bethena Roys per DPR regarding aorta. Aorta is 4.5 cm and recommend 6 month FU. Pt is established with Dr. Cyndia Bent for same. Bethena Roys advised that pt need to keep log of BP and advise if BP starts increasing. Bethena Roys verbalized understanding and had no additional questions.

## 2022-09-18 ENCOUNTER — Telehealth: Payer: Self-pay | Admitting: Cardiology

## 2022-09-18 NOTE — Telephone Encounter (Signed)
Message left for Bethena Roys to callback.

## 2022-09-18 NOTE — Telephone Encounter (Signed)
New Message:      Sister called and said patient 's blood pressure have been running high. She called for an appointment for patient to be seen. The first available appointment was 10-11-22.   Pt c/o BP issue: STAT if pt c/o blurred vision, one-sided weakness or slurred speech  1. What are your last 5 BP readings? She did not have any of the readings,    2. Are you having any other symptoms (ex. Dizziness, headache, blurred vision, passed out)?  she said no symptoms  3. What is your BP issue?  Blood pressure running high

## 2022-09-18 NOTE — Telephone Encounter (Signed)
Tyler Kemp to keep a log and if BP remains elevated to bring log in sooner than appt. Tyler Kemp verbalized understanding and had no additional questions.

## 2022-10-11 ENCOUNTER — Encounter: Payer: Self-pay | Admitting: Cardiology

## 2022-10-11 ENCOUNTER — Ambulatory Visit: Payer: Medicaid Other | Attending: Cardiology | Admitting: Cardiology

## 2022-10-11 VITALS — BP 150/98 | HR 72 | Ht 67.0 in | Wt 236.6 lb

## 2022-10-11 DIAGNOSIS — I2510 Atherosclerotic heart disease of native coronary artery without angina pectoris: Secondary | ICD-10-CM | POA: Diagnosis not present

## 2022-10-11 DIAGNOSIS — E782 Mixed hyperlipidemia: Secondary | ICD-10-CM | POA: Diagnosis not present

## 2022-10-11 DIAGNOSIS — Z87891 Personal history of nicotine dependence: Secondary | ICD-10-CM | POA: Diagnosis not present

## 2022-10-11 DIAGNOSIS — Z8679 Personal history of other diseases of the circulatory system: Secondary | ICD-10-CM

## 2022-10-11 DIAGNOSIS — Z9889 Other specified postprocedural states: Secondary | ICD-10-CM

## 2022-10-11 MED ORDER — VALSARTAN 160 MG PO TABS: 160.0000 mg | ORAL_TABLET | Freq: Every day | ORAL | 3 refills | Status: DC

## 2022-10-11 NOTE — Patient Instructions (Signed)
Please keep a BP log for 2 weeks and send by MyChart or mail.  Blood Pressure Record Sheet To take your blood pressure, you will need a blood pressure machine. You can buy a blood pressure machine (blood pressure monitor) at your clinic, drug store, or online. When choosing one, consider: An automatic monitor that has an arm cuff. A cuff that wraps snugly around your upper arm. You should be able to fit only one finger between your arm and the cuff. A device that stores blood pressure reading results. Do not choose a monitor that measures your blood pressure from your wrist or finger. Follow your health care provider's instructions for how to take your blood pressure. To use this form: Get one reading in the morning (a.m.) 1-2 hours after you take any medicines. Get one reading in the evening (p.m.) before supper.   Blood pressure log Date: _______________________  a.m. _____________________(1st reading) HR___________            p.m. _____________________(2nd reading) HR__________  Date: _______________________  a.m. _____________________(1st reading) HR___________            p.m. _____________________(2nd reading) HR__________  Date: _______________________  a.m. _____________________(1st reading) HR___________            p.m. _____________________(2nd reading) HR__________  Date: _______________________  a.m. _____________________(1st reading) HR___________            p.m. _____________________(2nd reading) HR__________  Date: _______________________  a.m. _____________________(1st reading) HR___________            p.m. _____________________(2nd reading) HR__________  Date: _______________________  a.m. _____________________(1st reading) HR___________            p.m. _____________________(2nd reading) HR__________  Date: _______________________  a.m. _____________________(1st reading) HR___________            p.m. _____________________(2nd reading)  HR__________   This information is not intended to replace advice given to you by your health care provider. Make sure you discuss any questions you have with your health care provider. Document Revised: 11/19/2019 Document Reviewed: 11/19/2019 Elsevier Patient Education  [place].   Medication Instructions:   Your physician has recommended you make the following change in your medication:   Increase your Valsartan to 160 mg daily. Keep a BP log and bring back in 2 weeks when you get labs.  *If you need a refill on your cardiac medications before your next appointment, please call your pharmacy*   Lab Work: Your physician recommends that you return for lab work in: 2 weeks You need to have labs done when you are fasting.  You can come Monday through Friday 8:30 am to 12:00 pm and 1:15 to 4:30. You do not need to make an appointment as the order has already been placed. The labs you are going to have done are CMP, CBC, TSH and Lipids. \ If you have labs (blood work) drawn today and your tests are completely normal, you will receive your results only by: [place] (if you have MyChart) OR A paper copy in the mail If you have any lab test that is abnormal or we need to change your treatment, we will call you to review the results.   Testing/Procedures: None ordered   Follow-Up: At [facility], you and your health needs are our priority.  As part of our continuing mission to provide you with exceptional heart care, we have created designated Provider Care Teams.  These Care Teams include your primary Cardiologist (physician) and Advanced Practice Providers (  APPs -  Physician Assistants and Nurse Practitioners) who all work together to provide you with the care you need, when you need it.  We recommend signing up for the patient portal called "MyChart".  Sign up information is provided on this After Visit Summary.  MyChart is used to connect with patients for  Virtual Visits (Telemedicine).  Patients are able to view lab/test results, encounter notes, upcoming appointments, etc.  Non-urgent messages can be sent to your provider as well.   To learn more about what you can do with MyChart, go to [email or web address].    Your next appointment:   6 month(s)  The format for your next appointment:   In Person  Provider:   Jyl Heinz, MD    Other Instructions none  Important Information About Sugar

## 2022-10-11 NOTE — Progress Notes (Signed)
Cardiology Office Note:    Date:  10/11/2022   ID:  Tyler Kemp, DOB Mar 25, 1961, MRN [id number]  PCP:  Patient, No Pcp Per  Cardiologist:  Jenean Lindau, MD   Referring MD: No ref. provider found    ASSESSMENT:    1. Coronary artery disease involving native coronary artery of native heart without angina pectoris   2. Former cigarette smoker   3. Mixed dyslipidemia   4. S/P aortic aneurysm repair    PLAN:    In order of problems listed above:  Coronary artery disease: Secondary prevention stressed with the patient.  Importance of compliance with diet medication stressed any vocalized understanding.  He was advised to walk at least half an hour a day 5 days a week and he promises to do so. Essential hypertension: Blood pressure is elevated.  I have doubled his valsartan to 180 mg daily.  Lifestyle modification, salt intake issues were discussed with the patient at length and he vocalized understanding.  He will be back in 2 weeks for fasting blood work.  At that time we will get a log of his blood pressure check. Mixed dyslipidemia: On lipid-lowering medications.  He will have blood work in the next few days and will advise him accordingly. Obesity: Weight reduction stressed and diet emphasized and he promises to do better.  Risks of obesity explained. Prostatic valve replacement and aortic aneurysm repair: Appears to be stable.  CT scan report done in January was reviewed with him and we will follow-up with the CT scan in 6 months and monitor this. Patient will be seen in follow-up appointment in 6 months or earlier if the patient has any concerns    Medication Adjustments/Labs and Tests Ordered: Current medicines are reviewed at length with the patient today.  Concerns regarding medicines are outlined above.  Orders Placed This Encounter  Procedures   CBC   Comprehensive metabolic panel   Lipid panel   TSH   Meds ordered this encounter  Medications   valsartan (DIOVAN)  160 MG tablet    Sig: Take 1 tablet (160 mg total) by mouth daily.    Dispense:  90 tablet    Refill:  3     No chief complaint on file.    History of Present Illness:    Tyler Kemp is a 62 y.o. male.  Has past medical history of coronary artery disease, aortic valve replacement and aortic aneurysm repair.  Subsequently is done fine.  He denies any chest pain orthopnea or PND.  He has brought his blood pressure log today.  At the time of my evaluation, the patient is alert awake oriented and in no distress.  He walks on a regular basis.  Past Medical History:  Diagnosis Date   Choledocholithiasis 12/30/2018   Coronary artery disease    Deep vein thrombosis (DVT) of right upper extremity ([place]) 10/28/2018   Food impaction of esophagus 12/11/2017   Former cigarette smoker 06/15/2016   GERD (gastroesophageal reflux disease)    Hiatal hernia    Hyponatremia 12/30/2018   Mixed dyslipidemia 04/10/2018   Pneumonia    PONV (postoperative nausea and vomiting)    S/P aortic aneurysm repair 03/13/2018   S/P AVR 02/19/2018   Severe aortic stenosis    Thoracic aortic aneurysm [facility])     Past Surgical History:  Procedure Laterality Date   BENTALL PROCEDURE N/A 02/19/2018   Procedure: BENTALL PROCEDURE;  Surgeon: Gaye Pollack, MD;  Location: [place];  Service:  Open Heart Surgery;  Laterality: N/A;  RIGHT AXILLARY CANNULATION  CIRC ARREST  BILATERAL RADIAL ARTERIAL LINES   CHOLECYSTECTOMY     CORONARY ARTERY BYPASS GRAFT N/A 02/19/2018   Procedure: CORONARY ARTERY BYPASS GRAFTING (CABG)x3. LIMA TO LAD. SVG TO PL. SVG to OM. SAPHENOUS VEIN HARVEST.;  Surgeon: Gaye Pollack, MD;  [facility];  Service: Open Heart Surgery;  Laterality: N/A;   ESOPHAGOGASTRODUODENOSCOPY (EGD) WITH PROPOFOL N/A 12/11/2017   Procedure: ESOPHAGOGASTRODUODENOSCOPY (EGD) WITH PROPOFOL;  Surgeon: Gatha Mayer, MD;  Location: [place];  Service: Endoscopy;  Laterality: N/A;   FOREIGN BODY REMOVAL N/A 12/11/2017    Procedure: FOREIGN BODY REMOVAL;  Surgeon: Gatha Mayer, MD;  Location: [place];  Service: Endoscopy;  Laterality: N/A;   MULTIPLE TOOTH EXTRACTIONS     REPLACEMENT ASCENDING AORTA N/A 02/19/2018   Procedure: REPLACEMENT ASCENDING AORTA;  Surgeon: Gaye Pollack, MD;  Location: [place];  Service: Open Heart Surgery;  Laterality: N/A;   RIGHT/LEFT HEART CATH AND CORONARY ANGIOGRAPHY N/A 12/04/2017   Procedure: RIGHT/LEFT HEART CATH AND CORONARY ANGIOGRAPHY;  Surgeon: Belva Crome, MD;  Location: [place] CV LAB;  Service: Cardiovascular;  Laterality: N/A;   TEE WITHOUT CARDIOVERSION N/A 02/19/2018   Procedure: TRANSESOPHAGEAL ECHOCARDIOGRAM (TEE);  Surgeon: Gaye Pollack, MD;  Location: [place];  Service: Open Heart Surgery;  Laterality: N/A;    Current Medications: Current Meds  Medication Sig   aspirin EC 81 MG tablet Take 1 tablet (81 mg total) by mouth daily.   atorvastatin (LIPITOR) 40 MG tablet TAKE 1 TABLET BY MOUTH DAILY AT 6 PM.   metoprolol tartrate (LOPRESSOR) 25 MG tablet Take 1 tablet (25 mg total) by mouth 2 (two) times daily.   [DISCONTINUED] valsartan (DIOVAN) 80 MG tablet Take 1 tablet (80 mg total) by mouth daily.     Allergies:   Patient has no known allergies.   Social History   Socioeconomic History   Marital status: Single    Spouse name: Not on file   Number of children: Not on file   Years of education: Not on file   Highest education level: Not on file  Occupational History   Not on file  Tobacco Use   Smoking status: Former    Types: Cigarettes    Quit date: 09/14/2017    Years since quitting: 5.0   Smokeless tobacco: Never  Vaping Use   Vaping Use: Never used  Substance and Sexual Activity   Alcohol use: Yes    Comment: occasional on weekends   Drug use: Never   Sexual activity: Not on file  Other Topics Concern   Not on file  Social History Narrative   Not on file   Social Determinants of Health   Financial Resource Strain: Not on  file  Food Insecurity: Not on file  Transportation Needs: Not on file  Physical Activity: Not on file  Stress: Not on file  Social Connections: Not on file     Family History: The patient's family history includes Diabetes in his mother and sister; Heart disease in his father and sister; Stroke in his father; Throat cancer in his father.  ROS:   Please see the history of present illness.    All other systems reviewed and are negative.  EKGs/Labs/Other Studies Reviewed:    The following studies were reviewed today: I discussed my findings with the patient at length.   Recent Labs: 07/17/2022: BUN 13; Creatinine, Ser 0.89; Potassium 4.4; Sodium  137  Recent Lipid Panel    Component Value Date/Time   CHOL 157 06/10/2021 0859   TRIG 94 06/10/2021 0859   HDL 56 06/10/2021 0859   CHOLHDL 2.8 06/10/2021 0859   LDLCALC 84 06/10/2021 0859    Physical Exam:    VS:  BP (!) 150/98   Pulse 72   Ht '5\' 7"'$  (1.702 m)   Wt 236 lb 9.6 oz (107.3 kg)   SpO2 97%   BMI 37.06 kg/m     Wt Readings from Last 3 Encounters:  10/11/22 236 lb 9.6 oz (107.3 kg)  07/10/22 230 lb 9.6 oz (104.6 kg)  06/10/21 231 lb 3.2 oz (104.9 kg)     GEN: Patient is in no acute distress HEENT: Normal NECK: No JVD; No carotid bruits LYMPHATICS: No lymphadenopathy CARDIAC: Hear sounds regular, 2/6 systolic murmur at the apex. RESPIRATORY:  Clear to auscultation without rales, wheezing or rhonchi  ABDOMEN: Soft, non-tender, non-distended MUSCULOSKELETAL:  No edema; No deformity  SKIN: Warm and dry NEUROLOGIC:  Alert and oriented x 3 PSYCHIATRIC:  Normal affect   Signed, Jenean Lindau, MD  10/11/2022 3:29 PM    [place]

## 2022-10-26 LAB — CBC
Hematocrit: 43.7 % (ref 37.5–51.0)
Hemoglobin: 14.1 g/dL (ref 13.0–17.7)
MCH: 28.3 pg (ref 26.6–33.0)
MCHC: 32.3 g/dL (ref 31.5–35.7)
MCV: 88 fL (ref 79–97)
Platelets: 168 10*3/uL (ref 150–450)
RBC: 4.99 x10E6/uL (ref 4.14–5.80)
RDW: 13.2 % (ref 11.6–15.4)
WBC: 6.2 10*3/uL (ref 3.4–10.8)

## 2022-10-26 LAB — COMPREHENSIVE METABOLIC PANEL
ALT: 20 IU/L (ref 0–44)
AST: 18 IU/L (ref 0–40)
Albumin/Globulin Ratio: 2 (ref 1.2–2.2)
Albumin: 4.3 g/dL (ref 3.9–4.9)
Alkaline Phosphatase: 82 IU/L (ref 44–121)
BUN/Creatinine Ratio: 14 (ref 10–24)
BUN: 12 mg/dL (ref 8–27)
Bilirubin Total: 0.5 mg/dL (ref 0.0–1.2)
CO2: 24 mmol/L (ref 20–29)
Calcium: 9.2 mg/dL (ref 8.6–10.2)
Chloride: 104 mmol/L (ref 96–106)
Creatinine, Ser: 0.87 mg/dL (ref 0.76–1.27)
Globulin, Total: 2.1 g/dL (ref 1.5–4.5)
Glucose: 88 mg/dL (ref 70–99)
Potassium: 4.4 mmol/L (ref 3.5–5.2)
Sodium: 140 mmol/L (ref 134–144)
Total Protein: 6.4 g/dL (ref 6.0–8.5)
eGFR: 97 mL/min/{1.73_m2} (ref >59)

## 2022-10-26 LAB — LIPID PANEL
Chol/HDL Ratio: 2.9 ratio (ref 0.0–5.0)
Cholesterol, Total: 140 mg/dL (ref 100–199)
HDL: 48 mg/dL (ref >39)
LDL Chol Calc (NIH): 73 mg/dL (ref 0–99)
Triglycerides: 105 mg/dL (ref 0–149)
VLDL Cholesterol Cal: 19 mg/dL (ref 5–40)

## 2022-10-26 LAB — TSH: TSH: 2.11 u[IU]/mL (ref 0.450–4.500)

## 2022-10-31 ENCOUNTER — Telehealth: Payer: Self-pay

## 2022-10-31 NOTE — Telephone Encounter (Signed)
-----   Message from Jenean Lindau, MD sent at 10/29/2022  5:42 PM EDT ----- The results of the study is unremarkable. Please inform patient. I will discuss in detail at next appointment. Cc  primary care/referring physician Jenean Lindau, MD 10/29/2022 5:42 PM

## 2023-02-01 ENCOUNTER — Other Ambulatory Visit: Payer: Self-pay | Admitting: Cardiology

## 2023-04-17 ENCOUNTER — Encounter: Payer: Self-pay | Admitting: Cardiology

## 2023-04-17 ENCOUNTER — Ambulatory Visit: Payer: Medicaid Other | Attending: Cardiology | Admitting: Cardiology

## 2023-04-17 VITALS — BP 110/68 | HR 72 | Ht 68.0 in | Wt 232.4 lb

## 2023-04-17 DIAGNOSIS — Z87891 Personal history of nicotine dependence: Secondary | ICD-10-CM | POA: Diagnosis not present

## 2023-04-17 DIAGNOSIS — I712 Thoracic aortic aneurysm, without rupture, unspecified: Secondary | ICD-10-CM

## 2023-04-17 DIAGNOSIS — Z9889 Other specified postprocedural states: Secondary | ICD-10-CM | POA: Diagnosis not present

## 2023-04-17 DIAGNOSIS — E782 Mixed hyperlipidemia: Secondary | ICD-10-CM

## 2023-04-17 DIAGNOSIS — I2510 Atherosclerotic heart disease of native coronary artery without angina pectoris: Secondary | ICD-10-CM | POA: Diagnosis not present

## 2023-04-17 DIAGNOSIS — E669 Obesity, unspecified: Secondary | ICD-10-CM | POA: Insufficient documentation

## 2023-04-17 DIAGNOSIS — Z952 Presence of prosthetic heart valve: Secondary | ICD-10-CM

## 2023-04-17 DIAGNOSIS — Z8679 Personal history of other diseases of the circulatory system: Secondary | ICD-10-CM

## 2023-04-17 MED ORDER — HYDROCHLOROTHIAZIDE 12.5 MG PO CAPS
12.5000 mg | ORAL_CAPSULE | Freq: Every day | ORAL | 3 refills | Status: DC
Start: 2023-04-17 — End: 2023-04-17

## 2023-04-17 NOTE — Progress Notes (Signed)
Cardiology Office Note:    Date:  04/17/2023   ID:  Tyler Kemp, DOB 1960-09-21, MRN [id number]  PCP:  Kirt Boys, FNP  Cardiologist:  Garwin Brothers, MD   Referring MD: No ref. provider found    ASSESSMENT:    1. Coronary artery disease involving native coronary artery of native heart without angina pectoris   2. Thoracic aortic aneurysm without rupture, unspecified part (HCC)   3. Former cigarette smoker   4. S/P aortic aneurysm repair   5. S/P AVR   6. Mixed dyslipidemia   7. Obesity (BMI 35.0-39.9 without comorbidity)    PLAN:    In order of problems listed above:  Coronary artery disease: Secondary prevention stressed with the patient.  Importance of compliance with diet medication stressed any vocalized understanding.  He was advised to walk at least half an hour a day 5 days a week and he promises to do so. Ascending aortic aneurysm: Post ascending aortic repair: Stable and asymptomatic.  Discussed symptoms of rupture.  He will have a follow-up CT scan in the next few days. Essential hypertension: Blood pressure is elevated.  Salt intake and diet issues were discussed exercise and lifestyle modification urged I have added hydrochlorothiazide 12.5 mg to her regimen.  He will keep a track of his pulse blood pressure and get it back to [place] in 2 weeks we will do a Chem-7 at that time. Mixed dyslipidemia: On lipid-lowering medications lipids reviewed from [facility] sheet and found to be fine. Obesity: Weight reduction stressed diet emphasized and risks of obesity explained.  Patient promises to do better. Patient will be seen in follow-up appointment in 6 months or earlier if the patient has any concerns.    Medication Adjustments/Labs and Tests Ordered: Current medicines are reviewed at length with the patient today.  Concerns regarding medicines are outlined above.  Orders Placed This Encounter  Procedures   CT CHEST WO CONTRAST   Basic metabolic panel   Meds  ordered this encounter  Medications   hydrochlorothiazide (MICROZIDE) 12.5 MG capsule    Sig: Take 1 capsule (12.5 mg total) by mouth daily.    Dispense:  90 capsule    Refill:  3     No chief complaint on file.    History of Present Illness:    Tyler Kemp is a 62 y.o. male.  Patient has past medical history of essential tension, dyslipidemia, aortic valve replacement post aortic aneurysm repair.  He denies any problems at this time and takes care of activities of daily living.  No chest pain orthopnea or PND.  Overall he leads a sedentary lifestyle.  At the time of my evaluation, the patient is alert awake oriented and in no distress.  Past Medical History:  Diagnosis Date   Choledocholithiasis 12/30/2018   Coronary artery disease    Deep vein thrombosis (DVT) of right upper extremity (HCC) 10/28/2018   Food impaction of esophagus 12/11/2017   Former cigarette smoker 06/15/2016   GERD (gastroesophageal reflux disease)    Hiatal hernia    Hyponatremia 12/30/2018   Mixed dyslipidemia 04/10/2018   Pneumonia    PONV (postoperative nausea and vomiting)    S/P aortic aneurysm repair 03/13/2018   S/P AVR 02/19/2018   Severe aortic stenosis    Thoracic aortic aneurysm [facility])     Past Surgical History:  Procedure Laterality Date   BENTALL PROCEDURE N/A 02/19/2018   Procedure: BENTALL PROCEDURE;  Surgeon: Alleen Borne, MD;  [facility]  OR;  Service: Open Heart Surgery;  Laterality: N/A;  RIGHT AXILLARY CANNULATION  CIRC ARREST  BILATERAL RADIAL ARTERIAL LINES   CHOLECYSTECTOMY     CORONARY ARTERY BYPASS GRAFT N/A 02/19/2018   Procedure: CORONARY ARTERY BYPASS GRAFTING (CABG)x3. LIMA TO LAD. SVG TO PL. SVG to OM. SAPHENOUS VEIN HARVEST.;  Surgeon: Alleen Borne, MD;  [facility];  Service: Open Heart Surgery;  Laterality: N/A;   ESOPHAGOGASTRODUODENOSCOPY (EGD) WITH PROPOFOL N/A 12/11/2017   Procedure: ESOPHAGOGASTRODUODENOSCOPY (EGD) WITH PROPOFOL;  Surgeon: Iva Boop, MD;  Location: [facility] ENDOSCOPY;  Service: Endoscopy;  Laterality: N/A;   FOREIGN BODY REMOVAL N/A 12/11/2017   Procedure: FOREIGN BODY REMOVAL;  Surgeon: Iva Boop, MD;  Location: [facility] ENDOSCOPY;  Service: Endoscopy;  Laterality: N/A;   MULTIPLE TOOTH EXTRACTIONS     REPLACEMENT ASCENDING AORTA N/A 02/19/2018   Procedure: REPLACEMENT ASCENDING AORTA;  Surgeon: Alleen Borne, MD;  [facility];  Service: Open Heart Surgery;  Laterality: N/A;   RIGHT/LEFT HEART CATH AND CORONARY ANGIOGRAPHY N/A 12/04/2017   Procedure: RIGHT/LEFT HEART CATH AND CORONARY ANGIOGRAPHY;  Surgeon: Lyn Records, MD;  [facility] INV[facility];  Service: Cardiovascular;  Laterality: N/A;   TEE WITHOUT CARDIOVERSION N/A 02/19/2018   Procedure: TRANSESOPHAGEAL ECHOCARDIOGRAM (TEE);  Surgeon: Alleen Borne, MD;  Location: [facility] OR;  Service: Open Heart Surgery;  Laterality: N/A;    Current Medications: Current Meds  Medication Sig   aspirin EC 81 MG tablet Take 1 tablet (81 mg total) by mouth daily.   atorvastatin (LIPITOR) 40 MG tablet Take 1 tablet (40 mg total) by mouth daily.   clobetasol ointment (TEMOVATE) 0.05 % Apply 1 Application topically 2 (two) times daily.   hydrochlorothiazide (MICROZIDE) 12.5 MG capsule Take 1 capsule (12.5 mg total) by mouth daily.   metoprolol tartrate (LOPRESSOR) 25 MG tablet Take 1 tablet (25 mg total) by mouth 2 (two) times daily.   pantoprazole (PROTONIX) 40 MG tablet Take 40 mg by mouth daily.   valsartan (DIOVAN) 160 MG tablet Take 1 tablet (160 mg total) by mouth daily.     Allergies:   Patient has no known allergies.   Social History   Socioeconomic History   Marital status: Single    Spouse name: Not on file   Number of children: Not on file   Years of education: Not on file   Highest education level: Not on file  Occupational History   Not on file  Tobacco Use   Smoking status: Former    Current packs/day: 0.00    Types: Cigarettes    Quit date:  09/14/2017    Years since quitting: 5.5   Smokeless tobacco: Never  Vaping Use   Vaping status: Never Used  Substance and Sexual Activity   Alcohol use: Yes    Comment: occasional on weekends   Drug use: Never   Sexual activity: Not on file  Other Topics Concern   Not on file  Social History Narrative   Not on file   Social Determinants of Health   Financial Resource Strain: Low Risk  (12/31/2018)   Received from [facility], [facility]   Overall Financial Resource Strain (CARDIA)    Difficulty of Paying Living Expenses: Not very hard  Food Insecurity: No Food Insecurity (03/27/2023)   Received from [facility]   Hunger Vital Sign    Worried About Running Out of Food in the Last Year: Never true  Ran Out of Food in the Last Year: Never true  Transportation Needs: No Transportation Needs (03/27/2023)   Received from [facility] - Transportation    Lack of Transportation (Medical): No    Lack of Transportation (Non-Medical): No  Physical Activity: Sufficiently Active (12/31/2018)   [facility]   Exercise Vital Sign    Days of Exercise per Week: 5 days    Minutes of Exercise per Session: 30 min  Stress: Stress Concern Present (12/31/2018)   Received from [facility], [facility] of Occupational Health - Occupational Stress Questionnaire    Feeling of Stress : To some extent  Social Connections: Socially Isolated (12/31/2018)   Received from [facility], [facility]   Social Connection and Isolation Panel [NHANES]    Frequency of Communication with Friends and Family: Twice a week    Frequency of Social Gatherings with Friends and Family: Never    Attends Religious Services: 1 to 4 times per year    Active Member of [employer] or Organizations: No    Attends Banker Meetings: Never    Marital Status: Widowed     Family History: The patient's family history includes  Diabetes in his mother and sister; Heart disease in his father and sister; Stroke in his father; Throat cancer in his father.  ROS:   Please see the history of present illness.    All other systems reviewed and are negative.  EKGs/Labs/Other Studies Reviewed:    The following studies were reviewed today: I discussed my findings with the patient at length.   Recent Labs: 10/25/2022: ALT 20; BUN 12; Creatinine, Ser 0.87; Hemoglobin 14.1; Platelets 168; Potassium 4.4; Sodium 140; TSH 2.110  Recent Lipid Panel    Component Value Date/Time   CHOL 140 10/25/2022 0819   TRIG 105 10/25/2022 0819   HDL 48 10/25/2022 0819   CHOLHDL 2.9 10/25/2022 0819   LDLCALC 73 10/25/2022 0819    Physical Exam:    VS:  BP (!) 144/92   Pulse 72   Ht 5\' 8"  (1.727 m)   Wt 232 lb 6.4 oz (105.4 kg)   SpO2 95%   BMI 35.34 kg/m     Wt Readings from Last 3 Encounters:  04/17/23 232 lb 6.4 oz (105.4 kg)  10/11/22 236 lb 9.6 oz (107.3 kg)  07/10/22 230 lb 9.6 oz (104.6 kg)     GEN: Patient is in no acute distress HEENT: Normal NECK: No JVD; No carotid bruits LYMPHATICS: No lymphadenopathy CARDIAC: Hear sounds regular, 2/6 systolic murmur at the apex. RESPIRATORY:  Clear to auscultation without rales, wheezing or rhonchi  ABDOMEN: Soft, non-tender, non-distended MUSCULOSKELETAL:  No edema; No deformity  SKIN: Warm and dry NEUROLOGIC:  Alert and oriented x 3 PSYCHIATRIC:  Normal affect   Signed, Garwin Brothers, MD  04/17/2023 1:31 PM    [facility]

## 2023-04-17 NOTE — Patient Instructions (Signed)
Please keep a BP log for 2 weeks and send by MyChart or mail.  Blood Pressure Record Sheet To take your blood pressure, you will need a blood pressure machine. You can buy a blood pressure machine (blood pressure monitor) at your clinic, drug store, or online. When choosing one, consider: An automatic monitor that has an arm cuff. A cuff that wraps snugly around your upper arm. You should be able to fit only one finger between your arm and the cuff. A device that stores blood pressure reading results. Do not choose a monitor that measures your blood pressure from your wrist or finger. Follow your health care provider's instructions for how to take your blood pressure. To use this form: Get one reading in the morning (a.m.) 1-2 hours after you take any medicines. Get one reading in the evening (p.m.) before supper.   Blood pressure log Date: _______________________  a.m. _____________________(1st reading) HR___________            p.m. _____________________(2nd reading) HR__________  Date: _______________________  a.m. _____________________(1st reading) HR___________            p.m. _____________________(2nd reading) HR__________  Date: _______________________  a.m. _____________________(1st reading) HR___________            p.m. _____________________(2nd reading) HR__________  Date: _______________________  a.m. _____________________(1st reading) HR___________            p.m. _____________________(2nd reading) HR__________  Date: _______________________  a.m. _____________________(1st reading) HR___________            p.m. _____________________(2nd reading) HR__________  Date: _______________________  a.m. _____________________(1st reading) HR___________            p.m. _____________________(2nd reading) HR__________  Date: _______________________  a.m. _____________________(1st reading) HR___________            p.m. _____________________(2nd reading)  HR__________   This information is not intended to replace advice given to you by your health care provider. Make sure you discuss any questions you have with your health care provider. Document Revised: 11/19/2019 Document Reviewed: 11/19/2019 Elsevier Patient Education  2021 Elsevier Inc.   Medication Instructions:  Your physician has recommended you make the following change in your medication:   Start Hydrochlorothiazide 12.5 mg daily  *If you need a refill on your cardiac medications before your next appointment, please call your pharmacy*   Lab Work: Your physician recommends that you return for lab work in: 1 month for BMP  You can come Monday through Friday 8:30 am to 12:00 pm and 1:15 to 4:30. You do not need to make an appointment as the order has already been placed.  If you have labs (blood work) drawn today and your tests are completely normal, you will receive your results only by: MyChart Message (if you have MyChart) OR A paper copy in the mail If you have any lab test that is abnormal or we need to change your treatment, we will call you to review the results.   Testing/Procedures: Non-Cardiac CT scanning, (CAT scanning), is a noninvasive, special x-ray that produces cross-sectional images of the body using x-rays and a computer. CT scans help physicians diagnose and treat medical conditions. For some CT exams, a contrast material is used to enhance visibility in the area of the body being studied. CT scans provide greater clarity and reveal more details than regular x-ray exams.    Follow-Up: At [facility], you and your health needs are our priority.  As part of our continuing mission to provide you  with exceptional heart care, we have created designated Provider Care Teams.  These Care Teams include your primary Cardiologist (physician) and Advanced Practice Providers (APPs -  Physician Assistants and Nurse Practitioners) who all work together to provide  you with the care you need, when you need it.  We recommend signing up for the patient portal called "MyChart".  Sign up information is provided on this After Visit Summary.  MyChart is used to connect with patients for Virtual Visits (Telemedicine).  Patients are able to view lab/test results, encounter notes, upcoming appointments, etc.  Non-urgent messages can be sent to your provider as well.   To learn more about what you can do with MyChart, go to [email or web address].    Your next appointment:   9 month(s)  The format for your next appointment:   In Person  Provider:   Belva Crome, MD    Other Instructions none  Important Information About Sugar

## 2023-05-17 ENCOUNTER — Encounter: Payer: Self-pay | Admitting: Cardiology

## 2023-05-18 LAB — BASIC METABOLIC PANEL
BUN/Creatinine Ratio: 15 (ref 10–24)
BUN: 14 mg/dL (ref 8–27)
CO2: 23 mmol/L (ref 20–29)
Calcium: 9.3 mg/dL (ref 8.6–10.2)
Chloride: 101 mmol/L (ref 96–106)
Creatinine, Ser: 0.94 mg/dL (ref 0.76–1.27)
Glucose: 76 mg/dL (ref 70–99)
Potassium: 4.3 mmol/L (ref 3.5–5.2)
Sodium: 137 mmol/L (ref 134–144)
eGFR: 91 mL/min/{1.73_m2} (ref >59)

## 2023-05-21 ENCOUNTER — Telehealth: Payer: Self-pay

## 2023-05-21 NOTE — Telephone Encounter (Signed)
Left vm for Darel Hong to return call for results.  Chest CT showed stable aneurysm is stable at 4.4 cm and annual scan.  Scattered pulmonary nodules noted, best to see PCP.

## 2023-05-22 NOTE — Telephone Encounter (Signed)
Results reviewed with Darel Hong per DPR as per Dr. Kem Parkinson note. Darel Hong verbalized understanding and had no additional questions. Routed to PCP.

## 2023-06-25 ENCOUNTER — Other Ambulatory Visit: Payer: Self-pay | Admitting: Cardiology

## 2023-08-22 ENCOUNTER — Other Ambulatory Visit: Payer: Self-pay | Admitting: Cardiology

## 2023-11-18 ENCOUNTER — Other Ambulatory Visit: Payer: Self-pay | Admitting: Cardiology

## 2024-02-20 ENCOUNTER — Other Ambulatory Visit: Payer: Self-pay | Admitting: Cardiology

## 2024-03-14 ENCOUNTER — Other Ambulatory Visit: Payer: Self-pay | Admitting: Cardiology

## 2024-04-07 ENCOUNTER — Other Ambulatory Visit: Payer: Self-pay | Admitting: Cardiology

## 2024-05-10 ENCOUNTER — Other Ambulatory Visit: Payer: Self-pay | Admitting: Cardiology

## 2024-05-22 ENCOUNTER — Other Ambulatory Visit: Payer: Self-pay | Admitting: Cardiology

## 2024-06-16 ENCOUNTER — Other Ambulatory Visit: Payer: Self-pay | Admitting: Cardiology

## 2024-06-27 ENCOUNTER — Other Ambulatory Visit: Payer: Self-pay | Admitting: Cardiology

## 2024-06-29 ENCOUNTER — Other Ambulatory Visit: Payer: Self-pay | Admitting: Cardiology

## 2024-07-05 ENCOUNTER — Other Ambulatory Visit: Payer: Self-pay | Admitting: Cardiology

## 2024-07-08 ENCOUNTER — Other Ambulatory Visit: Payer: Self-pay | Admitting: Cardiology

## 2024-07-12 ENCOUNTER — Other Ambulatory Visit: Payer: Self-pay | Admitting: Cardiology

## 2024-07-17 ENCOUNTER — Telehealth: Payer: Self-pay | Admitting: Cardiology

## 2024-07-17 MED ORDER — ATORVASTATIN CALCIUM 40 MG PO TABS: 40.0000 mg | ORAL_TABLET | Freq: Every day | ORAL | 0 refills | Status: AC

## 2024-07-17 MED ORDER — VALSARTAN 160 MG PO TABS: 160.0000 mg | ORAL_TABLET | Freq: Every day | ORAL | 0 refills | Status: AC

## 2024-07-17 MED ORDER — METOPROLOL TARTRATE 25 MG PO TABS: 25.0000 mg | ORAL_TABLET | Freq: Two times a day (BID) | ORAL | 0 refills | Status: DC

## 2024-07-17 NOTE — Telephone Encounter (Signed)
*  STAT* If patient is at the pharmacy, call can be transferred to refill team.   1. Which medications need to be refilled? (please list name of each medication and dose if known) atorvastatin  (LIPITOR) 40 MG tablet , metoprolol  tartrate (LOPRESSOR ) 25 MG tablet , valsartan  (DIOVAN ) 160 MG tablet    2. Would you like to learn more about the convenience, safety, & potential cost savings by using the [facility]? NO     3. Are you open to using the [facility] NO   4. Which pharmacy/location (including street and city if local pharmacy) is medication to be sent to?  [id number] - [place], [place] - [address]     5. Do they need a 30 day or 90 day supply? 90  Patient scheduled to see Dr. Edwyna on 08/05/24 at 2:20PM

## 2024-07-17 NOTE — Telephone Encounter (Signed)
 Requested Prescriptions   Signed Prescriptions Disp Refills   atorvastatin  (LIPITOR) 40 MG tablet 90 tablet 0    Sig: Take 1 tablet (40 mg total) by mouth daily.    Authorizing Provider: REVANKAR, RAJAN R    Ordering User: Oslo Huntsman  C   metoprolol  tartrate (LOPRESSOR ) 25 MG tablet 90 tablet 0    Sig: Take 1 tablet (25 mg total) by mouth 2 (two) times daily.    Authorizing Provider: REVANKAR, RAJAN R    Ordering User: Garik Diamant  C   valsartan  (DIOVAN ) 160 MG tablet 90 tablet 0    Sig: Take 1 tablet (160 mg total) by mouth daily.    Authorizing Provider: REVANKAR, RAJAN R    Ordering User: WILFRED, Jaydien Panepinto  C

## 2024-08-05 ENCOUNTER — Encounter: Payer: Self-pay | Admitting: Cardiology

## 2024-08-05 ENCOUNTER — Ambulatory Visit: Attending: Cardiology | Admitting: Cardiology

## 2024-08-05 VITALS — BP 128/88 | HR 82 | Ht 68.0 in | Wt 239.5 lb

## 2024-08-05 DIAGNOSIS — E782 Mixed hyperlipidemia: Secondary | ICD-10-CM | POA: Diagnosis not present

## 2024-08-05 DIAGNOSIS — I10 Essential (primary) hypertension: Secondary | ICD-10-CM | POA: Diagnosis not present

## 2024-08-05 DIAGNOSIS — Z8679 Personal history of other diseases of the circulatory system: Secondary | ICD-10-CM | POA: Diagnosis not present

## 2024-08-05 DIAGNOSIS — I2510 Atherosclerotic heart disease of native coronary artery without angina pectoris: Secondary | ICD-10-CM | POA: Diagnosis not present

## 2024-08-05 DIAGNOSIS — Z952 Presence of prosthetic heart valve: Secondary | ICD-10-CM

## 2024-08-05 DIAGNOSIS — I712 Thoracic aortic aneurysm, without rupture, unspecified: Secondary | ICD-10-CM

## 2024-08-05 DIAGNOSIS — I350 Nonrheumatic aortic (valve) stenosis: Secondary | ICD-10-CM

## 2024-08-05 DIAGNOSIS — E669 Obesity, unspecified: Secondary | ICD-10-CM

## 2024-08-05 DIAGNOSIS — Z9889 Other specified postprocedural states: Secondary | ICD-10-CM

## 2024-08-05 NOTE — Patient Instructions (Addendum)
 Medication Instructions:  Your physician recommends that you continue on your current medications as directed. Please refer to the Current Medication list given to you today.  *If you need a refill on your cardiac medications before your next appointment, please call your pharmacy*   Lab Work: Your physician recommends that you return for lab work in: the next few days for fasting lipids and CMP You need to have labs done when you are fasting.  You can come Monday through Friday 8:30 am to 12:00 pm and 1:15 to 4:30. You do not need to make an appointment as the order has already been placed.   If you have labs (blood work) drawn today and your tests are completely normal, you will receive your results only by: MyChart Message (if you have MyChart) OR A paper copy in the mail If you have any lab test that is abnormal or we need to change your treatment, we will call you to review the results.   Testing/Procedures: Non-Cardiac CT scanning, (CAT scanning), is a noninvasive, special x-ray that produces cross-sectional images of the body using x-rays and a computer. CT scans help physicians diagnose and treat medical conditions. For some CT exams, a contrast material is used to enhance visibility in the area of the body being studied. CT scans provide greater clarity and reveal more details than regular x-ray exams.   Your physician has requested that you have an echocardiogram. Echocardiography is a painless test that uses sound waves to create images of your heart. It provides your doctor with information about the size and shape of your heart and how well your hearts chambers and valves are working. This procedure takes approximately one hour. There are no restrictions for this procedure. Please do NOT wear cologne, perfume, aftershave, or lotions (deodorant is allowed). Please arrive 15 minutes prior to your appointment time.  Please note: We ask at that you not bring children with you during  ultrasound (echo/ vascular) testing. Due to room size and safety concerns, children are not allowed in the ultrasound rooms during exams. Our front office staff cannot provide observation of children in our lobby area while testing is being conducted. An adult accompanying a patient to their appointment will only be allowed in the ultrasound room at the discretion of the ultrasound technician under special circumstances. We apologize for any inconvenience.  Follow-Up: At [facility], you and your health needs are our priority.  As part of our continuing mission to provide you with exceptional heart care, we have created designated Provider Care Teams.  These Care Teams include your primary Cardiologist (physician) and Advanced Practice Providers (APPs -  Physician Assistants and Nurse Practitioners) who all work together to provide you with the care you need, when you need it.  We recommend signing up for the patient portal called MyChart.  Sign up information is provided on this After Visit Summary.  MyChart is used to connect with patients for Virtual Visits (Telemedicine).  Patients are able to view lab/test results, encounter notes, upcoming appointments, etc.  Non-urgent messages can be sent to your provider as well.   To learn more about what you can do with MyChart, go to [email or web address].    Your next appointment:   6 month(s)  The format for your next appointment:   In Person  Provider:   Jennifer Crape, MD    Other Instructions none  Important Information About Sugar

## 2024-08-05 NOTE — Progress Notes (Signed)
 " Cardiology Office Note:    Date:  08/05/2024   ID:  Tyler Kemp, DOB [birth year], MRN [id number]  PCP:  Felicie Powell Chol, FNP  Cardiologist:  Jennifer JONELLE Crape, MD   Referring MD: Felicie Powell Chol, FNP    ASSESSMENT:    1. Mixed dyslipidemia   2. Thoracic aortic aneurysm without rupture, unspecified part   3. S/P AVR   4. S/P aortic aneurysm repair   5. Obesity (BMI 35.0-39.9 without comorbidity)   6. Essential hypertension    PLAN:    In order of problems listed above:  Coronary artery disease: Secondary prevention stressed with the patient.  Importance of compliance with diet medication stressed and patient verbalized standing.  He was advised to walk at least half an hour a day on a daily basis. Essential hypertension: Blood pressure is stable and diet was emphasized.  Lifestyle modification urged.  Salt intake issues discussed.  He tells me that his blood pressure is elevated at home and in the range of 130/70. Ascending aortic aneurysm postrepair: Will do a CT scan to assess this. Aortic valve replacement: Stable.  Will get an echo for follow-up. Mixed dyslipidemia will need lipid evaluation.  Will send him for blood work.  Goal LDL less than 60.\ Obesity: Weight reduction stressed diet emphasized and risks of obesity explained.  He promises to do better. Patient will be seen in follow-up appointment in 6 months or earlier if the patient has any concerns.    Medication Adjustments/Labs and Tests Ordered: Current medicines are reviewed at length with the patient today.  Concerns regarding medicines are outlined above.  Orders Placed This Encounter  Procedures   EKG 12-Lead   No orders of the defined types were placed in this encounter.    No chief complaint on file.    History of Present Illness:    Tyler Kemp is a 63 y.o. male.  Patient has past medical history of aortic aneurysm repair, aortic valve replacement, essential hypertension, mixed  dyslipidemia and coronary artery disease.  He denies any problems at this time and takes care of activities of daily living.  No chest pain orthopnea or PND.  He leads a sedentary lifestyle.  At the time of my evaluation, the patient is alert awake oriented and in no distress.  Past Medical History:  Diagnosis Date   Choledocholithiasis 12/30/2018   Coronary artery disease    Deep vein thrombosis (DVT) of right upper extremity (HCC) 10/28/2018   Essential hypertension 08/05/2024   Food impaction of esophagus 12/11/2017   Former cigarette smoker 06/15/2016   GERD (gastroesophageal reflux disease)    Hiatal hernia    Hyponatremia 12/30/2018   Mixed dyslipidemia 04/10/2018   Obesity (BMI 35.0-39.9 without comorbidity) 04/17/2023   Pneumonia    PONV (postoperative nausea and vomiting)    S/P aortic aneurysm repair 03/13/2018   S/P AVR 02/19/2018   Severe aortic stenosis    Thoracic aortic aneurysm     Past Surgical History:  Procedure Laterality Date   BENTALL PROCEDURE N/A 02/19/2018   Procedure: BENTALL PROCEDURE;  Surgeon: Lucas Dorise POUR, MD;  Location: [facility] OR;  Service: Open Heart Surgery;  Laterality: N/A;  RIGHT AXILLARY CANNULATION  CIRC ARREST  BILATERAL RADIAL ARTERIAL LINES   CHOLECYSTECTOMY     CORONARY ARTERY BYPASS GRAFT N/A 02/19/2018   Procedure: CORONARY ARTERY BYPASS GRAFTING (CABG)x3. LIMA TO LAD. SVG TO PL. SVG to OM. SAPHENOUS VEIN HARVEST.;  Surgeon: Lucas Dorise POUR, MD;  Location: [facility]  OR;  Service: Open Heart Surgery;  Laterality: N/A;   ESOPHAGOGASTRODUODENOSCOPY (EGD) WITH PROPOFOL  N/A 12/11/2017   Procedure: ESOPHAGOGASTRODUODENOSCOPY (EGD) WITH PROPOFOL ;  Surgeon: Avram Lupita BRAVO, MD;  Location: [facility] ENDOSCOPY;  Service: Endoscopy;  Laterality: N/A;   FOREIGN BODY REMOVAL N/A 12/11/2017   Procedure: FOREIGN BODY REMOVAL;  Surgeon: Avram Lupita BRAVO, MD;  Location: [facility] ENDOSCOPY;  Service: Endoscopy;  Laterality: N/A;   MULTIPLE TOOTH EXTRACTIONS     REPLACEMENT  ASCENDING AORTA N/A 02/19/2018   Procedure: REPLACEMENT ASCENDING AORTA;  Surgeon: Lucas Dorise POUR, MD;  Location: [facility] OR;  Service: Open Heart Surgery;  Laterality: N/A;   RIGHT/LEFT HEART CATH AND CORONARY ANGIOGRAPHY N/A 12/04/2017   Procedure: RIGHT/LEFT HEART CATH AND CORONARY ANGIOGRAPHY;  Surgeon: Claudene Victory ORN, MD;  Location: [facility] INVASIVE CV LAB;  Service: Cardiovascular;  Laterality: N/A;   TEE WITHOUT CARDIOVERSION N/A 02/19/2018   Procedure: TRANSESOPHAGEAL ECHOCARDIOGRAM (TEE);  Surgeon: Lucas Dorise POUR, MD;  Location: [facility] OR;  Service: Open Heart Surgery;  Laterality: N/A;    Current Medications: Active Medications[1]   Allergies:   Patient has no known allergies.   Social History   Socioeconomic History   Marital status: Single    Spouse name: Not on file   Number of children: Not on file   Years of education: Not on file   Highest education level: Not on file  Occupational History   Not on file  Tobacco Use   Smoking status: Former    Current packs/day: 0.00    Types: Cigarettes    Quit date: 09/14/2017    Years since quitting: 6.8   Smokeless tobacco: Never  Vaping Use   Vaping status: Never Used  Substance and Sexual Activity   Alcohol use: Yes    Comment: occasional on weekends   Drug use: Never   Sexual activity: Not on file  Other Topics Concern   Not on file  Social History Narrative   Not on file   Social Drivers of Health   Tobacco Use: Medium Risk (08/05/2024)   Patient History    Smoking Tobacco Use: Former    Smokeless Tobacco Use: Never    Passive Exposure: Not on Actuary Strain: Not on file  Food Insecurity: No Food Insecurity (07/31/2024)   Received from [facility]   Epic    Within the past 12 months, you worried that your food would run out before you got the money to buy more.: Never true    Within the past 12 months, the food you bought just didn't last and you didn't have money to get more.: Never true   Transportation Needs: No Transportation Needs (07/31/2024)   Received from [facility]   PRAPARE - Transportation    Lack of Transportation (Medical): No    Lack of Transportation (Non-Medical): No  Physical Activity: Not on file  Stress: Not on file  Social Connections: Not on file  Depression (EYV7-0): Not on file  Alcohol Screen: Not on file  Housing: Not on file  Utilities: Low Risk (07/31/2024)   Received from [facility]   Utilities    Within the past 12 months, have you been unable to get utilities(heat, electricity) when it was really needed?: No  Health Literacy: Medium Risk (03/27/2023)   Received from [facility]   Health Literacy    : Rarely     Family History: The patient's family history includes Diabetes in his mother and sister; Heart  disease in his father and sister; Stroke in his father; Throat cancer in his father.  ROS:   Please see the history of present illness.    All other systems reviewed and are negative.  EKGs/Labs/Other Studies Reviewed:    The following studies were reviewed today: .SABRAEKG Interpretation Date/Time:  Tuesday August 05 2024 14:18:30 EST Ventricular Rate:  82 PR Interval:  180 QRS Duration:  118 QT Interval:  376 QTC Calculation: 439 R Axis:   -17  Text Interpretation: Normal sinus rhythm Left ventricular hypertrophy with QRS widening Abnormal ECG When compared with ECG of 22-Feb-2018 12:36, Premature atrial complexes are no longer Present ST no longer depressed in Anterolateral leads T wave inversion more evident in Inferior leads T wave inversion no longer evident in Anterolateral leads Confirmed by Edwyna Backers [phone]) on 08/05/2024 2:37:20 PM     Recent Labs: No results found for requested labs within last 365 days.  Recent Lipid Panel    Component Value Date/Time   CHOL 140 10/25/2022 0819   TRIG 105 10/25/2022 0819   HDL 48 10/25/2022 0819   CHOLHDL 2.9 10/25/2022 0819   LDLCALC 73 10/25/2022 0819     Physical Exam:    VS:  BP (!) 150/96   Pulse 82   Ht 5' 8 (1.727 m)   Wt 239 lb 8 oz (108.6 kg)   SpO2 97%   BMI 36.42 kg/m     Wt Readings from Last 3 Encounters:  08/05/24 239 lb 8 oz (108.6 kg)  04/17/23 232 lb 6.4 oz (105.4 kg)  10/11/22 236 lb 9.6 oz (107.3 kg)     GEN: Patient is in no acute distress HEENT: Normal NECK: No JVD; No carotid bruits LYMPHATICS: No lymphadenopathy CARDIAC: Hear sounds regular, 2/6 systolic murmur at the apex. RESPIRATORY:  Clear to auscultation without rales, wheezing or rhonchi  ABDOMEN: Soft, non-tender, non-distended MUSCULOSKELETAL:  No edema; No deformity  SKIN: Warm and dry NEUROLOGIC:  Alert and oriented x 3 PSYCHIATRIC:  Normal affect   Signed, Backers JONELLE Edwyna, MD  08/05/2024 2:39 PM     [facility]     [1]  Current Meds  Medication Sig   aspirin  EC 81 MG tablet Take 1 tablet (81 mg total) by mouth daily.   atorvastatin  (LIPITOR) 40 MG tablet Take 1 tablet (40 mg total) by mouth daily.   clobetasol ointment (TEMOVATE) 0.05 % Apply 1 Application topically 2 (two) times daily.   metoprolol  tartrate (LOPRESSOR ) 25 MG tablet Take 1 tablet (25 mg total) by mouth 2 (two) times daily.   pantoprazole  (PROTONIX ) 40 MG tablet Take 40 mg by mouth daily.   valsartan  (DIOVAN ) 160 MG tablet Take 1 tablet (160 mg total) by mouth daily.   "

## 2024-09-02 ENCOUNTER — Ambulatory Visit: Payer: Self-pay | Admitting: Cardiology

## 2024-09-02 ENCOUNTER — Ambulatory Visit: Attending: Cardiology

## 2024-09-02 DIAGNOSIS — Z9889 Other specified postprocedural states: Secondary | ICD-10-CM

## 2024-09-02 DIAGNOSIS — Z8679 Personal history of other diseases of the circulatory system: Secondary | ICD-10-CM | POA: Diagnosis not present

## 2024-09-02 DIAGNOSIS — I2510 Atherosclerotic heart disease of native coronary artery without angina pectoris: Secondary | ICD-10-CM | POA: Diagnosis not present

## 2024-09-02 DIAGNOSIS — I712 Thoracic aortic aneurysm, without rupture, unspecified: Secondary | ICD-10-CM | POA: Diagnosis not present

## 2024-09-02 DIAGNOSIS — I350 Nonrheumatic aortic (valve) stenosis: Secondary | ICD-10-CM | POA: Diagnosis not present

## 2024-09-02 DIAGNOSIS — Z952 Presence of prosthetic heart valve: Secondary | ICD-10-CM

## 2024-09-02 LAB — ECHOCARDIOGRAM COMPLETE
AR max vel: 1.98 cm2
AV Area VTI: 2.1 cm2
AV Area mean vel: 1.98 cm2
AV Mean grad: 9.7 mmHg
AV Peak grad: 17.8 mmHg
Ao pk vel: 2.11 m/s
Area-P 1/2: 3.88 cm2
S' Lateral: 3.7 cm

## 2024-09-04 ENCOUNTER — Other Ambulatory Visit: Payer: Self-pay | Admitting: Cardiology

## 2024-09-05 ENCOUNTER — Ambulatory Visit (HOSPITAL_BASED_OUTPATIENT_CLINIC_OR_DEPARTMENT_OTHER)
Admission: RE | Admit: 2024-09-05 | Discharge: 2024-09-05 | Disposition: A | Source: Ambulatory Visit | Attending: Cardiology | Admitting: Cardiology

## 2024-09-05 DIAGNOSIS — Z8679 Personal history of other diseases of the circulatory system: Secondary | ICD-10-CM

## 2024-09-05 DIAGNOSIS — I712 Thoracic aortic aneurysm, without rupture, unspecified: Secondary | ICD-10-CM
